# Patient Record
Sex: Female | Born: 1995 | Race: White | Hispanic: No | Marital: Married | Smoking: Never smoker
Health system: Southern US, Community
[De-identification: ages and names within clinical notes are randomized; demographics above are authoritative.]

## PROBLEM LIST (undated history)

## (undated) ENCOUNTER — Emergency Department: Discharge status: Absent without leave | Payer: BC Managed Care – PPO

## (undated) DIAGNOSIS — E538 Deficiency of other specified B group vitamins: Secondary | ICD-10-CM

## (undated) DIAGNOSIS — N9489 Other specified conditions associated with female genital organs and menstrual cycle: Secondary | ICD-10-CM

## (undated) DIAGNOSIS — K219 Gastro-esophageal reflux disease without esophagitis: Secondary | ICD-10-CM

## (undated) DIAGNOSIS — N12 Tubulo-interstitial nephritis, not specified as acute or chronic: Secondary | ICD-10-CM

## (undated) DIAGNOSIS — J069 Acute upper respiratory infection, unspecified: Secondary | ICD-10-CM

## (undated) DIAGNOSIS — F419 Anxiety disorder, unspecified: Secondary | ICD-10-CM

## (undated) DIAGNOSIS — J45909 Unspecified asthma, uncomplicated: Secondary | ICD-10-CM

## (undated) DIAGNOSIS — M84353A Stress fracture, unspecified femur, initial encounter for fracture: Secondary | ICD-10-CM

## (undated) DIAGNOSIS — G43909 Migraine, unspecified, not intractable, without status migrainosus: Secondary | ICD-10-CM

## (undated) DIAGNOSIS — Q6589 Other specified congenital deformities of hip: Secondary | ICD-10-CM

## (undated) DIAGNOSIS — L509 Urticaria, unspecified: Secondary | ICD-10-CM

## (undated) DIAGNOSIS — F909 Attention-deficit hyperactivity disorder, unspecified type: Secondary | ICD-10-CM

## (undated) HISTORY — PX: OTHER SURGICAL HISTORY: SHX169

## (undated) HISTORY — PX: TYMPANOSTOMY TUBE PLACEMENT: SHX32

## (undated) HISTORY — PX: TONSILLECTOMY: SUR1361

## (undated) HISTORY — DX: Acute upper respiratory infection, unspecified: J06.9

## (undated) HISTORY — PX: ADENOIDECTOMY: SUR15

## (undated) HISTORY — PX: HIP SURGERY: SHX245

## (undated) HISTORY — DX: Other specified conditions associated with female genital organs and menstrual cycle: N94.89

## (undated) HISTORY — DX: Unspecified asthma, uncomplicated: J45.909

## (undated) HISTORY — DX: Urticaria, unspecified: L50.9

---
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
## 2017-02-15 ENCOUNTER — Other Ambulatory Visit: Payer: Self-pay | Admitting: Obstetrics and Gynecology

## 2017-05-16 ENCOUNTER — Encounter: Payer: Self-pay | Admitting: Emergency Medicine

## 2017-05-16 ENCOUNTER — Inpatient Hospital Stay
Admission: EM | Admit: 2017-05-16 | Discharge: 2017-05-20 | DRG: 872 | Disposition: A | Discharge status: Home / Self Care | Payer: BC Managed Care – PPO | Attending: Internal Medicine | Admitting: Internal Medicine

## 2017-05-16 ENCOUNTER — Emergency Department: Payer: BC Managed Care – PPO

## 2017-05-16 DIAGNOSIS — Z7951 Long term (current) use of inhaled steroids: Secondary | ICD-10-CM

## 2017-05-16 DIAGNOSIS — J45909 Unspecified asthma, uncomplicated: Secondary | ICD-10-CM | POA: Diagnosis present

## 2017-05-16 DIAGNOSIS — Z79899 Other long term (current) drug therapy: Secondary | ICD-10-CM

## 2017-05-16 DIAGNOSIS — Z833 Family history of diabetes mellitus: Secondary | ICD-10-CM

## 2017-05-16 DIAGNOSIS — T8089XA Other complications following infusion, transfusion and therapeutic injection, initial encounter: Secondary | ICD-10-CM | POA: Diagnosis not present

## 2017-05-16 DIAGNOSIS — Z793 Long term (current) use of hormonal contraceptives: Secondary | ICD-10-CM

## 2017-05-16 DIAGNOSIS — A419 Sepsis, unspecified organism: Secondary | ICD-10-CM | POA: Diagnosis not present

## 2017-05-16 DIAGNOSIS — Z9089 Acquired absence of other organs: Secondary | ICD-10-CM

## 2017-05-16 DIAGNOSIS — N10 Acute tubulo-interstitial nephritis: Secondary | ICD-10-CM | POA: Diagnosis present

## 2017-05-16 DIAGNOSIS — Y711 Therapeutic (nonsurgical) and rehabilitative cardiovascular devices associated with adverse incidents: Secondary | ICD-10-CM | POA: Diagnosis present

## 2017-05-16 DIAGNOSIS — K5900 Constipation, unspecified: Secondary | ICD-10-CM | POA: Diagnosis present

## 2017-05-16 DIAGNOSIS — F419 Anxiety disorder, unspecified: Secondary | ICD-10-CM | POA: Diagnosis present

## 2017-05-16 DIAGNOSIS — R109 Unspecified abdominal pain: Secondary | ICD-10-CM

## 2017-05-16 DIAGNOSIS — Z880 Allergy status to penicillin: Secondary | ICD-10-CM

## 2017-05-16 DIAGNOSIS — N12 Tubulo-interstitial nephritis, not specified as acute or chronic: Secondary | ICD-10-CM | POA: Diagnosis present

## 2017-05-16 DIAGNOSIS — Y92230 Patient room in hospital as the place of occurrence of the external cause: Secondary | ICD-10-CM | POA: Diagnosis present

## 2017-05-16 DIAGNOSIS — F909 Attention-deficit hyperactivity disorder, unspecified type: Secondary | ICD-10-CM | POA: Diagnosis present

## 2017-05-16 DIAGNOSIS — G43909 Migraine, unspecified, not intractable, without status migrainosus: Secondary | ICD-10-CM | POA: Diagnosis present

## 2017-05-16 DIAGNOSIS — N1330 Unspecified hydronephrosis: Secondary | ICD-10-CM

## 2017-05-16 HISTORY — DX: Migraine, unspecified, not intractable, without status migrainosus: G43.909

## 2017-05-16 HISTORY — DX: Anxiety disorder, unspecified: F41.9

## 2017-05-16 HISTORY — DX: Attention-deficit hyperactivity disorder, unspecified type: F90.9

## 2017-05-16 LAB — COMPREHENSIVE METABOLIC PANEL
ALBUMIN: 3.9 g/dL (ref 3.5–5.0)
ALK PHOS: 57 U/L (ref 38–126)
ALT: 13 U/L — ABNORMAL LOW (ref 14–54)
ANION GAP: 7 (ref 5–15)
AST: 17 U/L (ref 15–41)
BILIRUBIN TOTAL: 0.3 mg/dL (ref 0.3–1.2)
BUN: 11 mg/dL (ref 6–20)
CALCIUM: 8.9 mg/dL (ref 8.9–10.3)
CO2: 26 mmol/L (ref 22–32)
Chloride: 105 mmol/L (ref 101–111)
Creatinine, Ser: 0.66 mg/dL (ref 0.44–1.00)
GFR calc non Af Amer: >60 mL/min (ref >60)
GLUCOSE: 89 mg/dL (ref 65–99)
Potassium: 3.6 mmol/L (ref 3.5–5.1)
Sodium: 138 mmol/L (ref 135–145)
TOTAL PROTEIN: 7.5 g/dL (ref 6.5–8.1)

## 2017-05-16 LAB — URINALYSIS, COMPLETE (UACMP) WITH MICROSCOPIC
Bilirubin Urine: NEGATIVE
Glucose, UA: NEGATIVE mg/dL
KETONES UR: NEGATIVE mg/dL
Leukocytes, UA: NEGATIVE
NITRITE: NEGATIVE
PH: 7 (ref 5.0–8.0)
PROTEIN: NEGATIVE mg/dL
SPECIFIC GRAVITY, URINE: 1.013 (ref 1.005–1.030)

## 2017-05-16 LAB — CBC
HCT: 34.7 % — ABNORMAL LOW (ref 35.0–47.0)
HEMOGLOBIN: 11.6 g/dL — AB (ref 12.0–16.0)
MCH: 29.7 pg (ref 26.0–34.0)
MCHC: 33.5 g/dL (ref 32.0–36.0)
MCV: 88.6 fL (ref 80.0–100.0)
Platelets: 217 10*3/uL (ref 150–440)
RBC: 3.92 MIL/uL (ref 3.80–5.20)
RDW: 13.7 % (ref 11.5–14.5)
WBC: 9.9 10*3/uL (ref 3.6–11.0)

## 2017-05-16 LAB — POCT PREGNANCY, URINE: PREG TEST UR: NEGATIVE

## 2017-05-16 LAB — LIPASE, BLOOD: Lipase: 22 U/L (ref 11–51)

## 2017-05-16 MED ORDER — ONDANSETRON 4 MG PO TBDP
4.0000 mg | ORAL_TABLET | Freq: Once | ORAL | Status: AC
  Administered 2017-05-16: 4 mg via ORAL

## 2017-05-16 MED ORDER — OXYCODONE-ACETAMINOPHEN 5-325 MG PO TABS
1.0000 | ORAL_TABLET | ORAL | Status: AC | PRN
  Administered 2017-05-16 (×2): 1 via ORAL
  Filled 2017-05-16 (×2): qty 1

## 2017-05-16 MED ORDER — DICYCLOMINE HCL 10 MG/ML IM SOLN
20.0000 mg | Freq: Once | INTRAMUSCULAR | Status: AC
  Administered 2017-05-16: 20 mg via INTRAMUSCULAR
  Filled 2017-05-16: qty 2

## 2017-05-16 MED ORDER — ONDANSETRON 4 MG PO TBDP
4.0000 mg | ORAL_TABLET | Freq: Once | ORAL | Status: AC | PRN
  Administered 2017-05-16: 4 mg via ORAL
  Filled 2017-05-16: qty 1

## 2017-05-16 MED ORDER — ONDANSETRON 4 MG PO TBDP
ORAL_TABLET | ORAL | Status: AC
  Filled 2017-05-16: qty 1

## 2017-05-16 NOTE — ED Triage Notes (Signed)
Patient with complaint of intermittent right lower abdominal pain that started on Sunday. Patient reports vomiting times one this morning. Patient denies diarrhea or urinary symptoms.

## 2017-05-16 NOTE — ED Provider Notes (Signed)
[facility] Emergency Department Provider Note   ____________________________________________   I have reviewed the triage vital signs and the nursing notes.   HISTORY  Chief Complaint Abdominal Pain   History limited by: Not Limited   HPI Angela Rocha is a 22 y.o. female who presents to the emergency department today because of concern for right sided abdominal pain. Patient states that the pain started 2 days ago. Since that time it has waxed and waned. At the time of my examination the patient states that it is severe. The patient states she has had some associated nausea and vomiting. She denies any change in defecation or urination. No dysuria. No fevers.   Per medical record review patient has a history of asthma.   Past Medical History:  Diagnosis Date  . ADHD   . Anxiety   . Asthma   . Migraine   . Pelvic congestion syndrome     Patient Active Problem List   Diagnosis Date Noted  . 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 Vitamin D deficiency 01/12/2016  . B12 deficiency 01/12/2016  . 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 Quadriceps tendinitis 01/06/2015  . Femoral neck stress fracture 01/06/2015  . 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 Cephalalgia 03/12/2014    Past Surgical History:  Procedure Laterality Date  . HIP SURGERY    . TONSILLECTOMY      Prior to Admission medications   Medication Sig Start Date End Date Taking? Authorizing Provider  albuterol (PROVENTIL HFA;VENTOLIN HFA) 108 (90 Base) MCG/ACT inhaler Inhale 2 puffs into the lungs every 6 (six) hours as needed for wheezing or shortness of breath. 05/10/16   Collene Gobble, MD  budesonide-formoterol (SYMBICORT) 160-4.5 MCG/ACT inhaler Inhale 2 puffs into the lungs 2 (two) times daily. 05/12/16   Collene Gobble, MD  Butalbital-APAP-Caffeine [id number] MG capsule Reported on 02/04/2015 10/31/14   [provider]  cyanocobalamin (,VITAMIN B-12,) 1000 MCG/ML injection  Inject 1 mL (1,000 mcg total) into the muscle every 30 (thirty) days. 01/12/16   Shambley, Melody N, CNM  FLUoxetine (PROZAC) 10 MG tablet Take 10 mg by mouth daily.    [provider]  lisdexamfetamine (VYVANSE) 20 MG capsule Take by mouth. 12/12/14   [provider]  TAYTULLA 1-20 MG-MCG(24) CAPS TAKE 1 TABLET BY MOUTH DAILY. 02/15/17   Shambley, Melody N, CNM  tranexamic acid (LYSTEDA) 650 MG TABS tablet Take 2 tablets (1,300 mg total) by mouth 3 (three) times daily. Take during menses for a maximum of five days 01/07/16   Shambley, Melody N, CNM  Vitamin D, Ergocalciferol, (DRISDOL) 50000 units CAPS capsule Take 1 capsule (50,000 Units total) by mouth every 7 (seven) days. 01/12/16   Shambley, Melody N, CNM    Allergies Amoxicillin-pot clavulanate  No family history on file.  Social History Social History   Tobacco Use  . Smoking status: Never Smoker  . Smokeless tobacco: Never Used  Substance Use Topics  . Alcohol use: No    Alcohol/week: 0.0 oz  . Drug use: No    Review of Systems Constitutional: No fever/chills Eyes: No visual changes. ENT: No sore throat. Cardiovascular: Denies chest pain. Respiratory: Denies shortness of breath. Gastrointestinal: Positive for abdominal pain and nausea and vomiting. Genitourinary: Negative for dysuria. Musculoskeletal: Negative for back pain. Skin: Negative for rash. Neurological: Negative for headaches, focal weakness or numbness.  ____________________________________________   PHYSICAL EXAM:  VITAL SIGNS: ED Triage Vitals  Enc Vitals Group  BP 05/16/17 1936 100/63     Pulse Rate 05/16/17 1934 (!) 107     Resp 05/16/17 1934 18     Temp 05/16/17 1934 98.5 F (36.9 C)     Temp Source 05/16/17 1934 Oral     SpO2 05/16/17 1934 100 %     Weight 05/16/17 1935 130 lb (59 kg)     Height 05/16/17 1935 5\' 3"  (1.6 m)     Head Circumference --      Peak Flow --      Pain Score 05/16/17 1935 7   Constitutional:  Alert and oriented. Appears uncomfortable.  Eyes: Conjunctivae are normal.  ENT   Head: Normocephalic and atraumatic.   Nose: No congestion/rhinnorhea.   Mouth/Throat: Mucous membranes are moist.   Neck: No stridor. Hematological/Lymphatic/Immunilogical: No cervical lymphadenopathy. Cardiovascular: Normal rate, regular rhythm.  No murmurs, rubs, or gallops.  Respiratory: Normal respiratory effort without tachypnea nor retractions. Breath sounds are clear and equal bilaterally. No wheezes/rales/rhonchi. Gastrointestinal: Soft and tender in the right upper abdomen.  Genitourinary: Deferred Musculoskeletal: Normal range of motion in all extremities. No lower extremity edema. Neurologic:  Normal speech and language. No gross focal neurologic deficits are appreciated.  Skin:  Skin is warm, dry and intact. No rash noted. Psychiatric: Mood and affect are normal. Speech and behavior are normal. Patient exhibits appropriate insight and judgment.  ____________________________________________    LABS (pertinent positives/negatives)  Upreg negative Lipase 22 CMP wnl except ALT 13 UA clear, moderate urine, negative leukocytes, 11-20 rbcs, 6-10 wbcs few bacteria ____________________________________________   EKG  None  ____________________________________________    RADIOLOGY  [place] pending at time of sign out  ____________________________________________   PROCEDURES  Procedures  ____________________________________________   INITIAL IMPRESSION / ASSESSMENT AND PLAN / ED COURSE  Pertinent labs & imaging results that were available during my care of the patient were reviewed by me and considered in my medical decision making (see chart for details).  Patient presented to the emergency department today because of concerns for right sided abdominal pain.  Has been present for the past 2 days.  On exam the patient is tender in the right side of her abdomen however is  more tender in the upper right side in the lower right side.  Patient is afebrile and without leukocytosis on blood work.  Differentials would include appendicitis, cholycystitis or biliary colic, gastritis, urinary tract infection amongst other etiologies.  Had discussion with patient and family.  At this point given that the pain is primarily located in the right upper quadrant is associated with nausea vomiting will start with ultrasound.  Did discuss the possibility of kind CT scan if that was negative and patient continued to have pain after medication.   ____________________________________________   FINAL CLINICAL IMPRESSION(S) / ED DIAGNOSES  Right upper abdominal pain  Note: This dictation was prepared with Dragon dictation. Any transcriptional errors that result from this process are unintentional      Nance Pear, MD 05/17/17 [phone]

## 2017-05-17 ENCOUNTER — Emergency Department: Payer: BC Managed Care – PPO

## 2017-05-17 ENCOUNTER — Other Ambulatory Visit: Payer: Self-pay

## 2017-05-17 DIAGNOSIS — T8089XA Other complications following infusion, transfusion and therapeutic injection, initial encounter: Secondary | ICD-10-CM | POA: Diagnosis not present

## 2017-05-17 DIAGNOSIS — F909 Attention-deficit hyperactivity disorder, unspecified type: Secondary | ICD-10-CM | POA: Diagnosis present

## 2017-05-17 DIAGNOSIS — Y711 Therapeutic (nonsurgical) and rehabilitative cardiovascular devices associated with adverse incidents: Secondary | ICD-10-CM | POA: Diagnosis present

## 2017-05-17 DIAGNOSIS — F419 Anxiety disorder, unspecified: Secondary | ICD-10-CM | POA: Diagnosis present

## 2017-05-17 DIAGNOSIS — Z880 Allergy status to penicillin: Secondary | ICD-10-CM | POA: Diagnosis not present

## 2017-05-17 DIAGNOSIS — A419 Sepsis, unspecified organism: Secondary | ICD-10-CM | POA: Diagnosis present

## 2017-05-17 DIAGNOSIS — Z7951 Long term (current) use of inhaled steroids: Secondary | ICD-10-CM | POA: Diagnosis not present

## 2017-05-17 DIAGNOSIS — N12 Tubulo-interstitial nephritis, not specified as acute or chronic: Secondary | ICD-10-CM | POA: Diagnosis present

## 2017-05-17 DIAGNOSIS — G43909 Migraine, unspecified, not intractable, without status migrainosus: Secondary | ICD-10-CM | POA: Diagnosis present

## 2017-05-17 DIAGNOSIS — Y92230 Patient room in hospital as the place of occurrence of the external cause: Secondary | ICD-10-CM | POA: Diagnosis present

## 2017-05-17 DIAGNOSIS — Z793 Long term (current) use of hormonal contraceptives: Secondary | ICD-10-CM | POA: Diagnosis not present

## 2017-05-17 DIAGNOSIS — K5900 Constipation, unspecified: Secondary | ICD-10-CM | POA: Diagnosis present

## 2017-05-17 DIAGNOSIS — Z833 Family history of diabetes mellitus: Secondary | ICD-10-CM | POA: Diagnosis not present

## 2017-05-17 DIAGNOSIS — N10 Acute tubulo-interstitial nephritis: Secondary | ICD-10-CM | POA: Diagnosis present

## 2017-05-17 DIAGNOSIS — J45909 Unspecified asthma, uncomplicated: Secondary | ICD-10-CM | POA: Diagnosis present

## 2017-05-17 DIAGNOSIS — Z79899 Other long term (current) drug therapy: Secondary | ICD-10-CM | POA: Diagnosis not present

## 2017-05-17 DIAGNOSIS — Z9089 Acquired absence of other organs: Secondary | ICD-10-CM | POA: Diagnosis not present

## 2017-05-17 LAB — GLUCOSE, CAPILLARY: Glucose-Capillary: 86 mg/dL (ref 65–99)

## 2017-05-17 LAB — TSH: TSH: 1.345 u[IU]/mL (ref 0.350–4.500)

## 2017-05-17 MED ORDER — IOPAMIDOL (ISOVUE-300) INJECTION 61%
30.0000 mL | Freq: Once | INTRAVENOUS | Status: AC
  Administered 2017-05-17: 30 mL via ORAL

## 2017-05-17 MED ORDER — VITAMIN D (ERGOCALCIFEROL) 1.25 MG (50000 UNIT) PO CAPS
50000.0000 [IU] | ORAL_CAPSULE | ORAL | Status: DC
  Administered 2017-05-19: 50000 [IU] via ORAL
  Filled 2017-05-17: qty 1

## 2017-05-17 MED ORDER — NORTRIPTYLINE HCL 10 MG PO CAPS
20.0000 mg | ORAL_CAPSULE | Freq: Every day | ORAL | Status: DC
  Administered 2017-05-17 – 2017-05-19 (×3): 20 mg via ORAL
  Filled 2017-05-17 (×4): qty 2

## 2017-05-17 MED ORDER — ONDANSETRON HCL 4 MG/2ML IJ SOLN
4.0000 mg | Freq: Four times a day (QID) | INTRAMUSCULAR | Status: DC | PRN
  Administered 2017-05-20: 4 mg via INTRAVENOUS
  Filled 2017-05-17: qty 2

## 2017-05-17 MED ORDER — LORATADINE 10 MG PO TABS
10.0000 mg | ORAL_TABLET | Freq: Every day | ORAL | Status: DC
  Administered 2017-05-17 – 2017-05-20 (×4): 10 mg via ORAL
  Filled 2017-05-17 (×4): qty 1

## 2017-05-17 MED ORDER — FLUTICASONE FUROATE-VILANTEROL 200-25 MCG/INH IN AEPB
1.0000 | INHALATION_SPRAY | Freq: Every day | RESPIRATORY_TRACT | Status: DC
  Administered 2017-05-17 – 2017-05-20 (×4): 1 via RESPIRATORY_TRACT
  Filled 2017-05-17: qty 28

## 2017-05-17 MED ORDER — SODIUM CHLORIDE 0.9 % IV BOLUS
1000.0000 mL | Freq: Once | INTRAVENOUS | Status: AC
  Administered 2017-05-17: 1000 mL via INTRAVENOUS

## 2017-05-17 MED ORDER — NORETHIN ACE-ETH ESTRAD-FE 1-20 MG-MCG(24) PO CAPS
1.0000 | ORAL_CAPSULE | Freq: Every day | ORAL | Status: DC
  Administered 2017-05-18 – 2017-05-19 (×2): 1 via ORAL

## 2017-05-17 MED ORDER — ENOXAPARIN SODIUM 40 MG/0.4ML ~~LOC~~ SOLN
40.0000 mg | SUBCUTANEOUS | Status: DC
  Filled 2017-05-17 (×5): qty 0.4

## 2017-05-17 MED ORDER — ALBUTEROL SULFATE (2.5 MG/3ML) 0.083% IN NEBU: 2.5000 mg | INHALATION_SOLUTION | RESPIRATORY_TRACT | Status: DC | PRN

## 2017-05-17 MED ORDER — MORPHINE SULFATE (PF) 4 MG/ML IV SOLN
4.0000 mg | Freq: Once | INTRAVENOUS | Status: AC
  Administered 2017-05-17: 4 mg via INTRAVENOUS

## 2017-05-17 MED ORDER — MORPHINE SULFATE (PF) 4 MG/ML IV SOLN
INTRAVENOUS | Status: AC
  Filled 2017-05-17: qty 1

## 2017-05-17 MED ORDER — IOPAMIDOL (ISOVUE-370) INJECTION 76%
75.0000 mL | Freq: Once | INTRAVENOUS | Status: AC | PRN
  Administered 2017-05-17: 75 mL via INTRAVENOUS

## 2017-05-17 MED ORDER — RIZATRIPTAN BENZOATE 10 MG PO TBDP
10.0000 mg | ORAL_TABLET | ORAL | Status: DC
  Filled 2017-05-17: qty 1

## 2017-05-17 MED ORDER — LEVOFLOXACIN IN D5W 500 MG/100ML IV SOLN
500.0000 mg | Freq: Once | INTRAVENOUS | Status: AC
  Administered 2017-05-17: 500 mg via INTRAVENOUS
  Filled 2017-05-17: qty 100

## 2017-05-17 MED ORDER — TRANEXAMIC ACID 650 MG PO TABS
1300.0000 mg | ORAL_TABLET | Freq: Three times a day (TID) | ORAL | Status: DC
  Administered 2017-05-17 – 2017-05-18 (×5): 1300 mg via ORAL
  Filled 2017-05-17 (×12): qty 2

## 2017-05-17 MED ORDER — SODIUM CHLORIDE 0.9 % IV SOLN
INTRAVENOUS | Status: DC
  Administered 2017-05-17 (×2): via INTRAVENOUS

## 2017-05-17 MED ORDER — ACETAMINOPHEN 650 MG RE SUPP: 650.0000 mg | Freq: Four times a day (QID) | RECTAL | Status: DC | PRN

## 2017-05-17 MED ORDER — SUMATRIPTAN SUCCINATE 50 MG PO TABS
100.0000 mg | ORAL_TABLET | ORAL | Status: DC | PRN
  Administered 2017-05-17: 100 mg via ORAL
  Filled 2017-05-17: qty 2

## 2017-05-17 MED ORDER — FLUOXETINE HCL 20 MG PO CAPS
20.0000 mg | ORAL_CAPSULE | Freq: Every day | ORAL | Status: DC
  Administered 2017-05-17 – 2017-05-20 (×4): 20 mg via ORAL
  Filled 2017-05-17 (×4): qty 1

## 2017-05-17 MED ORDER — DOCUSATE SODIUM 100 MG PO CAPS
100.0000 mg | ORAL_CAPSULE | Freq: Two times a day (BID) | ORAL | Status: DC
  Administered 2017-05-17 – 2017-05-19 (×6): 100 mg via ORAL
  Filled 2017-05-17 (×7): qty 1

## 2017-05-17 MED ORDER — MORPHINE SULFATE (PF) 4 MG/ML IV SOLN
4.0000 mg | Freq: Once | INTRAVENOUS | Status: AC
  Administered 2017-05-17: 4 mg via INTRAVENOUS
  Filled 2017-05-17: qty 1

## 2017-05-17 MED ORDER — ACETAMINOPHEN 325 MG PO TABS
650.0000 mg | ORAL_TABLET | Freq: Four times a day (QID) | ORAL | Status: DC | PRN
  Administered 2017-05-17 – 2017-05-18 (×4): 650 mg via ORAL
  Filled 2017-05-17 (×4): qty 2

## 2017-05-17 MED ORDER — IOPAMIDOL (ISOVUE-300) INJECTION 61%: 30.0000 mL | Freq: Once | INTRAVENOUS | Status: DC | PRN

## 2017-05-17 MED ORDER — MORPHINE SULFATE (PF) 2 MG/ML IV SOLN
2.0000 mg | INTRAVENOUS | Status: DC | PRN
  Administered 2017-05-17 (×4): 2 mg via INTRAVENOUS
  Filled 2017-05-17 (×4): qty 1

## 2017-05-17 MED ORDER — HYDROCODONE-ACETAMINOPHEN 5-325 MG PO TABS
1.0000 | ORAL_TABLET | ORAL | Status: DC | PRN
  Administered 2017-05-18 – 2017-05-19 (×4): 1 via ORAL
  Filled 2017-05-17 (×4): qty 1

## 2017-05-17 MED ORDER — ONDANSETRON HCL 4 MG PO TABS: 4.0000 mg | ORAL_TABLET | Freq: Four times a day (QID) | ORAL | Status: DC | PRN

## 2017-05-17 MED ORDER — LEVOFLOXACIN IN D5W 750 MG/150ML IV SOLN
750.0000 mg | INTRAVENOUS | Status: DC
  Administered 2017-05-18 – 2017-05-19 (×2): 750 mg via INTRAVENOUS
  Filled 2017-05-17 (×2): qty 150

## 2017-05-17 MED ORDER — KETOROLAC TROMETHAMINE 30 MG/ML IJ SOLN
15.0000 mg | Freq: Once | INTRAMUSCULAR | Status: AC
  Administered 2017-05-17: 15 mg via INTRAVENOUS
  Filled 2017-05-17: qty 1

## 2017-05-17 MED ORDER — RIZATRIPTAN BENZOATE 10 MG PO TABS
10.0000 mg | ORAL_TABLET | Freq: Once | ORAL | Status: DC | PRN
  Filled 2017-05-17: qty 1

## 2017-05-17 NOTE — H&P (Signed)
Angela Rocha is an 22 y.o. female.   Chief Complaint: Abdominal pain HPI: The patient with past medical history of asthma and ADHD as well as hip joint arthritis and pelvic congestion syndrome presents to the emergency department complaining of abdominal pain.  The pain has worsened over the last few days.  The patient was diagnosed with a UTI last week placed on antibiotics.  CT of her abdomen revealed pyelonephritis.  Patient received multiple doses of pain medicine without significant relief which prompted the emergency department staff to call the hospitalist service for admission.  Past Medical History:  Diagnosis Date  . ADHD   . Anxiety   . Asthma   . Migraine   . Pelvic congestion syndrome     Past Surgical History:  Procedure Laterality Date  . HIP SURGERY    . TONSILLECTOMY      Family History  Problem Relation Age of Onset  . Diabetes Mellitus II Father    Social History:  reports that she has never smoked. She has never used smokeless tobacco. She reports that she does not drink alcohol or use drugs.  Allergies:  Allergies  Allergen Reactions  . Amoxicillin-Pot Clavulanate Rash and Other (See Comments)    Has patient had a PCN reaction causing immediate rash, facial/tongue/throat swelling, SOB or lightheadedness with hypotension: Yes Has patient had a PCN reaction causing severe rash involving mucus membranes or skin necrosis: No Has patient had a PCN reaction that required hospitalization: No Has patient had a PCN reaction occurring within the last 10 years: No If all of the above answers are "NO", then may proceed with Cephalosporin use.     Medications Prior to Admission  Medication Sig Dispense Refill  . albuterol (PROVENTIL HFA;VENTOLIN HFA) 108 (90 Base) MCG/ACT inhaler Inhale 2 puffs into the lungs every 6 (six) hours as needed for wheezing or shortness of breath. 1 Inhaler 4  . budesonide-formoterol (SYMBICORT) 160-4.5 MCG/ACT inhaler Inhale 2 puffs into  the lungs 2 (two) times daily. 1 Inhaler 5  . cetirizine (ZYRTEC) 10 MG tablet Take 10 mg by mouth daily.  3  . cyanocobalamin (,VITAMIN B-12,) 1000 MCG/ML injection Inject 1 mL (1,000 mcg total) into the muscle every 30 (thirty) days. 10 mL 1  . dexmethylphenidate (FOCALIN) 2.5 MG tablet Take 2.5 mg by mouth daily.    Marland Kitchen FLUoxetine (PROZAC) 20 MG capsule Take 20 mg by mouth daily.   0  . lisdexamfetamine (VYVANSE) 20 MG capsule Take 20 mg by mouth daily.     . nortriptyline (PAMELOR) 10 MG capsule Take 20 mg by mouth at bedtime.  3  . rizatriptan (MAXALT-MLT) 10 MG disintegrating tablet Take 10 mg by mouth as directed. TAKE 1 TAB AT HEADACHE ONSET MAY TAKE A 2ND DOSE AFTER 2 HOURS IF NEEDED NO MORE THAN 2 DOSES/24 HRS  3  . TAYTULLA 1-20 MG-MCG(24) CAPS TAKE 1 TABLET BY MOUTH DAILY. 28 capsule 11  . tranexamic acid (LYSTEDA) 650 MG TABS tablet Take 2 tablets (1,300 mg total) by mouth 3 (three) times daily. Take during menses for a maximum of five days 30 tablet 2  . Vitamin D, Ergocalciferol, (DRISDOL) 50000 units CAPS capsule Take 1 capsule (50,000 Units total) by mouth every 7 (seven) days. (Patient taking differently: Take 50,000 Units by mouth every 7 (seven) days. On Friday) 30 capsule 1    Results for orders placed or performed during the hospital encounter of 05/16/17 (from the past 48 hour(s))  Lipase, blood  Status: None   Collection Time: 05/16/17  7:39 PM  Result Value Ref Range   Lipase 22 11 - 51 U/L    Comment: Performed at [facility], [address] [place], [place] [address]  Comprehensive metabolic panel     Status: Abnormal   Collection Time: 05/16/17  7:39 PM  Result Value Ref Range   Sodium 138 135 - 145 mmol/L   Potassium 3.6 3.5 - 5.1 mmol/L   Chloride 105 101 - 111 mmol/L   CO2 26 22 - 32 mmol/L   Glucose, Bld 89 65 - 99 mg/dL   BUN 11 6 - 20 mg/dL   Creatinine, Ser 0.66 0.44 - 1.00 mg/dL   Calcium 8.9 8.9 - 10.3 mg/dL   Total Protein 7.5 6.5 - 8.1  g/dL   Albumin 3.9 3.5 - 5.0 g/dL   AST 17 15 - 41 U/L   ALT 13 (L) 14 - 54 U/L   Alkaline Phosphatase 57 38 - 126 U/L   Total Bilirubin 0.3 0.3 - 1.2 mg/dL   GFR calc non Af Amer >60 >60 mL/min   GFR calc Af Amer >60 >60 mL/min    Comment: (NOTE) The eGFR has been calculated using the CKD EPI equation. This calculation has not been validated in all clinical situations. eGFR's persistently <60 mL/min signify possible Chronic Kidney Disease.    Anion gap 7 5 - 15    Comment: Performed at [facility], [address] [place], [place] [address]  CBC     Status: Abnormal   Collection Time: 05/16/17  7:39 PM  Result Value Ref Range   WBC 9.9 3.6 - 11.0 K/uL   RBC 3.92 3.80 - 5.20 MIL/uL   Hemoglobin 11.6 (L) 12.0 - 16.0 g/dL   HCT 34.7 (L) 35.0 - 47.0 %   MCV 88.6 80.0 - 100.0 fL   MCH 29.7 26.0 - 34.0 pg   MCHC 33.5 32.0 - 36.0 g/dL   RDW 13.7 11.5 - 14.5 %   Platelets 217 150 - 440 K/uL    Comment: Performed at [facility], [address] [place], [place] [address]  Urinalysis, Complete w Microscopic     Status: Abnormal   Collection Time: 05/16/17  7:39 PM  Result Value Ref Range   Color, Urine YELLOW (A) YELLOW   APPearance CLEAR (A) CLEAR   Specific Gravity, Urine 1.013 1.005 - 1.030   pH 7.0 5.0 - 8.0   Glucose, UA NEGATIVE NEGATIVE mg/dL   Hgb urine dipstick MODERATE (A) NEGATIVE   Bilirubin Urine NEGATIVE NEGATIVE   Ketones, ur NEGATIVE NEGATIVE mg/dL   Protein, ur NEGATIVE NEGATIVE mg/dL   Nitrite NEGATIVE NEGATIVE   Leukocytes, UA NEGATIVE NEGATIVE   RBC / HPF 11-20 0 - 5 RBC/hpf   WBC, UA 6-10 0 - 5 WBC/hpf   Bacteria, UA FEW (A) NONE SEEN   Squamous Epithelial / LPF 0-5 0 - 5    Comment: Please note change in reference range. Performed at [facility], [address] [place], [place] [address]   Pregnancy, urine POC     Status: None   Collection Time: 05/16/17  7:51 PM  Result Value Ref Range   Preg Test, Ur NEGATIVE  NEGATIVE    Comment:        THE SENSITIVITY OF THIS METHODOLOGY IS >24 mIU/mL    Ct Abdomen Pelvis W Contrast  Result Date: 05/17/2017 CLINICAL DATA:  Initial evaluation for acute abdominal pain,  appendicitis suspected. EXAM: CT ABDOMEN AND PELVIS WITH CONTRAST TECHNIQUE: Multidetector CT imaging of the abdomen and pelvis was performed using the standard protocol following bolus administration of intravenous contrast. CONTRAST:  76m ISOVUE-370 IOPAMIDOL (ISOVUE-370) INJECTION 76% COMPARISON:  Prior ultrasound from 05/16/2017. FINDINGS: Lower chest: Minimal atelectatic changes present within the lingula. Visualized lung bases are otherwise clear. Hepatobiliary: Liver demonstrates a normal contrast enhanced appearance. Gallbladder normal. No biliary dilatation. Pancreas: Pancreas normal. Spleen: Normal. Adrenals/Urinary Tract: Adrenal glands are normal. There is slightly delayed and striated nephrogram within the right kidney is compared to the left with hazy perinephric and periureteral fat stranding. Right renal collecting system slightly dilated with associated mucosal enhancement. Findings consistent with acute pyelonephritis. No intrarenal abscess or other collection. No obstructive stone identified. Left kidney and renal collecting system are within normal limits. Bladder partially distended. Circumferential bladder wall thickening may be related incomplete distension and/or acute cystitis. No layering stones within the bladder lumen. Stomach/Bowel: Stomach within normal limits. No evidence for bowel obstruction. Appendix visualized within the right lower quadrant and is of normal caliber and appearance without associated inflammatory changes to suggest acute appendicitis. No acute inflammatory changes seen about the bowels. Vascular/Lymphatic: Normal intravascular enhancement seen throughout the intra-abdominal aorta and its branch vessels. No adenopathy. Reproductive: Uterus and ovaries within normal  limits for age. Other: No free air or fluid. Musculoskeletal: No acute osseous abnormality. No worrisome lytic or blastic osseous lesions. IMPRESSION: 1. Findings highly suggestive for acute pyelonephritis as above. Correlation with urinalysis recommended. No obstructive stone identified. 2. Circumferential bladder wall thickening. While this finding may be related to incomplete distension, possible acute cystitis could also be considered, particularly given the findings in the right kidney. 3. No other acute intra-abdominal or pelvic process. Normal appendix. Electronically Signed   By: BJeannine BogaM.D.   On: 05/17/2017 04:28   UKoreaAbdomen Limited Ruq  Result Date: 05/17/2017 CLINICAL DATA:  Initial evaluation for acute right upper and lower quadrant abdominal pain for 3 days. EXAM: ULTRASOUND ABDOMEN LIMITED RIGHT UPPER QUADRANT COMPARISON:  None. FINDINGS: Gallbladder: No gallstones or wall thickening visualized. No sonographic Murphy sign noted by sonographer. Common bile duct: Diameter: 2.1 mm Liver: No focal lesion identified. Within normal limits in parenchymal echogenicity. Portal vein is patent on color Doppler imaging with normal direction of blood flow towards the liver. IMPRESSION: Normal right upper quadrant ultrasound. Electronically Signed   By: BJeannine BogaM.D.   On: 05/17/2017 00:22    Review of Systems  Constitutional: Negative for chills and fever.  HENT: Negative for sore throat and tinnitus.   Eyes: Negative for blurred vision and redness.  Respiratory: Negative for cough and shortness of breath.   Cardiovascular: Negative for chest pain, palpitations, orthopnea and PND.  Gastrointestinal: Positive for abdominal pain. Negative for diarrhea, nausea and vomiting.  Genitourinary: Negative for dysuria, frequency and urgency.  Musculoskeletal: Negative for joint pain and myalgias.  Skin: Negative for rash.       No lesions  Neurological: Negative for speech change,  focal weakness and weakness.  Endo/Heme/Allergies: Does not bruise/bleed easily.       No temperature intolerance  Psychiatric/Behavioral: Negative for depression and suicidal ideas.    Blood pressure 108/65, pulse (!) 130, temperature 99.7 F (37.6 C), temperature source Oral, resp. rate 20, height '5\' 3"'  (1.6 m), weight 59 kg (130 lb), last menstrual period 05/11/2017, SpO2 98 %. Physical Exam  Vitals reviewed. Constitutional: She is oriented to person, place, and time.  She appears well-developed and well-nourished. No distress.  HENT:  Head: Normocephalic and atraumatic.  Mouth/Throat: Oropharynx is clear and moist.  Eyes: Pupils are equal, round, and reactive to light. Conjunctivae and EOM are normal. No scleral icterus.  Neck: Normal range of motion. Neck supple. No JVD present. No tracheal deviation present. No thyromegaly present.  Cardiovascular: Normal rate, regular rhythm and normal heart sounds. Exam reveals no gallop and no friction rub.  No murmur heard. Respiratory: Effort normal and breath sounds normal.  GI: Soft. Bowel sounds are normal. She exhibits no distension. There is no tenderness.  Genitourinary:  Genitourinary Comments: Deferred  Musculoskeletal: Normal range of motion. She exhibits no edema.  Lymphadenopathy:    She has no cervical adenopathy.  Neurological: She is alert and oriented to person, place, and time. No cranial nerve deficit. She exhibits normal muscle tone.  Skin: Skin is warm and dry. No rash noted. No erythema.  Psychiatric: She has a normal mood and affect. Her behavior is normal. Judgment and thought content normal.     Assessment/Plan This is a 22 year old female admitted for pyelonephritis. 1.  Pyelonephritis: Continue Levaquin.  Transition to p.o. when the patient is here to septicemia. 2.  Sepsis: Patient meets criteria via fever and tachycardia.  Follow blood cultures and urine cultures for growth and sensitivities.  The patient is  hemodynamically stable. 3.  Abdominal pain: Intractable; IV morphine for severe pain 4.  DVT prophylaxis: Lovenox 5.  GI prophylaxis: None The patient is a full code.  Time spent on admission orders and patient care approximately 45 minutes  Harrie Foreman, MD 05/17/2017, 7:40 AM

## 2017-05-17 NOTE — ED Notes (Signed)
Patient transported to CT at this time. 

## 2017-05-17 NOTE — ED Provider Notes (Signed)
-----------------------------------------   12:39 AM on 05/17/2017 -----------------------------------------  Patient continues to have discomfort moderate pain in the right lower quadrant.  Remains tachycardic around 115 bpm.  Ultrasound is negative.  On my examination patient has moderate tenderness in the right lower quadrant and periumbilical area.  Patient's lab work is largely within normal limits.  Discussed with the patient and family present cons of obtaining CT scan, they are agreeable to proceed with CT imaging.  We will place an IV, IV hydrate, treat pain and obtain a CT scan to further evaluate.  Patient agreeable to plan of care.  In reviewing the patient's lab work patient does have red cells and white cells within the urine.  Does not appear to be a significant urinary tract infection but I have added on a urine culture.  We will likely treat with antibiotics regardless.   CT consistent with pyelonephritis.  Place the patient on IV Levaquin.  Continues to state 8/10 pain despite 2 rounds of IV pain medication.  Febrile to 100.8 initially tachycardic 120.  Patient will be admitted to the hospitalist service for further treatment.   Harvest Dark, MD 05/17/17 [phone]

## 2017-05-17 NOTE — Progress Notes (Signed)
[place] at [facility]                                                                                                                                                                                  Patient Demographics   Angela Rocha, is a 22 y.o. female, DOB - [birth year], WCB:[id number]  Admit date - 05/16/2017   Admitting Physician Harrie Foreman, MD  Outpatient Primary MD for the patient is Wayland Denis, PA-C   LOS - 0  Subjective:  Patient states abdominal pain is better denies any nausea vomiting   Review of Systems:   CONSTITUTIONAL: No documented fever. No fatigue, weakness. No weight gain, no weight loss.  EYES: No blurry or double vision.  ENT: No tinnitus. No postnasal drip. No redness of the oropharynx.  RESPIRATORY: No cough, no wheeze, no hemoptysis. No dyspnea.  CARDIOVASCULAR: No chest pain. No orthopnea. No palpitations. No syncope.  GASTROINTESTINAL: No nausea, no vomiting or diarrhea.  Positive abdominal pain. No melena or hematochezia.  GENITOURINARY: No dysuria or hematuria.  ENDOCRINE: No polyuria or nocturia. No heat or cold intolerance.  HEMATOLOGY: No anemia. No bruising. No bleeding.  INTEGUMENTARY: No rashes. No lesions.  MUSCULOSKELETAL: No arthritis. No swelling. No gout.  NEUROLOGIC: No numbness, tingling, or ataxia. No seizure-type activity.  PSYCHIATRIC: No anxiety. No insomnia. No ADD.    Vitals:   Vitals:   05/17/17 0255 05/17/17 0449 05/17/17 0551 05/17/17 1316  BP: (!) 100/53 109/63 108/65 (!) 97/55  Pulse: (!) 121 (!) 129 (!) 130 (!) 103  Resp: 16 18 20    Temp: (!) 100.8 F (38.2 C)  99.7 F (37.6 C) 99.3 F (37.4 C)  TempSrc: Oral  Oral Oral  SpO2: 100% 100% 98% 100%  Weight:      Height:        Wt Readings from Last 3 Encounters:  05/16/17 59 kg (130 lb) (54 %, Z= 0.11)*  09/09/16 57.6 kg (127 lb) (52 %, Z= 0.04)*  05/10/16 58.2 kg (128 lb 6.4 oz) (56 %, Z= 0.15)*   * Growth percentiles  are based on CDC (Girls, 2-20 Years) data.     Intake/Output Summary (Last 24 hours) at 05/17/2017 1400 Last data filed at 05/17/2017 1200 Gross per 24 hour  Intake 1869 ml  Output 300 ml  Net 1569 ml    Physical Exam:   GENERAL: Pleasant-appearing in no apparent distress.  HEAD, EYES, EARS, NOSE AND THROAT: Atraumatic, normocephalic. Extraocular muscles are intact. Pupils equal and reactive to light. Sclerae anicteric. No conjunctival injection. No oro-pharyngeal erythema.  NECK: Supple. There is no jugular venous distention. No bruits, no lymphadenopathy, no thyromegaly.  HEART: Regular rate and rhythm,. No murmurs, no rubs, no clicks.  LUNGS: Clear to auscultation bilaterally. No rales or rhonchi. No wheezes.  ABDOMEN: Soft, flat, nontender, nondistended. Has good bowel sounds. No hepatosplenomegaly appreciated.  EXTREMITIES: No evidence of any cyanosis, clubbing, or peripheral edema.  +2 pedal and radial pulses bilaterally.  NEUROLOGIC: The patient is alert, awake, and oriented x3 with no focal motor or sensory deficits appreciated bilaterally.  SKIN: Moist and warm with no rashes appreciated.  Psych: Not anxious, depressed LN: No inguinal LN enlargement    Antibiotics   Anti-infectives (From admission, onward)   Start     Dose/Rate Route Frequency Ordered Stop   05/18/17 0800  levofloxacin (LEVAQUIN) IVPB 750 mg     750 mg 100 mL/hr over 90 Minutes Intravenous Every 24 hours 05/17/17 1325     05/17/17 0445  levofloxacin (LEVAQUIN) IVPB 500 mg     500 mg 100 mL/hr over 60 Minutes Intravenous  Once 05/17/17 0438 05/17/17 0540      Medications   Scheduled Meds: . docusate sodium  100 mg Oral BID  . enoxaparin (LOVENOX) injection  40 mg Subcutaneous Q24H  . FLUoxetine  20 mg Oral Daily  . fluticasone furoate-vilanterol  1 puff Inhalation Daily  . loratadine  10 mg Oral Daily  . Norethin Ace-Eth Estrad-FE  1 tablet Oral Daily  . nortriptyline  20 mg Oral QHS  .  rizatriptan  10 mg Oral UD  . tranexamic acid  1,300 mg Oral TID  . [START ON 05/19/2017] Vitamin D (Ergocalciferol)  50,000 Units Oral Q7 days   Continuous Infusions: . sodium chloride 125 mL/hr at 05/17/17 0653  . [START ON 05/18/2017] levofloxacin (LEVAQUIN) IV     PRN Meds:.acetaminophen **OR** acetaminophen, albuterol, HYDROcodone-acetaminophen, morphine injection, ondansetron **OR** ondansetron (ZOFRAN) IV   Data Review:   Micro Results No results found for this or any previous visit (from the past 240 hour(s)).  Radiology Reports Ct Abdomen Pelvis W Contrast  Result Date: 05/17/2017 CLINICAL DATA:  Initial evaluation for acute abdominal pain, appendicitis suspected. EXAM: CT ABDOMEN AND PELVIS WITH CONTRAST TECHNIQUE: Multidetector CT imaging of the abdomen and pelvis was performed using the standard protocol following bolus administration of intravenous contrast. CONTRAST:  16mL ISOVUE-370 IOPAMIDOL (ISOVUE-370) INJECTION 76% COMPARISON:  Prior ultrasound from 05/16/2017. FINDINGS: Lower chest: Minimal atelectatic changes present within the lingula. Visualized lung bases are otherwise clear. Hepatobiliary: Liver demonstrates a normal contrast enhanced appearance. Gallbladder normal. No biliary dilatation. Pancreas: Pancreas normal. Spleen: Normal. Adrenals/Urinary Tract: Adrenal glands are normal. There is slightly delayed and striated nephrogram within the right kidney is compared to the left with hazy perinephric and periureteral fat stranding. Right renal collecting system slightly dilated with associated mucosal enhancement. Findings consistent with acute pyelonephritis. No intrarenal abscess or other collection. No obstructive stone identified. Left kidney and renal collecting system are within normal limits. Bladder partially distended. Circumferential bladder wall thickening may be related incomplete distension and/or acute cystitis. No layering stones within the bladder lumen.  Stomach/Bowel: Stomach within normal limits. No evidence for bowel obstruction. Appendix visualized within the right lower quadrant and is of normal caliber and appearance without associated inflammatory changes to suggest acute appendicitis. No acute inflammatory changes seen about the bowels. Vascular/Lymphatic: Normal intravascular enhancement seen throughout the intra-abdominal aorta and its branch vessels. No adenopathy. Reproductive: Uterus and ovaries within normal limits for age. Other: No free air or fluid. Musculoskeletal: No acute osseous abnormality. No worrisome lytic or blastic osseous  lesions. IMPRESSION: 1. Findings highly suggestive for acute pyelonephritis as above. Correlation with urinalysis recommended. No obstructive stone identified. 2. Circumferential bladder wall thickening. While this finding may be related to incomplete distension, possible acute cystitis could also be considered, particularly given the findings in the right kidney. 3. No other acute intra-abdominal or pelvic process. Normal appendix. Electronically Signed   By: Jeannine Boga M.D.   On: 05/17/2017 04:28   US Abdomen Limited Ruq  Result Date: 05/17/2017 CLINICAL DATA:  Initial evaluation for acute right upper and lower quadrant abdominal pain for 3 days. EXAM: ULTRASOUND ABDOMEN LIMITED RIGHT UPPER QUADRANT COMPARISON:  None. FINDINGS: Gallbladder: No gallstones or wall thickening visualized. No sonographic Murphy sign noted by sonographer. Common bile duct: Diameter: 2.1 mm Liver: No focal lesion identified. Within normal limits in parenchymal echogenicity. Portal vein is patent on color Doppler imaging with normal direction of blood flow towards the liver. IMPRESSION: Normal right upper quadrant ultrasound. Electronically Signed   By: Jeannine Boga M.D.   On: 05/17/2017 00:22     CBC Recent Labs  Lab 05/16/17 1939  WBC 9.9  HGB 11.6*  HCT 34.7*  PLT 217  MCV 88.6  MCH 29.7  MCHC 33.5  RDW  13.7    Chemistries  Recent Labs  Lab 05/16/17 1939  NA 138  K 3.6  CL 105  CO2 26  GLUCOSE 89  BUN 11  CREATININE 0.66  CALCIUM 8.9  AST 17  ALT 13*  ALKPHOS 57  BILITOT 0.3   ------------------------------------------------------------------------------------------------------------------ estimated creatinine clearance is 93.6 mL/min (by C-G formula based on SCr of 0.66 mg/dL). ------------------------------------------------------------------------------------------------------------------ No results for input(s): HGBA1C in the last 72 hours. ------------------------------------------------------------------------------------------------------------------ No results for input(s): CHOL, HDL, LDLCALC, TRIG, CHOLHDL, LDLDIRECT in the last 72 hours. ------------------------------------------------------------------------------------------------------------------ Recent Labs    05/16/17 1939  TSH 1.345   ------------------------------------------------------------------------------------------------------------------ No results for input(s): VITAMINB12, FOLATE, FERRITIN, TIBC, IRON, RETICCTPCT in the last 72 hours.  Coagulation profile No results for input(s): INR, PROTIME in the last 168 hours.  No results for input(s): DDIMER in the last 72 hours.  Cardiac Enzymes No results for input(s): CKMB, TROPONINI, MYOGLOBIN in the last 168 hours.  Invalid input(s): CK ------------------------------------------------------------------------------------------------------------------ Invalid input(s): [place]   This is a 22 year old female admitted for pyelonephritis. 1.  Pyelonephritis: I will restart Levaquin await blood and urine culture 2.  Sepsis: Patient meets criteria via fever and tachycardia.    Continue to monitor 3.  Abdominal pain: Improve continue morphine. 4.  DVT prophylaxis: Lovenox 5.  GI prophylaxis: None       Code Status Orders   (From admission, onward)        Start     Ordered   05/17/17 0547  Full code  Continuous     05/17/17 0546    Code Status History    This patient has a current code status but no historical code status.           Consults  none   DVT Prophylaxis  Lovenox    Lab Results  Component Value Date   PLT 217 05/16/2017     Time Spent in minutes   72min  Greater than 50% of time spent in care coordination and counseling patient regarding the condition and plan of care.   Dustin Flock M.D on 05/17/2017 at 2:00 PM  Between 7am to 6pm - Pager - [phone]  After 6pm go to [email or web address] - [place]  [employer]  [phone]

## 2017-05-18 LAB — CBC
HEMATOCRIT: 28.8 % — AB (ref 35.0–47.0)
HEMOGLOBIN: 9.8 g/dL — AB (ref 12.0–16.0)
MCH: 30.2 pg (ref 26.0–34.0)
MCHC: 34 g/dL (ref 32.0–36.0)
MCV: 88.7 fL (ref 80.0–100.0)
Platelets: 171 10*3/uL (ref 150–440)
RBC: 3.25 MIL/uL — ABNORMAL LOW (ref 3.80–5.20)
RDW: 13.9 % (ref 11.5–14.5)
WBC: 10 10*3/uL (ref 3.6–11.0)

## 2017-05-18 LAB — BASIC METABOLIC PANEL
Anion gap: 9 (ref 5–15)
BUN: 8 mg/dL (ref 6–20)
CHLORIDE: 110 mmol/L (ref 101–111)
CO2: 19 mmol/L — AB (ref 22–32)
Calcium: 7.8 mg/dL — ABNORMAL LOW (ref 8.9–10.3)
Creatinine, Ser: 0.77 mg/dL (ref 0.44–1.00)
GFR calc non Af Amer: >60 mL/min (ref >60)
Glucose, Bld: 107 mg/dL — ABNORMAL HIGH (ref 65–99)
Potassium: 3.4 mmol/L — ABNORMAL LOW (ref 3.5–5.1)
Sodium: 138 mmol/L (ref 135–145)

## 2017-05-18 MED ORDER — MORPHINE SULFATE (PF) 2 MG/ML IV SOLN
2.0000 mg | Freq: Once | INTRAVENOUS | Status: AC
  Administered 2017-05-18: 2 mg via INTRAVENOUS

## 2017-05-18 MED ORDER — MORPHINE SULFATE (PF) 4 MG/ML IV SOLN
INTRAVENOUS | Status: AC
  Filled 2017-05-18: qty 1

## 2017-05-18 MED ORDER — POTASSIUM CHLORIDE CRYS ER 20 MEQ PO TBCR
20.0000 meq | EXTENDED_RELEASE_TABLET | Freq: Once | ORAL | Status: AC
  Administered 2017-05-18: 20 meq via ORAL
  Filled 2017-05-18: qty 1

## 2017-05-18 MED ORDER — MORPHINE SULFATE (PF) 4 MG/ML IV SOLN: 4.0000 mg | INTRAVENOUS | Status: DC | PRN

## 2017-05-18 MED ORDER — MORPHINE SULFATE (PF) 2 MG/ML IV SOLN
2.0000 mg | INTRAVENOUS | Status: DC | PRN
  Administered 2017-05-18 – 2017-05-20 (×7): 2 mg via INTRAVENOUS
  Filled 2017-05-18 (×9): qty 1

## 2017-05-18 MED ORDER — MORPHINE SULFATE (PF) 2 MG/ML IV SOLN: 2.0000 mg | INTRAVENOUS | Status: DC | PRN

## 2017-05-18 NOTE — Progress Notes (Signed)
[place] at [facility]                                                                                                                                                                                  Patient Demographics   Angela Rocha, is a 22 y.o. female, DOB - 02-Aug-1995, KDX:[id number]  Admit date - 05/16/2017   Admitting Physician Harrie Foreman, MD  Outpatient Primary MD for the patient is Wayland Denis, PA-C   LOS - 1  Subjective:  Patient continues to have abdominal pain and had fever last night but not feeling now feeling better Complains of swelling in the right hand due to IV infiltrate   Review of Systems:   CONSTITUTIONAL: No documented fever. No fatigue, weakness. No weight gain, no weight loss.  EYES: No blurry or double vision.  ENT: No tinnitus. No postnasal drip. No redness of the oropharynx.  RESPIRATORY: No cough, no wheeze, no hemoptysis. No dyspnea.  CARDIOVASCULAR: No chest pain. No orthopnea. No palpitations. No syncope.  GASTROINTESTINAL: No nausea, no vomiting or diarrhea.  Positive abdominal pain. No melena or hematochezia.  GENITOURINARY: No dysuria or hematuria.  ENDOCRINE: No polyuria or nocturia. No heat or cold intolerance.  HEMATOLOGY: No anemia. No bruising. No bleeding.  INTEGUMENTARY: No rashes. No lesions.  MUSCULOSKELETAL: No arthritis. No swelling. No gout.  NEUROLOGIC: No numbness, tingling, or ataxia. No seizure-type activity.  PSYCHIATRIC: No anxiety. No insomnia. No ADD.    Vitals:   Vitals:   05/17/17 2322 05/18/17 0447 05/18/17 0500 05/18/17 1324  BP:  113/71  105/62  Pulse:  (!) 103  98  Resp:  17  (!) 22  Temp: (!) 101.3 F (38.5 C) 98.4 F (36.9 C)  97.7 F (36.5 C)  TempSrc:  Oral  Oral  SpO2:  93%  97%  Weight:   60.6 kg (133 lb 9.6 oz)   Height:        Wt Readings from Last 3 Encounters:  05/18/17 60.6 kg (133 lb 9.6 oz) (60 %, Z= 0.26)*  09/09/16 57.6 kg (127 lb) (52 %, Z= 0.04)*   05/10/16 58.2 kg (128 lb 6.4 oz) (56 %, Z= 0.15)*   * Growth percentiles are based on CDC (Girls, 2-20 Years) data.     Intake/Output Summary (Last 24 hours) at 05/18/2017 1428 Last data filed at 05/18/2017 1028 Gross per 24 hour  Intake 2248 ml  Output 1625 ml  Net 623 ml    Physical Exam:   GENERAL: Pleasant-appearing in no apparent distress.  HEAD, EYES, EARS, NOSE AND THROAT: Atraumatic, normocephalic. Extraocular muscles are intact. Pupils equal and reactive to light. Sclerae anicteric.  No conjunctival injection. No oro-pharyngeal erythema.  NECK: Supple. There is no jugular venous distention. No bruits, no lymphadenopathy, no thyromegaly.  HEART: Regular rate and rhythm,. No murmurs, no rubs, no clicks.  LUNGS: Clear to auscultation bilaterally. No rales or rhonchi. No wheezes.  ABDOMEN: Soft, flat, nontender, nondistended. Has good bowel sounds. No hepatosplenomegaly appreciated.  EXTREMITIES: No evidence of any cyanosis, clubbing, or peripheral edema.  +2 pedal and radial pulses bilaterally.  NEUROLOGIC: The patient is alert, awake, and oriented x3 with no focal motor or sensory deficits appreciated bilaterally.  SKIN: Moist and warm with no rashes appreciated.  Psych: Not anxious, depressed LN: No inguinal LN enlargement    Antibiotics   Anti-infectives (From admission, onward)   Start     Dose/Rate Route Frequency Ordered Stop   05/18/17 0800  levofloxacin (LEVAQUIN) IVPB 750 mg     750 mg 100 mL/hr over 90 Minutes Intravenous Every 24 hours 05/17/17 1325     05/17/17 0445  levofloxacin (LEVAQUIN) IVPB 500 mg     500 mg 100 mL/hr over 60 Minutes Intravenous  Once 05/17/17 0438 05/17/17 0540      Medications   Scheduled Meds: . docusate sodium  100 mg Oral BID  . enoxaparin (LOVENOX) injection  40 mg Subcutaneous Q24H  . FLUoxetine  20 mg Oral Daily  . fluticasone furoate-vilanterol  1 puff Inhalation Daily  . loratadine  10 mg Oral Daily  . morphine      .  Norethin Ace-Eth Estrad-FE  1 tablet Oral Daily  . nortriptyline  20 mg Oral QHS  . tranexamic acid  1,300 mg Oral TID  . [START ON 05/19/2017] Vitamin D (Ergocalciferol)  50,000 Units Oral Q7 days   Continuous Infusions: . levofloxacin (LEVAQUIN) IV Stopped (05/18/17 0907)   PRN Meds:.acetaminophen **OR** acetaminophen, albuterol, HYDROcodone-acetaminophen, morphine injection, ondansetron **OR** ondansetron (ZOFRAN) IV, SUMAtriptan   Data Review:   Micro Results Recent Results (from the past 240 hour(s))  Urine Culture     Status: Abnormal (Preliminary result)   Collection Time: 05/16/17  7:46 PM  Result Value Ref Range Status   Specimen Description   Final    URINE, RANDOM Performed at [facility], [address] [place], [place] [address]    Special Requests   Final    NONE Performed at [facility], [address] [place], [place] [address]    Culture >=100,000 COLONIES/mL ESCHERICHIA COLI (A)  Final   Report Status PENDING  Incomplete    Radiology Reports Ct Abdomen Pelvis W Contrast  Result Date: 05/17/2017 CLINICAL DATA:  Initial evaluation for acute abdominal pain, appendicitis suspected. EXAM: CT ABDOMEN AND PELVIS WITH CONTRAST TECHNIQUE: Multidetector CT imaging of the abdomen and pelvis was performed using the standard protocol following bolus administration of intravenous contrast. CONTRAST:  71mL ISOVUE-370 IOPAMIDOL (ISOVUE-370) INJECTION 76% COMPARISON:  Prior ultrasound from 05/16/2017. FINDINGS: Lower chest: Minimal atelectatic changes present within the lingula. Visualized lung bases are otherwise clear. Hepatobiliary: Liver demonstrates a normal contrast enhanced appearance. Gallbladder normal. No biliary dilatation. Pancreas: Pancreas normal. Spleen: Normal. Adrenals/Urinary Tract: Adrenal glands are normal. There is slightly delayed and striated nephrogram within the right kidney is compared to the left with hazy perinephric and periureteral  fat stranding. Right renal collecting system slightly dilated with associated mucosal enhancement. Findings consistent with acute pyelonephritis. No intrarenal abscess or other collection. No obstructive stone identified. Left kidney and renal collecting system are within normal limits. Bladder partially distended. Circumferential bladder wall  thickening may be related incomplete distension and/or acute cystitis. No layering stones within the bladder lumen. Stomach/Bowel: Stomach within normal limits. No evidence for bowel obstruction. Appendix visualized within the right lower quadrant and is of normal caliber and appearance without associated inflammatory changes to suggest acute appendicitis. No acute inflammatory changes seen about the bowels. Vascular/Lymphatic: Normal intravascular enhancement seen throughout the intra-abdominal aorta and its branch vessels. No adenopathy. Reproductive: Uterus and ovaries within normal limits for age. Other: No free air or fluid. Musculoskeletal: No acute osseous abnormality. No worrisome lytic or blastic osseous lesions. IMPRESSION: 1. Findings highly suggestive for acute pyelonephritis as above. Correlation with urinalysis recommended. No obstructive stone identified. 2. Circumferential bladder wall thickening. While this finding may be related to incomplete distension, possible acute cystitis could also be considered, particularly given the findings in the right kidney. 3. No other acute intra-abdominal or pelvic process. Normal appendix. Electronically Signed   By: Jeannine Boga M.D.   On: 05/17/2017 04:28   US Abdomen Limited Ruq  Result Date: 05/17/2017 CLINICAL DATA:  Initial evaluation for acute right upper and lower quadrant abdominal pain for 3 days. EXAM: ULTRASOUND ABDOMEN LIMITED RIGHT UPPER QUADRANT COMPARISON:  None. FINDINGS: Gallbladder: No gallstones or wall thickening visualized. No sonographic Murphy sign noted by sonographer. Common bile duct:  Diameter: 2.1 mm Liver: No focal lesion identified. Within normal limits in parenchymal echogenicity. Portal vein is patent on color Doppler imaging with normal direction of blood flow towards the liver. IMPRESSION: Normal right upper quadrant ultrasound. Electronically Signed   By: Jeannine Boga M.D.   On: 05/17/2017 00:22     CBC Recent Labs  Lab 05/16/17 1939 05/18/17 0439  WBC 9.9 10.0  HGB 11.6* 9.8*  HCT 34.7* 28.8*  PLT 217 171  MCV 88.6 88.7  MCH 29.7 30.2  MCHC 33.5 34.0  RDW 13.7 13.9    Chemistries  Recent Labs  Lab 05/16/17 1939 05/18/17 0439  NA 138 138  K 3.6 3.4*  CL 105 110  CO2 26 19*  GLUCOSE 89 107*  BUN 11 8  CREATININE 0.66 0.77  CALCIUM 8.9 7.8*  AST 17  --   ALT 13*  --   ALKPHOS 57  --   BILITOT 0.3  --    ------------------------------------------------------------------------------------------------------------------ estimated creatinine clearance is 93.6 mL/min (by C-G formula based on SCr of 0.77 mg/dL). ------------------------------------------------------------------------------------------------------------------ No results for input(s): HGBA1C in the last 72 hours. ------------------------------------------------------------------------------------------------------------------ No results for input(s): CHOL, HDL, LDLCALC, TRIG, CHOLHDL, LDLDIRECT in the last 72 hours. ------------------------------------------------------------------------------------------------------------------ Recent Labs    05/16/17 1939  TSH 1.345   ------------------------------------------------------------------------------------------------------------------ No results for input(s): VITAMINB12, FOLATE, FERRITIN, TIBC, IRON, RETICCTPCT in the last 72 hours.  Coagulation profile No results for input(s): INR, PROTIME in the last 168 hours.  No results for input(s): DDIMER in the last 72 hours.  Cardiac Enzymes No results for input(s): CKMB,  TROPONINI, MYOGLOBIN in the last 168 hours.  Invalid input(s): CK ------------------------------------------------------------------------------------------------------------------ Invalid input(s): [place]   This is a 22 year old female admitted for pyelonephritis. 1.  Pyelonephritis: Continue Levaquin, I reviewed her blood cultures patient had none drawn in the ED I will order for now urine culture showing E. coli sensitivities are currently pending 2.  Sepsis: Patient meets criteria via fever and tachycardia.    Continue to monitor 3.  Abdominal pain: Improve continue morphine. 4.  DVT prophylaxis: Lovenox 5.  GI prophylaxis: None       Code  Status Orders  (From admission, onward)        Start     Ordered   05/17/17 0547  Full code  Continuous     05/17/17 0546    Code Status History    This patient has a current code status but no historical code status.           Consults  none   DVT Prophylaxis  Lovenox    Lab Results  Component Value Date   PLT 171 05/18/2017     Time Spent in minutes   47min  Greater than 50% of time spent in care coordination and counseling patient regarding the condition and plan of care.   Dustin Flock M.D on 05/18/2017 at 2:28 PM  Between 7am to 6pm - Pager - [phone]  After 6pm go to [email or web address] - Proofreader  [employer]   Office  [phone]

## 2017-05-18 NOTE — Progress Notes (Signed)
Chaplain met with patient and her father. Patient was concerned about her medical condition, but was in good spirits. There seemed to be no spiritual concerns at this time.

## 2017-05-19 ENCOUNTER — Inpatient Hospital Stay: Payer: BC Managed Care – PPO

## 2017-05-19 LAB — URINE CULTURE

## 2017-05-19 LAB — URINALYSIS, COMPLETE (UACMP) WITH MICROSCOPIC
Bacteria, UA: NONE SEEN
Bilirubin Urine: NEGATIVE
GLUCOSE, UA: NEGATIVE mg/dL
Ketones, ur: NEGATIVE mg/dL
Leukocytes, UA: NEGATIVE
Nitrite: NEGATIVE
Protein, ur: NEGATIVE mg/dL
SPECIFIC GRAVITY, URINE: 1.003 — AB (ref 1.005–1.030)
pH: 7 (ref 5.0–8.0)

## 2017-05-19 LAB — RAPID HIV SCREEN (HIV 1/2 AB+AG)
HIV 1/2 ANTIBODIES: NONREACTIVE
HIV-1 P24 ANTIGEN - HIV24: NONREACTIVE

## 2017-05-19 MED ORDER — PHENAZOPYRIDINE HCL 200 MG PO TABS
200.0000 mg | ORAL_TABLET | Freq: Three times a day (TID) | ORAL | Status: DC
  Administered 2017-05-19 – 2017-05-20 (×3): 200 mg via ORAL
  Filled 2017-05-19 (×5): qty 1

## 2017-05-19 MED ORDER — PHENAZOPYRIDINE HCL 200 MG PO TABS: 200.0000 mg | ORAL_TABLET | Freq: Three times a day (TID) | ORAL | Status: DC

## 2017-05-19 MED ORDER — SODIUM CHLORIDE 0.9 % IV SOLN
1.0000 g | Freq: Three times a day (TID) | INTRAVENOUS | Status: DC
  Administered 2017-05-19: 1 g via INTRAVENOUS
  Filled 2017-05-19 (×3): qty 1

## 2017-05-19 MED ORDER — POLYETHYLENE GLYCOL 3350 17 G PO PACK
17.0000 g | PACK | Freq: Every day | ORAL | Status: DC
Start: 2017-05-19 — End: 2017-05-20
  Administered 2017-05-19: 17 g via ORAL
  Filled 2017-05-19 (×2): qty 1

## 2017-05-19 MED ORDER — CIPROFLOXACIN IN D5W 400 MG/200ML IV SOLN
400.0000 mg | Freq: Two times a day (BID) | INTRAVENOUS | Status: DC
  Administered 2017-05-20 (×2): 400 mg via INTRAVENOUS
  Filled 2017-05-19 (×4): qty 200

## 2017-05-19 MED ORDER — BISACODYL 10 MG RE SUPP
10.0000 mg | Freq: Once | RECTAL | Status: AC
  Administered 2017-05-19: 10 mg via RECTAL
  Filled 2017-05-19: qty 1

## 2017-05-19 MED ORDER — SODIUM CHLORIDE 0.9 % IV SOLN
INTRAVENOUS | Status: DC | PRN
  Administered 2017-05-19: 16:00:00 via INTRAVENOUS

## 2017-05-19 MED ORDER — SENNA 8.6 MG PO TABS
1.0000 | ORAL_TABLET | Freq: Every day | ORAL | Status: DC
  Administered 2017-05-19: 8.6 mg via ORAL
  Filled 2017-05-19 (×2): qty 1

## 2017-05-19 NOTE — Progress Notes (Signed)
Pharmacy Antibiotic Note  Angela Rocha is a 22 y.o. female admitted on 05/16/2017 with pyelonephritis.  Pharmacy has been consulted for ciprofloxacin dosing.  Plan: Will start patient on IV cipro 400 mg q12h   Will monitor for resolution of s/sx of infection to switch over to PO for duration of treatment period.  Height: 5\' 3"  (160 cm) Weight: 133 lb 9.6 oz (60.6 kg) IBW/kg (Calculated) : 52.4  Temp (24hrs), Avg:99.1 F (37.3 C), Min:97.8 F (36.6 C), Max:102.4 F (39.1 C)  Recent Labs  Lab 05/16/17 1939 05/18/17 0439  WBC 9.9 10.0  CREATININE 0.66 0.77    Estimated Creatinine Clearance: 93.6 mL/min (by C-G formula based on SCr of 0.77 mg/dL).    Allergies  Allergen Reactions  . Amoxicillin-Pot Clavulanate Rash and Other (See Comments)    Has patient had a PCN reaction causing immediate rash, facial/tongue/throat swelling, SOB or lightheadedness with hypotension: Yes Has patient had a PCN reaction causing severe rash involving mucus membranes or skin necrosis: No Has patient had a PCN reaction that required hospitalization: No Has patient had a PCN reaction occurring within the last 10 years: No If all of the above answers are "NO", then may proceed with Cephalosporin use.     Thank you for allowing pharmacy to be a part of this patient's care.  Tobie Lords, PharmD, BCPS Clinical Pharmacist 05/19/2017

## 2017-05-19 NOTE — Progress Notes (Addendum)
[facility]                                                                                                                                                                                  Patient Demographics   Angela Rocha, is a 22 y.o. female, DOB - [birth year], XBJ:[id number]  Admit date - 05/16/2017   Admitting Physician Harrie Foreman, MD  Outpatient Primary MD for the patient is Wayland Denis, PA-C   LOS - 2  Subjective: Patient continues to have pain on the right side of her flank.  Also had fevers last night Complains of dysuria  Review of Systems:   CONSTITUTIONAL: No documented fever. No fatigue, weakness. No weight gain, no weight loss.  EYES: No blurry or double vision.  ENT: No tinnitus. No postnasal drip. No redness of the oropharynx.  RESPIRATORY: No cough, no wheeze, no hemoptysis. No dyspnea.  CARDIOVASCULAR: No chest pain. No orthopnea. No palpitations. No syncope.  GASTROINTESTINAL: No nausea, no vomiting or diarrhea.  Positive abdominal pain. No melena or hematochezia.  GENITOURINARY: No dysuria or hematuria.  ENDOCRINE: No polyuria or nocturia. No heat or cold intolerance.  HEMATOLOGY: No anemia. No bruising. No bleeding.  INTEGUMENTARY: No rashes. No lesions.  MUSCULOSKELETAL: No arthritis. No swelling. No gout.  NEUROLOGIC: No numbness, tingling, or ataxia. No seizure-type activity.  PSYCHIATRIC: No anxiety. No insomnia. No ADD.    Vitals:   Vitals:   05/19/17 0043 05/19/17 0500 05/19/17 0548 05/19/17 1241  BP:   94/66 105/69  Pulse:   (!) 103 100  Resp:    17  Temp: 98.9 F (37.2 C)  97.8 F (36.6 C) 98.5 F (36.9 C)  TempSrc: Oral  Oral Oral  SpO2:   97% 96%  Weight:  60.6 kg (133 lb 9.6 oz)    Height:        Wt Readings from Last 3 Encounters:  05/19/17 60.6 kg (133 lb 9.6 oz) (60 %, Z= 0.26)*  09/09/16 57.6 kg (127 lb) (52 %, Z= 0.04)*  05/10/16 58.2 kg (128 lb 6.4 oz) (56 %, Z= 0.15)*   *  Growth percentiles are based on CDC (Girls, 2-20 Years) data.     Intake/Output Summary (Last 24 hours) at 05/19/2017 1440 Last data filed at 05/19/2017 1346 Gross per 24 hour  Intake 1323.75 ml  Output 2150 ml  Net -826.25 ml    Physical Exam:   GENERAL: Pleasant-appearing in no apparent distress.  HEAD, EYES, EARS, NOSE AND THROAT: Atraumatic, normocephalic. Extraocular muscles are intact. Pupils equal and reactive to light. Sclerae anicteric. No conjunctival injection. No oro-pharyngeal erythema.  NECK: Supple. There is  no jugular venous distention. No bruits, no lymphadenopathy, no thyromegaly.  HEART: Regular rate and rhythm,. No murmurs, no rubs, no clicks.  LUNGS: Clear to auscultation bilaterally. No rales or rhonchi. No wheezes.  ABDOMEN: Soft, flat, nontender, nondistended. Has good bowel sounds. No hepatosplenomegaly appreciated.  EXTREMITIES: No evidence of any cyanosis, clubbing, or peripheral edema.  +2 pedal and radial pulses bilaterally.  NEUROLOGIC: The patient is alert, awake, and oriented x3 with no focal motor or sensory deficits appreciated bilaterally.  SKIN: Moist and warm with no rashes appreciated.  Psych: Not anxious, depressed LN: No inguinal LN enlargement    Antibiotics   Anti-infectives (From admission, onward)   Start     Dose/Rate Route Frequency Ordered Stop   05/19/17 1300  meropenem (MERREM) 1 g in sodium chloride 0.9 % 100 mL IVPB     1 g 200 mL/hr over 30 Minutes Intravenous Every 8 hours 05/19/17 1254     05/18/17 0800  levofloxacin (LEVAQUIN) IVPB 750 mg  Status:  Discontinued     750 mg 100 mL/hr over 90 Minutes Intravenous Every 24 hours 05/17/17 1325 05/19/17 1137   05/17/17 0445  levofloxacin (LEVAQUIN) IVPB 500 mg     500 mg 100 mL/hr over 60 Minutes Intravenous  Once 05/17/17 0438 05/17/17 0540      Medications   Scheduled Meds: . docusate sodium  100 mg Oral BID  . enoxaparin (LOVENOX) injection  40 mg Subcutaneous Q24H  .  FLUoxetine  20 mg Oral Daily  . fluticasone furoate-vilanterol  1 puff Inhalation Daily  . loratadine  10 mg Oral Daily  . Norethin Ace-Eth Estrad-FE  1 tablet Oral Daily  . nortriptyline  20 mg Oral QHS  . phenazopyridine  200 mg Oral TID WC  . polyethylene glycol  17 g Oral Daily  . senna  1 tablet Oral Daily  . tranexamic acid  1,300 mg Oral TID  . Vitamin D (Ergocalciferol)  50,000 Units Oral Q7 days   Continuous Infusions: . meropenem (MERREM) IV     PRN Meds:.acetaminophen **OR** acetaminophen, albuterol, HYDROcodone-acetaminophen, morphine injection, ondansetron **OR** ondansetron (ZOFRAN) IV, SUMAtriptan   Data Review:   Micro Results Recent Results (from the past 240 hour(s))  Urine Culture     Status: Abnormal   Collection Time: 05/16/17  7:46 PM  Result Value Ref Range Status   Specimen Description   Final    URINE, RANDOM Performed at [facility], [address] [place], [place] [address]    Special Requests   Final    NONE Performed at [facility], [address] [place], [place] [address]    Culture >=100,000 COLONIES/mL ESCHERICHIA COLI (A)  Final   Report Status 05/19/2017 FINAL  Final   Organism ID, Bacteria ESCHERICHIA COLI (A)  Final      Susceptibility   Escherichia coli - MIC*    AMPICILLIN >=32 RESISTANT Resistant     CEFAZOLIN <=4 SENSITIVE Sensitive     CEFTRIAXONE <=1 SENSITIVE Sensitive     CIPROFLOXACIN 0.5 SENSITIVE Sensitive     GENTAMICIN <=1 SENSITIVE Sensitive     IMIPENEM <=0.25 SENSITIVE Sensitive     NITROFURANTOIN <=16 SENSITIVE Sensitive     TRIMETH/SULFA >=320 RESISTANT Resistant     AMPICILLIN/SULBACTAM 16 INTERMEDIATE Intermediate     PIP/TAZO <=4 SENSITIVE Sensitive     Extended ESBL NEGATIVE Sensitive     * >=100,000 COLONIES/mL ESCHERICHIA COLI  CULTURE, BLOOD (ROUTINE X 2) w Reflex to ID Panel  Status: None (Preliminary result)   Collection Time: 05/18/17  2:37 PM  Result Value Ref Range Status    Specimen Description BLOOD  LEFT AC  Final   Special Requests   Final    BOTTLES DRAWN AEROBIC AND ANAEROBIC Blood Culture adequate volume   Culture   Final    NO GROWTH < 24 HOURS Performed at [facility], [address], [place], [place] [address]    Report Status PENDING  Incomplete  CULTURE, BLOOD (ROUTINE X 2) w Reflex to ID Panel     Status: None (Preliminary result)   Collection Time: 05/18/17  2:37 PM  Result Value Ref Range Status   Specimen Description BLOOD RIGHT ANTECUBITAL  Final   Special Requests   Final    BOTTLES DRAWN AEROBIC AND ANAEROBIC Blood Culture adequate volume   Culture   Final    NO GROWTH < 24 HOURS Performed at [facility], [address] [place], [place] [address]    Report Status PENDING  Incomplete    Radiology Reports Ct Abdomen Pelvis W Contrast  Result Date: 05/17/2017 CLINICAL DATA:  Initial evaluation for acute abdominal pain, appendicitis suspected. EXAM: CT ABDOMEN AND PELVIS WITH CONTRAST TECHNIQUE: Multidetector CT imaging of the abdomen and pelvis was performed using the standard protocol following bolus administration of intravenous contrast. CONTRAST:  60mL ISOVUE-370 IOPAMIDOL (ISOVUE-370) INJECTION 76% COMPARISON:  Prior ultrasound from 05/16/2017. FINDINGS: Lower chest: Minimal atelectatic changes present within the lingula. Visualized lung bases are otherwise clear. Hepatobiliary: Liver demonstrates a normal contrast enhanced appearance. Gallbladder normal. No biliary dilatation. Pancreas: Pancreas normal. Spleen: Normal. Adrenals/Urinary Tract: Adrenal glands are normal. There is slightly delayed and striated nephrogram within the right kidney is compared to the left with hazy perinephric and periureteral fat stranding. Right renal collecting system slightly dilated with associated mucosal enhancement. Findings consistent with acute pyelonephritis. No intrarenal abscess or other collection. No obstructive stone  identified. Left kidney and renal collecting system are within normal limits. Bladder partially distended. Circumferential bladder wall thickening may be related incomplete distension and/or acute cystitis. No layering stones within the bladder lumen. Stomach/Bowel: Stomach within normal limits. No evidence for bowel obstruction. Appendix visualized within the right lower quadrant and is of normal caliber and appearance without associated inflammatory changes to suggest acute appendicitis. No acute inflammatory changes seen about the bowels. Vascular/Lymphatic: Normal intravascular enhancement seen throughout the intra-abdominal aorta and its branch vessels. No adenopathy. Reproductive: Uterus and ovaries within normal limits for age. Other: No free air or fluid. Musculoskeletal: No acute osseous abnormality. No worrisome lytic or blastic osseous lesions. IMPRESSION: 1. Findings highly suggestive for acute pyelonephritis as above. Correlation with urinalysis recommended. No obstructive stone identified. 2. Circumferential bladder wall thickening. While this finding may be related to incomplete distension, possible acute cystitis could also be considered, particularly given the findings in the right kidney. 3. No other acute intra-abdominal or pelvic process. Normal appendix. Electronically Signed   By: Jeannine Boga M.D.   On: 05/17/2017 04:28   US Abdomen Limited Ruq  Result Date: 05/17/2017 CLINICAL DATA:  Initial evaluation for acute right upper and lower quadrant abdominal pain for 3 days. EXAM: ULTRASOUND ABDOMEN LIMITED RIGHT UPPER QUADRANT COMPARISON:  None. FINDINGS: Gallbladder: No gallstones or wall thickening visualized. No sonographic Murphy sign noted by sonographer. Common bile duct: Diameter: 2.1 mm Liver: No focal lesion identified. Within normal limits in parenchymal echogenicity. Portal vein is patent on color Doppler imaging with normal direction of blood  flow towards the liver.  IMPRESSION: Normal right upper quadrant ultrasound. Electronically Signed   By: Jeannine Boga M.D.   On: 05/17/2017 00:22     CBC Recent Labs  Lab 05/16/17 1939 05/18/17 0439  WBC 9.9 10.0  HGB 11.6* 9.8*  HCT 34.7* 28.8*  PLT 217 171  MCV 88.6 88.7  MCH 29.7 30.2  MCHC 33.5 34.0  RDW 13.7 13.9    Chemistries  Recent Labs  Lab 05/16/17 1939 05/18/17 0439  NA 138 138  K 3.6 3.4*  CL 105 110  CO2 26 19*  GLUCOSE 89 107*  BUN 11 8  CREATININE 0.66 0.77  CALCIUM 8.9 7.8*  AST 17  --   ALT 13*  --   ALKPHOS 57  --   BILITOT 0.3  --    ------------------------------------------------------------------------------------------------------------------ estimated creatinine clearance is 93.6 mL/min (by C-G formula based on SCr of 0.77 mg/dL). ------------------------------------------------------------------------------------------------------------------ No results for input(s): HGBA1C in the last 72 hours. ------------------------------------------------------------------------------------------------------------------ No results for input(s): CHOL, HDL, LDLCALC, TRIG, CHOLHDL, LDLDIRECT in the last 72 hours. ------------------------------------------------------------------------------------------------------------------ Recent Labs    05/16/17 1939  TSH 1.345   ------------------------------------------------------------------------------------------------------------------ No results for input(s): VITAMINB12, FOLATE, FERRITIN, TIBC, IRON, RETICCTPCT in the last 72 hours.  Coagulation profile No results for input(s): INR, PROTIME in the last 168 hours.  No results for input(s): DDIMER in the last 72 hours.  Cardiac Enzymes No results for input(s): CKMB, TROPONINI, MYOGLOBIN in the last 168 hours.  Invalid input(s): CK ------------------------------------------------------------------------------------------------------------------ Invalid input(s):  [place]   This is a 22 year old female admitted for pyelonephritis. 1.  Pyelonephritis: Continues to have fever, I discussed the case with Dr. Rayann Heman of urology who reviewed the CT scan he states that she may have some hydronephrosis and recommended a ultrasound of her kidneys.  If there is hydronephrosis then she may need a stent.  I also ask infectious disease to see the patient.  She will be changed over to meropenem, I will add pyridium for dysuria, recheck ua 2.  Sepsis: Patient meets criteria via fever and tachycardia.    Continue to monitor 3.  Abdominal pain: Improve continue morphine. 4.  DVT prophylaxis: Lovenox 5.  GI prophylaxis: None       Code Status Orders  (From admission, onward)        Start     Ordered   05/17/17 0547  Full code  Continuous     05/17/17 0546    Code Status History    This patient has a current code status but no historical code status.           Consults  none   DVT Prophylaxis  Lovenox    Lab Results  Component Value Date   PLT 171 05/18/2017     Time Spent in minutes   27min  Greater than 50% of time spent in care coordination and counseling patient regarding the condition and plan of care.   Dustin Flock M.D on 05/19/2017 at 2:40 PM  Between 7am to 6pm - Pager - [phone]  After 6pm go to [email or web address] - Proofreader  [employer]   Office  [phone]

## 2017-05-19 NOTE — Consult Note (Signed)
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 [facility]TI with bactrim for 10 days. She developed the pain and nv over the last 5 days and was admitted with wbc 10, temp 100.8, ua with 6-10 wbc, ucx with E coli R to bactrim. CT shows R pyelonephritis but no stones. Clinically improving but with recurrent nightly fever. Tolerating Po.   Past Medical History:  Diagnosis Date  . ADHD   . Anxiety   . Asthma   . Migraine   . Pelvic congestion syndrome    Past Surgical History:  Procedure Laterality Date  . HIP SURGERY    . TONSILLECTOMY     Social History   Tobacco Use  . Smoking status: Never Smoker  . Smokeless tobacco: Never Used  Substance Use Topics  . Alcohol use: No    Alcohol/week: 0.0 oz  . Drug use: No   Family History  Problem Relation Age of Onset  . Diabetes Mellitus II Father     Allergies:  Allergies  Allergen Reactions  . Amoxicillin-Pot Clavulanate Rash and Other (See Comments)    Has patient had a PCN reaction causing immediate rash, facial/tongue/throat swelling, SOB or lightheadedness with hypotension: Yes Has patient had a PCN reaction causing severe rash involving mucus membranes or skin necrosis: No Has patient had a PCN reaction that required hospitalization: No Has patient had a PCN reaction occurring within the last 10 years: No If all of the above answers are "NO", then may proceed with Cephalosporin use.     Current antibiotics: Antibiotics Given (last 72 hours)    Date/Time Action Medication Dose Rate   05/17/17 0448 New Bag/Given   levofloxacin (LEVAQUIN) IVPB 500 mg 500 mg 100 mL/hr   05/18/17 0737 New Bag/Given   levofloxacin (LEVAQUIN) IVPB 750 mg 750 mg 100 mL/hr   05/19/17  0856 New Bag/Given   levofloxacin (LEVAQUIN) IVPB 750 mg 750 mg 100 mL/hr   05/19/17 1552 New Bag/Given   meropenem (MERREM) 1 g in sodium chloride 0.9 % 100 mL IVPB 1 g 200 mL/hr      MEDICATIONS: . docusate sodium  100 mg Oral BID  . enoxaparin (LOVENOX) injection  40 mg Subcutaneous Q24H  . FLUoxetine  20 mg Oral Daily  . fluticasone furoate-vilanterol  1 puff Inhalation Daily  . loratadine  10 mg Oral Daily  . Norethin Ace-Eth Estrad-FE  1 tablet Oral Daily  . nortriptyline  20 mg Oral QHS  . phenazopyridine  200 mg Oral TID WC  . polyethylene glycol  17 g Oral Daily  . senna  1 tablet Oral Daily  . tranexamic acid  1,300 mg Oral TID  . Vitamin D (Ergocalciferol)  50,000 Units Oral Q7 days    Review of Systems - 11 systems reviewed and negative per HPI   OBJECTIVE: Temp:  [97.8 F (36.6 C)-102.4 F (39.1 C)] 98.5 F (36.9 C) (05/10 1241) Pulse Rate:  [100-103] 100 (05/10 1241) Resp:  [17] 17 (05/10 1241) BP: (94-105)/(63-69) 105/69 (05/10 1241) SpO2:  [96 %-99 %] 96 % (05/10 1241) Weight:  [60.6 kg (133 lb 9.6 oz)] 60.6 kg (133 lb 9.6 oz) (05/10 0500) Physical Exam  Constitutional:  oriented  to person, place, and time. appears well-developed and well-nourished. No distress.  HENT: [place]/AT, PERRLA, no scleral icterus Mouth/Throat: Oropharynx is clear and moist. No oropharyngeal exudate.  Cardiovascular: Normal rate, regular rhythm and normal heart sounds. Exam reveals no gallop and no friction rub.  No murmur heard.  Pulmonary/Chest: Effort normal and breath sounds normal. No respiratory distress.  has no wheezes.  Neck = supple, no nuchal rigidity Abdominal: Soft. Bowel sounds are normal.  exhibits no distension. There is no tenderness.  Mildl R cva t Lymphadenopathy: no cervical adenopathy. No axillary adenopathy Neurological: alert and oriented to person, place, and time.  Skin: Skin is warm and dry. No rash noted. No erythema.  Psychiatric: a normal mood and  affect.  behavior is normal.   LABS:. Results for orders placed or performed during the hospital encounter of 05/16/17 (from the past 48 hour(s))  Glucose, capillary     Status: None   Collection Time: 05/17/17 10:18 PM  Result Value Ref Range   Glucose-Capillary 86 65 - 99 mg/dL  CBC     Status: Abnormal   Collection Time: 05/18/17  4:39 AM  Result Value Ref Range   WBC 10.0 3.6 - 11.0 K/uL   RBC 3.25 (L) 3.80 - 5.20 MIL/uL   Hemoglobin 9.8 (L) 12.0 - 16.0 g/dL   HCT 28.8 (L) 35.0 - 47.0 %   MCV 88.7 80.0 - 100.0 fL   MCH 30.2 26.0 - 34.0 pg   MCHC 34.0 32.0 - 36.0 g/dL   RDW 13.9 11.5 - 14.5 %   Platelets 171 150 - 440 K/uL    Comment: Performed at [facility], [address] [place], [place] [address]  Basic metabolic panel     Status: Abnormal   Collection Time: 05/18/17  4:39 AM  Result Value Ref Range   Sodium 138 135 - 145 mmol/L   Potassium 3.4 (L) 3.5 - 5.1 mmol/L   Chloride 110 101 - 111 mmol/L   CO2 19 (L) 22 - 32 mmol/L   Glucose, Bld 107 (H) 65 - 99 mg/dL   BUN 8 6 - 20 mg/dL   Creatinine, Ser 0.77 0.44 - 1.00 mg/dL   Calcium 7.8 (L) 8.9 - 10.3 mg/dL   GFR calc non Af Amer >60 >60 mL/min   GFR calc Af Amer >60 >60 mL/min    Comment: (NOTE) The eGFR has been calculated using the CKD EPI equation. This calculation has not been validated in all clinical situations. eGFR's persistently <60 mL/min signify possible Chronic Kidney Disease.    Anion gap 9 5 - 15    Comment: Performed at [facility], [place], [place] [address]  Rapid HIV screen (HIV 1/2 Ab+Ag)     Status: None   Collection Time: 05/18/17  4:39 AM  Result Value Ref Range   HIV-1 P24 Antigen - HIV24 NON REACTIVE NON REACTIVE   HIV 1/2 Antibodies NON REACTIVE NON REACTIVE   Interpretation (HIV Ag Ab)      A non reactive test result means that HIV 1 or HIV 2 antibodies and HIV 1 p24 antigen were not detected in the specimen.    Comment: Performed at [facility], [address] [place], [place] [address]  CULTURE, BLOOD (ROUTINE X 2) w Reflex to ID Panel     Status: None (Preliminary result)   Collection Time: 05/18/17  2:37 PM  Result Value Ref Range   Specimen Description BLOOD  LEFT AC  Special Requests      BOTTLES DRAWN AEROBIC AND ANAEROBIC Blood Culture adequate volume   Culture      NO GROWTH < 24 HOURS Performed at [facility], [address] [place], [place] [address]    Report Status PENDING   CULTURE, BLOOD (ROUTINE X 2) w Reflex to ID Panel     Status: None (Preliminary result)   Collection Time: 05/18/17  2:37 PM  Result Value Ref Range   Specimen Description BLOOD RIGHT ANTECUBITAL    Special Requests      BOTTLES DRAWN AEROBIC AND ANAEROBIC Blood Culture adequate volume   Culture      NO GROWTH < 24 HOURS Performed at [facility], [address] [place], [place] [address]    Report Status PENDING   Urinalysis, Complete w Microscopic     Status: Abnormal   Collection Time: 05/19/17  5:01 PM  Result Value Ref Range   Color, Urine COLORLESS (A) YELLOW   APPearance CLEAR (A) CLEAR   Specific Gravity, Urine 1.003 (L) 1.005 - 1.030   pH 7.0 5.0 - 8.0   Glucose, UA NEGATIVE NEGATIVE mg/dL   Hgb urine dipstick SMALL (A) NEGATIVE   Bilirubin Urine NEGATIVE NEGATIVE   Ketones, ur NEGATIVE NEGATIVE mg/dL   Protein, ur NEGATIVE NEGATIVE mg/dL   Nitrite NEGATIVE NEGATIVE   Leukocytes, UA NEGATIVE NEGATIVE   RBC / HPF 0-5 0 - 5 RBC/hpf   WBC, UA 0-5 0 - 5 WBC/hpf   Bacteria, UA NONE SEEN NONE SEEN   Squamous Epithelial / LPF 0-5 0 - 5    Comment: Performed at [facility], [address] [place], [place] [address]   No components found for: ESR, C REACTIVE PROTEIN MICRO: Recent Results (from the past 720 hour(s))  Urine Culture     Status: Abnormal   Collection Time: 05/16/17  7:46 PM  Result Value Ref Range Status   Specimen Description   Final    URINE,  RANDOM Performed at [facility], [address] [place], [place] [address]    Special Requests   Final    NONE Performed at [facility], [address] [place], [place] [address]    Culture >=100,000 COLONIES/mL ESCHERICHIA COLI (A)  Final   Report Status 05/19/2017 FINAL  Final   Organism ID, Bacteria ESCHERICHIA COLI (A)  Final      Susceptibility   Escherichia coli - MIC*    AMPICILLIN >=32 RESISTANT Resistant     CEFAZOLIN <=4 SENSITIVE Sensitive     CEFTRIAXONE <=1 SENSITIVE Sensitive     CIPROFLOXACIN 0.5 SENSITIVE Sensitive     GENTAMICIN <=1 SENSITIVE Sensitive     IMIPENEM <=0.25 SENSITIVE Sensitive     NITROFURANTOIN <=16 SENSITIVE Sensitive     TRIMETH/SULFA >=320 RESISTANT Resistant     AMPICILLIN/SULBACTAM 16 INTERMEDIATE Intermediate     PIP/TAZO <=4 SENSITIVE Sensitive     Extended ESBL NEGATIVE Sensitive     * >=100,000 COLONIES/mL ESCHERICHIA COLI  CULTURE, BLOOD (ROUTINE X 2) w Reflex to ID Panel     Status: None (Preliminary result)   Collection Time: 05/18/17  2:37 PM  Result Value Ref Range Status   Specimen Description BLOOD  LEFT AC  Final   Special Requests   Final    BOTTLES DRAWN AEROBIC AND ANAEROBIC Blood Culture adequate volume   Culture   Final    NO GROWTH < 24 HOURS Performed at [facility], [address] [place], [place] [address]    Report Status PENDING  Incomplete  CULTURE, BLOOD (ROUTINE X 2) w Reflex to ID Panel     Status: None (Preliminary result)   Collection Time: 05/18/17  2:37 PM  Result Value Ref Range Status   Specimen Description BLOOD RIGHT ANTECUBITAL  Final   Special Requests   Final    BOTTLES DRAWN AEROBIC AND ANAEROBIC Blood Culture adequate volume   Culture   Final    NO GROWTH < 24 HOURS Performed at [facility], [address], [place], [place] [address]    Report Status PENDING  Incomplete    IMAGING: Ct Abdomen Pelvis W Contrast  Result Date: 05/17/2017 CLINICAL  DATA:  Initial evaluation for acute abdominal pain, appendicitis suspected. EXAM: CT ABDOMEN AND PELVIS WITH CONTRAST TECHNIQUE: Multidetector CT imaging of the abdomen and pelvis was performed using the standard protocol following bolus administration of intravenous contrast. CONTRAST:  62m ISOVUE-370 IOPAMIDOL (ISOVUE-370) INJECTION 76% COMPARISON:  Prior ultrasound from 05/16/2017. FINDINGS: Lower chest: Minimal atelectatic changes present within the lingula. Visualized lung bases are otherwise clear. Hepatobiliary: Liver demonstrates a normal contrast enhanced appearance. Gallbladder normal. No biliary dilatation. Pancreas: Pancreas normal. Spleen: Normal. Adrenals/Urinary Tract: Adrenal glands are normal. There is slightly delayed and striated nephrogram within the right kidney is compared to the left with hazy perinephric and periureteral fat stranding. Right renal collecting system slightly dilated with associated mucosal enhancement. Findings consistent with acute pyelonephritis. No intrarenal abscess or other collection. No obstructive stone identified. Left kidney and renal collecting system are within normal limits. Bladder partially distended. Circumferential bladder wall thickening may be related incomplete distension and/or acute cystitis. No layering stones within the bladder lumen. Stomach/Bowel: Stomach within normal limits. No evidence for bowel obstruction. Appendix visualized within the right lower quadrant and is of normal caliber and appearance without associated inflammatory changes to suggest acute appendicitis. No acute inflammatory changes seen about the bowels. Vascular/Lymphatic: Normal intravascular enhancement seen throughout the intra-abdominal aorta and its branch vessels. No adenopathy. Reproductive: Uterus and ovaries within normal limits for age. Other: No free air or fluid. Musculoskeletal: No acute osseous abnormality. No worrisome lytic or blastic osseous lesions. IMPRESSION:  1. Findings highly suggestive for acute pyelonephritis as above. Correlation with urinalysis recommended. No obstructive stone identified. 2. Circumferential bladder wall thickening. While this finding may be related to incomplete distension, possible acute cystitis could also be considered, particularly given the findings in the right kidney. 3. No other acute intra-abdominal or pelvic process. Normal appendix. Electronically Signed   By: BJeannine BogaM.D.   On: 05/17/2017 04:28   [place]  Result Date: 05/19/2017 CLINICAL DATA:  Hydronephrosis. EXAM: RENAL / URINARY TRACT ULTRASOUND COMPLETE COMPARISON:  Body CT 05/17/2017 FINDINGS: Right Kidney: Length: 10.0 cm. Echogenicity within normal limits. No mass or hydronephrosis visualized. Left Kidney: Length: 10.0 cm. Echogenicity within normal limits. No mass or hydronephrosis visualized. Bladder: Appears normal for degree of bladder distention. Bilateral ureteral jets seen. IMPRESSION: Normal renal ultrasound. Electronically Signed   By: DFidela SalisburyM.D.   On: 05/19/2017 14:38   [place]oreaAbdomen Limited Ruq  Result Date: 05/17/2017 CLINICAL DATA:  Initial evaluation for acute right upper and lower quadrant abdominal pain for 3 days. EXAM: ULTRASOUND ABDOMEN LIMITED RIGHT UPPER QUADRANT COMPARISON:  None. FINDINGS: Gallbladder: No gallstones or wall thickening visualized. No sonographic Murphy sign noted by sonographer. Common bile duct: Diameter: 2.1 mm Liver: No focal lesion identified. Within normal limits in  parenchymal echogenicity. Portal vein is patent on color Doppler imaging with normal direction of blood flow towards the liver. IMPRESSION: Normal right upper quadrant ultrasound. Electronically Signed   By: Jeannine Boga M.D.   On: 05/17/2017 00:22    Assessment:   Angela Rocha is a 22 y.o. female admitted with Pyelonephritis after being treated for 10 days with bactrim for a UTI/ Cx with E coli R to bactrim. CT shows Pyelo.  Continues to have fever. Over labs nml.  I discussed with patient and her father that it is nml for pyelo to cause fevers up to 5-7 days despite effective treatment. Can be treated as otpt as long as tolerating orals. She is also dealing with constipation.   Recommendations Can change back to quinolone with cipro. Can dc on oral cipro 500 bid for 21 total days given complexity of her presentation. If still febrile by early to mid next week would repeat CT to ensure not developing a renal abscess.  Thank you very much for allowing me to participate in the care of this patient. Please call with questions.   Cheral Marker. Ola Spurr, MD

## 2017-05-19 NOTE — Progress Notes (Signed)
Pharmacy Antibiotic Note  Angela Rocha is a 22 y.o. female admitted on 05/16/2017 with UTI.  Pharmacy has been consulted for merrem dosing.  Plan: merrem 1gm iv q8h   Height: 5\' 3"  (160 cm) Weight: 133 lb 9.6 oz (60.6 kg) IBW/kg (Calculated) : 52.4  Temp (24hrs), Avg:98.9 F (37.2 C), Min:97.7 F (36.5 C), Max:102.4 F (39.1 C)  Recent Labs  Lab 05/16/17 1939 05/18/17 0439  WBC 9.9 10.0  CREATININE 0.66 0.77    Estimated Creatinine Clearance: 93.6 mL/min (by C-G formula based on SCr of 0.77 mg/dL).    Allergies  Allergen Reactions  . Amoxicillin-Pot Clavulanate Rash and Other (See Comments)    Has patient had a PCN reaction causing immediate rash, facial/tongue/throat swelling, SOB or lightheadedness with hypotension: Yes Has patient had a PCN reaction causing severe rash involving mucus membranes or skin necrosis: No Has patient had a PCN reaction that required hospitalization: No Has patient had a PCN reaction occurring within the last 10 years: No If all of the above answers are "NO", then may proceed with Cephalosporin use.     Antimicrobials this admission: Anti-infectives (From admission, onward)   Start     Dose/Rate Route Frequency Ordered Stop   05/19/17 1300  meropenem (MERREM) 1 g in sodium chloride 0.9 % 100 mL IVPB     1 g 200 mL/hr over 30 Minutes Intravenous Every 8 hours 05/19/17 1254     05/18/17 0800  levofloxacin (LEVAQUIN) IVPB 750 mg  Status:  Discontinued     750 mg 100 mL/hr over 90 Minutes Intravenous Every 24 hours 05/17/17 1325 05/19/17 1137   05/17/17 0445  levofloxacin (LEVAQUIN) IVPB 500 mg     500 mg 100 mL/hr over 60 Minutes Intravenous  Once 05/17/17 0438 05/17/17 0540       Microbiology results: Recent Results (from the past 240 hour(s))  Urine Culture     Status: Abnormal   Collection Time: 05/16/17  7:46 PM  Result Value Ref Range Status   Specimen Description   Final    URINE, RANDOM Performed at [facility],  [address] [place], [place] [address]    Special Requests   Final    NONE Performed at [facility], [address] [place],  [address]    Culture >=100,000 COLONIES/mL ESCHERICHIA COLI (A)  Final   Report Status 05/19/2017 FINAL  Final   Organism ID, Bacteria ESCHERICHIA COLI (A)  Final      Susceptibility   Escherichia coli - MIC*    AMPICILLIN >=32 RESISTANT Resistant     CEFAZOLIN <=4 SENSITIVE Sensitive     CEFTRIAXONE <=1 SENSITIVE Sensitive     CIPROFLOXACIN 0.5 SENSITIVE Sensitive     GENTAMICIN <=1 SENSITIVE Sensitive     IMIPENEM <=0.25 SENSITIVE Sensitive     NITROFURANTOIN <=16 SENSITIVE Sensitive     TRIMETH/SULFA >=320 RESISTANT Resistant     AMPICILLIN/SULBACTAM 16 INTERMEDIATE Intermediate     PIP/TAZO <=4 SENSITIVE Sensitive     Extended ESBL NEGATIVE Sensitive     * >=100,000 COLONIES/mL ESCHERICHIA COLI  CULTURE, BLOOD (ROUTINE X 2) w Reflex to ID Panel     Status: None (Preliminary result)   Collection Time: 05/18/17  2:37 PM  Result Value Ref Range Status   Specimen Description BLOOD  LEFT AC  Final   Special Requests   Final    BOTTLES DRAWN AEROBIC AND ANAEROBIC Blood Culture adequate volume   Culture   Final  NO GROWTH < 24 HOURS Performed at [facility], [address] [place], [place] [address]    Report Status PENDING  Incomplete  CULTURE, BLOOD (ROUTINE X 2) w Reflex to ID Panel     Status: None (Preliminary result)   Collection Time: 05/18/17  2:37 PM  Result Value Ref Range Status   Specimen Description BLOOD RIGHT ANTECUBITAL  Final   Special Requests   Final    BOTTLES DRAWN AEROBIC AND ANAEROBIC Blood Culture adequate volume   Culture   Final    NO GROWTH < 24 HOURS Performed at [facility], [address] [place], [place] [address]    Report Status PENDING  Incomplete    Thank you for allowing pharmacy to be a part of this patient's care.  Donna Christen [name] 05/19/2017 12:54 PM

## 2017-05-20 MED ORDER — CIPROFLOXACIN HCL 500 MG PO TABS: 500.0000 mg | ORAL_TABLET | Freq: Two times a day (BID) | ORAL | 0 refills | Status: AC

## 2017-05-20 MED ORDER — PHENAZOPYRIDINE HCL 200 MG PO TABS: 200.0000 mg | ORAL_TABLET | Freq: Three times a day (TID) | ORAL | 0 refills | Status: DC

## 2017-05-20 MED ORDER — POLYETHYLENE GLYCOL 3350 17 G PO PACK: 17.0000 g | PACK | Freq: Every day | ORAL | 0 refills | Status: DC | PRN

## 2017-05-20 MED ORDER — ACETAMINOPHEN 325 MG PO TABS: 650.0000 mg | ORAL_TABLET | Freq: Four times a day (QID) | ORAL | Status: DC | PRN

## 2017-05-20 MED ORDER — HYDROCODONE-ACETAMINOPHEN 5-325 MG PO TABS: 1.0000 | ORAL_TABLET | Freq: Four times a day (QID) | ORAL | 0 refills | Status: DC | PRN

## 2017-05-20 MED ORDER — IBUPROFEN 400 MG PO TABS
600.0000 mg | ORAL_TABLET | Freq: Four times a day (QID) | ORAL | Status: DC | PRN
  Administered 2017-05-20: 600 mg via ORAL
  Filled 2017-05-20: qty 2

## 2017-05-20 MED ORDER — HYDROCODONE-ACETAMINOPHEN 5-325 MG PO TABS: 1.0000 | ORAL_TABLET | ORAL | Status: DC | PRN

## 2017-05-20 MED ORDER — IBUPROFEN 600 MG PO TABS: 600.0000 mg | ORAL_TABLET | Freq: Four times a day (QID) | ORAL | 0 refills | Status: DC | PRN

## 2017-05-20 NOTE — Discharge Summary (Signed)
[facility] NAME: Angela Rocha    MR#:  [id number]  DATE OF BIRTH:  1995/03/08  DATE OF ADMISSION:  05/16/2017 ADMITTING PHYSICIAN: Harrie Foreman, MD  DATE OF DISCHARGE: 05/20/2017  PRIMARY CARE PHYSICIAN: Wayland Denis, PA-C    ADMISSION DIAGNOSIS:  Pyelonephritis [N12] Abdominal pain [R10.9]  DISCHARGE DIAGNOSIS:  Active Problems:   Pyelonephritis   SECONDARY DIAGNOSIS:   Past Medical History:  Diagnosis Date  . ADHD   . Anxiety   . Asthma   . Migraine   . Pelvic congestion syndrome     HOSPITAL COURSE:   1.  Clinical sepsis with acute pyelonephritis with fever.  Patient was initially started on IV Levaquin and then switched over to meropenem.  As per infectious disease can switch over to Cipro and treat for a  long 21-day course. 2.  Abdominal pain with nausea.  This has improved.  Oral pain medications as needed switch over to Tylenol and ibuprofen as quick as possible. 3.  History of anxiety, ADHD and migraine.  Continue her usual medications at home.   DISCHARGE CONDITIONS:   Satisfactory  CONSULTS OBTAINED:  Treatment Team:  Leonel Ramsay, MD  DRUG ALLERGIES:   Allergies  Allergen Reactions  . Amoxicillin-Pot Clavulanate Rash and Other (See Comments)    Has patient had a PCN reaction causing immediate rash, facial/tongue/throat swelling, SOB or lightheadedness with hypotension: Yes Has patient had a PCN reaction causing severe rash involving mucus membranes or skin necrosis: No Has patient had a PCN reaction that required hospitalization: No Has patient had a PCN reaction occurring within the last 10 years: No If all of the above answers are "NO", then may proceed with Cephalosporin use.     DISCHARGE MEDICATIONS:   Allergies as of 05/20/2017      Reactions   Amoxicillin-pot Clavulanate Rash, Other (See Comments)   Has patient had a PCN reaction causing immediate rash,  facial/tongue/throat swelling, SOB or lightheadedness with hypotension: Yes Has patient had a PCN reaction causing severe rash involving mucus membranes or skin necrosis: No Has patient had a PCN reaction that required hospitalization: No Has patient had a PCN reaction occurring within the last 10 years: No If all of the above answers are "NO", then may proceed with Cephalosporin use.      Medication List    TAKE these medications   acetaminophen 325 MG tablet Commonly known as:  TYLENOL Take 2 tablets (650 mg total) by mouth every 6 (six) hours as needed for mild pain (or Fever >/= 101).   albuterol 108 (90 Base) MCG/ACT inhaler Commonly known as:  PROVENTIL HFA;VENTOLIN HFA Inhale 2 puffs into the lungs every 6 (six) hours as needed for wheezing or shortness of breath.   budesonide-formoterol 160-4.5 MCG/ACT inhaler Commonly known as:  SYMBICORT Inhale 2 puffs into the lungs 2 (two) times daily.   cetirizine 10 MG tablet Commonly known as:  ZYRTEC Take 10 mg by mouth daily.   ciprofloxacin 500 MG tablet Commonly known as:  CIPRO Take 1 tablet (500 mg total) by mouth 2 (two) times daily for 18 days.   cyanocobalamin 1000 MCG/ML injection Commonly known as:  (VITAMIN B-12) Inject 1 mL (1,000 mcg total) into the muscle every 30 (thirty) days.   dexmethylphenidate 2.5 MG tablet Commonly known as:  FOCALIN Take 2.5 mg by mouth daily.   FLUoxetine 20 MG capsule Commonly known as:  PROZAC Take 20 mg by  mouth daily.   HYDROcodone-acetaminophen 5-325 MG tablet Commonly known as:  NORCO/VICODIN Take 1 tablet by mouth every 6 (six) hours as needed for severe pain.   ibuprofen 600 MG tablet Commonly known as:  ADVIL,MOTRIN Take 1 tablet (600 mg total) by mouth every 6 (six) hours as needed for moderate pain.   lisdexamfetamine 20 MG capsule Commonly known as:  VYVANSE Take 20 mg by mouth daily.   nortriptyline 10 MG capsule Commonly known as:  PAMELOR Take 20 mg by mouth  at bedtime.   phenazopyridine 200 MG tablet Commonly known as:  PYRIDIUM Take 1 tablet (200 mg total) by mouth 3 (three) times daily with meals.   polyethylene glycol packet Commonly known as:  MIRALAX / GLYCOLAX Take 17 g by mouth daily as needed.   rizatriptan 10 MG disintegrating tablet Commonly known as:  MAXALT-MLT Take 10 mg by mouth as directed. TAKE 1 TAB AT HEADACHE ONSET MAY TAKE A 2ND DOSE AFTER 2 HOURS IF NEEDED NO MORE THAN 2 DOSES/24 HRS   TAYTULLA 1-20 MG-MCG(24) Caps Generic drug:  Norethin Ace-Eth Estrad-FE TAKE 1 TABLET BY MOUTH DAILY.   tranexamic acid 650 MG Tabs tablet Commonly known as:  LYSTEDA Take 2 tablets (1,300 mg total) by mouth 3 (three) times daily. Take during menses for a maximum of five days   Vitamin D (Ergocalciferol) 50000 units Caps capsule Commonly known as:  DRISDOL Take 1 capsule (50,000 Units total) by mouth every 7 (seven) days. What changed:  additional instructions        DISCHARGE INSTRUCTIONS:    Follow-up PMD 6 days   If you experience worsening of your admission symptoms, develop shortness of breath, life threatening emergency, suicidal or homicidal thoughts you must seek medical attention immediately by calling 911 or calling your MD immediately  if symptoms less severe.  You Must read complete instructions/literature along with all the possible adverse reactions/side effects for all the Medicines you take and that have been prescribed to you. Take any new Medicines after you have completely understood and accept all the possible adverse reactions/side effects.   Please note  You were cared for by a hospitalist during your hospital stay. If you have any questions about your discharge medications or the care you received while you were in the hospital after you are discharged, you can call the unit and asked to speak with the hospitalist on call if the hospitalist that took care of you is not available. Once you are  discharged, your primary care physician will handle any further medical issues. Please note that NO REFILLS for any discharge medications will be authorized once you are discharged, as it is imperative that you return to your primary care physician (or establish a relationship with a primary care physician if you do not have one) for your aftercare needs so that they can reassess your need for medications and monitor your lab values.    Today   CHIEF COMPLAINT:   Chief Complaint  Patient presents with  . Abdominal Pain    HISTORY OF PRESENT ILLNESS:  Angela Rocha  is a 22 y.o. female presented with abdominal pain and fever   VITAL SIGNS:  Blood pressure 112/71, pulse 97, temperature 97.9 F (36.6 C), temperature source Oral, resp. rate 18, height 5\' 3"  (1.6 m), weight 63.8 kg (140 lb 9.6 oz), last menstrual period 05/11/2017, SpO2 100 %.   PHYSICAL EXAMINATION:  GENERAL:  22 y.o.-year-old patient lying in the bed with no acute  distress.  EYES: Pupils equal, round, reactive to light and accommodation. No scleral icterus. Extraocular muscles intact.  HEENT: Head atraumatic, normocephalic. Oropharynx and nasopharynx clear.  NECK:  Supple, no jugular venous distention. No thyroid enlargement, no tenderness.  LUNGS: Normal breath sounds bilaterally, no wheezing, rales,rhonchi or crepitation. No use of accessory muscles of respiration.  CARDIOVASCULAR: S1, S2 normal. No murmurs, rubs, or gallops.  ABDOMEN: Soft, non-tender, non-distended. Bowel sounds present. No organomegaly or mass.  EXTREMITIES: No pedal edema, cyanosis, or clubbing.  NEUROLOGIC: Cranial nerves II through XII are intact. Muscle strength 5/5 in all extremities. Sensation intact. Gait not checked.  PSYCHIATRIC: The patient is alert and oriented x 3.  SKIN: No obvious rash, lesion, or ulcer.   DATA REVIEW:   CBC Recent Labs  Lab 05/18/17 0439  WBC 10.0  HGB 9.8*  HCT 28.8*  PLT 171    Chemistries  Recent  Labs  Lab 05/16/17 1939 05/18/17 0439  NA 138 138  K 3.6 3.4*  CL 105 110  CO2 26 19*  GLUCOSE 89 107*  BUN 11 8  CREATININE 0.66 0.77  CALCIUM 8.9 7.8*  AST 17  --   ALT 13*  --   ALKPHOS 57  --   BILITOT 0.3  --     Microbiology Results  Results for orders placed or performed during the hospital encounter of 05/16/17  Urine Culture     Status: Abnormal   Collection Time: 05/16/17  7:46 PM  Result Value Ref Range Status   Specimen Description   Final    URINE, RANDOM Performed at [facility], [address] [place], [place] [address]    Special Requests   Final    NONE Performed at [facility], [address] [place], [place] [address]    Culture >=100,000 COLONIES/mL ESCHERICHIA COLI (A)  Final   Report Status 05/19/2017 FINAL  Final   Organism ID, Bacteria ESCHERICHIA COLI (A)  Final      Susceptibility   Escherichia coli - MIC*    AMPICILLIN >=32 RESISTANT Resistant     CEFAZOLIN <=4 SENSITIVE Sensitive     CEFTRIAXONE <=1 SENSITIVE Sensitive     CIPROFLOXACIN 0.5 SENSITIVE Sensitive     GENTAMICIN <=1 SENSITIVE Sensitive     IMIPENEM <=0.25 SENSITIVE Sensitive     NITROFURANTOIN <=16 SENSITIVE Sensitive     TRIMETH/SULFA >=320 RESISTANT Resistant     AMPICILLIN/SULBACTAM 16 INTERMEDIATE Intermediate     PIP/TAZO <=4 SENSITIVE Sensitive     Extended ESBL NEGATIVE Sensitive     * >=100,000 COLONIES/mL ESCHERICHIA COLI  CULTURE, BLOOD (ROUTINE X 2) w Reflex to ID Panel     Status: None (Preliminary result)   Collection Time: 05/18/17  2:37 PM  Result Value Ref Range Status   Specimen Description BLOOD  LEFT [facility]  Final   Special Requests   Final    BOTTLES DRAWN AEROBIC AND ANAEROBIC Blood Culture adequate volume   Culture   Final    NO GROWTH 2 DAYS Performed at [facility], [address] [place], [place] [address]    Report Status PENDING  Incomplete  CULTURE, BLOOD (ROUTINE X 2) w Reflex to ID Panel     Status: None  (Preliminary result)   Collection Time: 05/18/17  2:37 PM  Result Value Ref Range Status   Specimen Description BLOOD RIGHT ANTECUBITAL  Final   Special Requests   Final    BOTTLES DRAWN AEROBIC AND ANAEROBIC Blood  Culture adequate volume   Culture   Final    NO GROWTH 2 DAYS Performed at [facility], [address] [place], [place] [address]    Report Status PENDING  Incomplete    RADIOLOGY:  US Renal  Result Date: 05/19/2017 CLINICAL DATA:  Hydronephrosis. EXAM: RENAL / URINARY TRACT ULTRASOUND COMPLETE COMPARISON:  Body CT 05/17/2017 FINDINGS: Right Kidney: Length: 10.0 cm. Echogenicity within normal limits. No mass or hydronephrosis visualized. Left Kidney: Length: 10.0 cm. Echogenicity within normal limits. No mass or hydronephrosis visualized. Bladder: Appears normal for degree of bladder distention. Bilateral ureteral jets seen. IMPRESSION: Normal renal ultrasound. Electronically Signed   By: Fidela Salisbury M.D.   On: 05/19/2017 14:38    Management plans discussed with the patient, family and they are in agreement.  CODE STATUS:     Code Status Orders  (From admission, onward)        Start     Ordered   05/17/17 0547  Full code  Continuous     05/17/17 0546    Code Status History    This patient has a current code status but no historical code status.      TOTAL TIME TAKING CARE OF THIS PATIENT: 35 minutes.    Loletha Grayer M.D on 05/20/2017 at 2:20 PM  Between 7am to 6pm - Pager - [phone]  After 6pm go to [email or web address] - Proofreader  [employer] Office  [phone]  CC: Primary care physician; Wayland Denis, PA-C

## 2017-05-20 NOTE — Progress Notes (Signed)
Patient discharge teaching given, including activity, diet, follow-up appoints, and medications. Patient verbalized understanding of all discharge instructions. IV access was d/c'd. Vitals are stable. Skin is intact except as charted in most recent assessments. Pt to be escorted out by NT, to be driven home by family.  Angela Rocha  

## 2017-05-22 DIAGNOSIS — F411 Generalized anxiety disorder: Secondary | ICD-10-CM | POA: Insufficient documentation

## 2017-05-22 DIAGNOSIS — M21859 Other specified acquired deformities of unspecified thigh: Secondary | ICD-10-CM | POA: Insufficient documentation

## 2017-05-23 LAB — CULTURE, BLOOD (ROUTINE X 2)
Culture: NO GROWTH
Culture: NO GROWTH
Special Requests: ADEQUATE
Special Requests: ADEQUATE

## 2017-08-23 DIAGNOSIS — G43009 Migraine without aura, not intractable, without status migrainosus: Secondary | ICD-10-CM | POA: Insufficient documentation

## 2017-11-17 ENCOUNTER — Encounter: Payer: Self-pay | Admitting: Obstetrics and Gynecology

## 2017-11-17 ENCOUNTER — Ambulatory Visit: Payer: BC Managed Care – PPO | Admitting: Obstetrics and Gynecology

## 2017-11-17 VITALS — BP 112/84 | HR 91 | Ht 64.0 in | Wt 151.1 lb

## 2017-11-17 DIAGNOSIS — R5383 Other fatigue: Secondary | ICD-10-CM

## 2017-11-17 DIAGNOSIS — E538 Deficiency of other specified B group vitamins: Secondary | ICD-10-CM

## 2017-11-17 DIAGNOSIS — E559 Vitamin D deficiency, unspecified: Secondary | ICD-10-CM | POA: Diagnosis not present

## 2017-11-17 MED ORDER — CYANOCOBALAMIN 1000 MCG/ML IJ SOLN
1000.0000 ug | INTRAMUSCULAR | 1 refills | Status: DC
Start: 2017-11-17 — End: 2017-12-11

## 2017-11-17 MED ORDER — VITAMIN D (ERGOCALCIFEROL) 1.25 MG (50000 UNIT) PO CAPS: 50000.0000 [IU] | ORAL_CAPSULE | ORAL | 1 refills | Status: DC

## 2017-11-17 NOTE — Progress Notes (Signed)
  Subjective:     Patient ID: STEFFANI DIONISIO, female   DOB: 05-29-1995, 22 y.o.   MRN: [id number]  HPI Feeling tired and fatigued again, feels same as when B12 levels were low.  Hasn't taken shots or vit D supplement in over a year. Has tried OTC B12 but hasn't helped much.   Reports being very stressed as father diagnosed with stage 4 colon cancer, is a Conservation officer, historic buildings, and working 2 jobs. Concerned as had 30# weigh gain in last few months( after hip surgery)  Review of Systems  Constitutional: Positive for fatigue and unexpected weight change.  Psychiatric/Behavioral: Positive for decreased concentration and dysphoric mood. The patient is nervous/anxious.        Objective:   Physical Exam A&Ox4 Well groomed female in no distress Blood pressure 112/84, pulse 91, height '5\' 4"'$  (1.626 m), weight 151 lb 1.6 oz (68.5 kg), last menstrual period 10/16/2017.  Body mass index is 25.94 kg/m.  Thyroid not enlarged HRR Lungs clear Pelvic not indicated     Assessment:     Fatigue H/o B12 and Vit D deficiencies Unexplained weight gain    Plan:     Labs obtained- will follow up accordingly Counseled on common causes of weight gain and how stress effects that. Encouraged utilization of campus counseling services in dealing with dad's cancer diagnosis.  RTC as needed.  Melody Shambley,CNM

## 2017-11-18 LAB — CBC WITH DIFFERENTIAL/PLATELET
BASOS ABS: 0 10*3/uL (ref 0.0–0.2)
BASOS: 1 %
EOS (ABSOLUTE): 0 10*3/uL (ref 0.0–0.4)
Eos: 1 %
HEMATOCRIT: 34.3 % (ref 34.0–46.6)
HEMOGLOBIN: 10.9 g/dL — AB (ref 11.1–15.9)
IMMATURE GRANS (ABS): 0 10*3/uL (ref 0.0–0.1)
Immature Granulocytes: 0 %
LYMPHS ABS: 1.6 10*3/uL (ref 0.7–3.1)
LYMPHS: 35 %
MCH: 25.5 pg — ABNORMAL LOW (ref 26.6–33.0)
MCHC: 31.8 g/dL (ref 31.5–35.7)
MCV: 80 fL (ref 79–97)
MONOCYTES: 8 %
Monocytes Absolute: 0.4 10*3/uL (ref 0.1–0.9)
Neutrophils Absolute: 2.6 10*3/uL (ref 1.4–7.0)
Neutrophils: 55 %
Platelets: 238 10*3/uL (ref 150–450)
RBC: 4.27 x10E6/uL (ref 3.77–5.28)
RDW: 14.9 % (ref 12.3–15.4)
WBC: 4.6 10*3/uL (ref 3.4–10.8)

## 2017-11-18 LAB — THYROID PANEL WITH TSH
Free Thyroxine Index: 1.8 (ref 1.2–4.9)
T3 UPTAKE RATIO: 21 % — AB (ref 24–39)
T4 TOTAL: 8.8 ug/dL (ref 4.5–12.0)
TSH: 1.55 u[IU]/mL (ref 0.450–4.500)

## 2017-11-18 LAB — VITAMIN D 25 HYDROXY (VIT D DEFICIENCY, FRACTURES): VIT D 25 HYDROXY: 20.5 ng/mL — AB (ref 30.0–100.0)

## 2017-11-18 LAB — B12 AND FOLATE PANEL
FOLATE: 7.6 ng/mL (ref >3.0)
Vitamin B-12: 1067 pg/mL (ref 232–1245)

## 2017-11-21 ENCOUNTER — Other Ambulatory Visit: Payer: Self-pay | Admitting: Obstetrics and Gynecology

## 2017-11-21 MED ORDER — CITRANATAL BLOOM 90-1 MG PO TABS: 1.0000 | ORAL_TABLET | Freq: Every day | ORAL | 11 refills | Status: DC

## 2017-12-11 ENCOUNTER — Other Ambulatory Visit: Payer: Self-pay | Admitting: Obstetrics and Gynecology

## 2017-12-27 ENCOUNTER — Other Ambulatory Visit: Payer: Self-pay | Admitting: Obstetrics and Gynecology

## 2018-01-31 ENCOUNTER — Other Ambulatory Visit: Payer: Self-pay

## 2018-01-31 MED ORDER — NORETHIN ACE-ETH ESTRAD-FE 1-20 MG-MCG PO TABS: 1.0000 | ORAL_TABLET | Freq: Every day | ORAL | 11 refills | Status: DC

## 2018-01-31 NOTE — Telephone Encounter (Signed)
Script sent to preferred pharmacy- [place]

## 2018-04-08 ENCOUNTER — Telehealth: Payer: BC Managed Care – PPO | Admitting: Family

## 2018-04-08 DIAGNOSIS — R059 Cough, unspecified: Secondary | ICD-10-CM

## 2018-04-08 DIAGNOSIS — R05 Cough: Secondary | ICD-10-CM

## 2018-04-08 MED ORDER — PROMETHAZINE-DM 6.25-15 MG/5ML PO SYRP: 5.0000 mL | ORAL_SOLUTION | Freq: Four times a day (QID) | ORAL | 0 refills | Status: DC | PRN

## 2018-04-08 NOTE — Progress Notes (Signed)
E-Visit for Corona Virus Screening  Based on your current symptoms, you may very well have the virus, however your symptoms are mild. Currently, not all patients are being tested. If the symptoms are mild and there is not a known exposure, performing the test is not indicated.  Coronavirus disease 2019 (COVID-19) is a respiratory illness that can spread from person to person. The virus that causes COVID-19 is a new virus that was first identified in the country of [place] but is now found in multiple other countries and has spread to the [place].  Symptoms associated with the virus are mild to severe fever, cough, and shortness of breath. There is currently no vaccine to protect against COVID-19, and there is no specific antiviral treatment for the virus.   To be considered HIGH RISK for Coronavirus (COVID-19), you have to meet the following criteria:  . Traveled to [place], [place], [place], [place] or [place]; or in the [place] to [place], [place], [place], or [place]; and have fever, cough, and shortness of breath within the last 2 weeks of travel OR  . Been in close contact with a person diagnosed with COVID-19 within the last 2 weeks and have fever, cough, and shortness of breath  . IF YOU DO NOT MEET THESE CRITERIA, YOU ARE CONSIDERED LOW RISK FOR COVID-19.   It is vitally important that if you feel that you have an infection such as this virus or any other virus that you stay home and away from places where you may spread it to others.  You should self-quarantine for 14 days if you have symptoms that could potentially be coronavirus and avoid contact with people age 65 and older.   You can use medication such as A prescription cough medication called Tessalon Perles 100 mg. You may take 1-2 capsules every 8 hours as needed for cough  You may also take acetaminophen (Tylenol) as needed for fever.   Reduce your risk of any infection by using the same precautions used for  avoiding the common cold or flu:  . Wash your hands often with soap and warm water for at least 20 seconds.  If soap and water are not readily available, use an alcohol-based hand sanitizer with at least 60% alcohol.  . If coughing or sneezing, cover your mouth and nose by coughing or sneezing into the elbow areas of your shirt or coat, into a tissue or into your sleeve (not your hands). . Avoid shaking hands with others and consider head nods or verbal greetings only. . Avoid touching your eyes, nose, or mouth with unwashed hands.  . Avoid close contact with people who are sick. . Avoid places or events with large numbers of people in one location, like concerts or sporting events. . Carefully consider travel plans you have or are making. . If you are planning any travel outside or inside the [place], visit the CDC's Travelers' Health webpage for the latest health notices. . If you have some symptoms but not all symptoms, continue to monitor at home and seek medical attention if your symptoms worsen. . If you are having a medical emergency, call 911.  HOME CARE . Only take medications as instructed by your medical team. . Drink plenty of fluids and get plenty of rest. . A steam or ultrasonic humidifier can help if you have congestion.   GET HELP RIGHT AWAY IF: . You develop worsening fever. . You become short of breath . You cough   up blood. . Your symptoms become more severe MAKE SURE YOU   Understand these instructions.  Will watch your condition.  Will get help right away if you are not doing well or get worse.  Your e-visit answers were reviewed by a board certified advanced clinical practitioner to complete your personal care plan.  Depending on the condition, your plan could have included both over the counter or prescription medications.  If there is a problem please reply once you have received a response from your provider. Your safety is important to us.  If you have drug  allergies check your prescription carefully.    You can use MyChart to ask questions about today's visit, request a non-urgent call back, or ask for a work or school excuse for 24 hours related to this e-Visit. If it has been greater than 24 hours you will need to follow up with your provider, or enter a new e-Visit to address those concerns. You will get an e-mail in the next two days asking about your experience.  I hope that your e-visit has been valuable and will speed your recovery. Thank you for using e-visits.    

## 2018-04-19 ENCOUNTER — Telehealth: Payer: Self-pay | Admitting: Emergency Medicine

## 2018-04-19 NOTE — Telephone Encounter (Signed)
Called and spoke with patient regarding her asthma and covid19 concerns Pt is in need of a letter for her work and her school apartment stating that she has asthma and is higher risk for covid19, she has recently had bronchitis and still recovering from it. Pt is not able to drive back to [place]/school therefore is in need of this letter to be able to stay home and isolated during the time of the covid19.  RB we are happy to write this letter, please advise if this is okay. Thank you.

## 2018-04-20 NOTE — Telephone Encounter (Signed)
Yes please write the letter for her

## 2018-04-23 NOTE — Telephone Encounter (Signed)
Left message for patient stating that the letters had been written and signed. Just need to know if she wants [place] to place these in the mail for her.   Will keep encounter open until she calls back.

## 2018-04-24 NOTE — Telephone Encounter (Signed)
Attempted to call pt but unable to reach. Left message for pt to return call. 

## 2018-04-25 NOTE — Telephone Encounter (Signed)
Letter has been placed in the mail 

## 2018-04-25 NOTE — Telephone Encounter (Signed)
LMTCB

## 2018-07-09 ENCOUNTER — Other Ambulatory Visit: Payer: Self-pay | Admitting: Certified Nurse Midwife

## 2018-07-10 ENCOUNTER — Telehealth: Payer: Self-pay

## 2018-07-10 NOTE — Telephone Encounter (Signed)
Per pt request- junel sent to [place].

## 2018-08-10 ENCOUNTER — Ambulatory Visit: Payer: BC Managed Care – PPO | Admitting: Pulmonary Disease

## 2018-08-10 ENCOUNTER — Ambulatory Visit: Payer: Self-pay | Admitting: Emergency Medicine

## 2018-08-13 NOTE — Progress Notes (Signed)
@Patient  ID: Angela Rocha, female    DOB: [birth year], 23 y.o.   MRN: [id number]  Chief Complaint  Patient presents with  . Follow-up    Yearly asthma follow-up    Referring provider: Wayland Denis, PA-C  HPI:  23 year old female followed in our office for asthma  PMH: Sinusitis, history of bronchitis Smoker/ Smoking History: Never smoker Maintenance: Wixsella Pt of: Dr. Lamonte Sakai  08/14/2018  - Visit   23 year old female never smoker followed in our office for asthma.  Patient is presenting today for her yearly asthma follow-up.  Patient reports that in February/2020 she had a recurrent episode of bronchitis that also occurred in March/2020.  At that point in time patient was treated for bronchitis as well as inhaler was switched from Symbicort 160 to [place].  Patient reports she is been maintained well on this.  Patient is under the impression she may have had SARS-CoV-2 in March/2020 based off of bronchitis symptoms.  She was never in fact tested.  Patient reports that overall her breathing has been well managed.  She has to use her rescue inhaler 2-3 times weekly due to having to wear a mask.  She reports that the mask worsens her breathing.  Patient is planning on returning back to [employer] [facility].  She reports that she will have to wear a mask in class as well as when walking to and from class.   Tests:   FENO:  No results found for: NITRICOXIDE  PFT: PFT Results Latest Ref Rng & Units 02/22/2016  FVC-Pre L 3.09  FVC-Predicted Pre % 82  FVC-Post L 3.13  FVC-Predicted Post % 83  Pre FEV1/FVC % % 84  Post FEV1/FCV % % 95  FEV1-Pre L 2.60  FEV1-Predicted Pre % 77  FEV1-Post L 2.97    Imaging: No results found.    Specialty Problems      Pulmonary Problems   Intrinsic asthma      Allergies  Allergen Reactions  . Amoxicillin-Pot Clavulanate Rash and Other (See Comments)    Has patient had a PCN reaction causing immediate rash, facial/tongue/throat  swelling, SOB or lightheadedness with hypotension: Yes Has patient had a PCN reaction causing severe rash involving mucus membranes or skin necrosis: No Has patient had a PCN reaction that required hospitalization: No Has patient had a PCN reaction occurring within the last 10 years: No If all of the above answers are "NO", then may proceed with Cephalosporin use.     Immunization History  Administered Date(s) Administered  . Influenza,inj,Quad PF,6+ Mos 10/26/2017  . Meningococcal Conjugate 08/15/2016    Past Medical History:  Diagnosis Date  . ADHD   . Anxiety   . Asthma   . Migraine   . Pelvic congestion syndrome     Tobacco History: Social History   Tobacco Use  Smoking Status Never Smoker  Smokeless Tobacco Never Used   Counseling given: Yes   Continue to not smoke  Outpatient Encounter Medications as of 08/14/2018  Medication Sig  . acetaminophen (TYLENOL) 325 MG tablet Take 2 tablets (650 mg total) by mouth every 6 (six) hours as needed for mild pain (or Fever >/= 101).  Marland Kitchen albuterol (PROVENTIL HFA;VENTOLIN HFA) 108 (90 Base) MCG/ACT inhaler Inhale 2 puffs into the lungs every 6 (six) hours as needed for wheezing or shortness of breath.  . amphetamine-dextroamphetamine (ADDERALL) 15 MG tablet Take 15 mg by mouth daily.  . cetirizine (ZYRTEC) 10 MG tablet Take 10 mg by mouth daily.  Marland Kitchen  cyanocobalamin (,VITAMIN B-12,) 1000 MCG/ML injection INJECT 1 ML (1,000 MCG TOTAL) INTO THE MUSCLE EVERY 30 (THIRTY) DAYS.  Marland Kitchen dexmethylphenidate (FOCALIN) 2.5 MG tablet Take 2.5 mg by mouth daily.  Marland Kitchen FLUoxetine (PROZAC) 20 MG capsule Take 20 mg by mouth daily.   Marland Kitchen ibuprofen (ADVIL,MOTRIN) 600 MG tablet Take 1 tablet (600 mg total) by mouth every 6 (six) hours as needed for moderate pain.  Lenda Kelp FE 1/20 1-20 MG-MCG tablet TAKE 1 TABLET BY MOUTH EVERY DAY  . nortriptyline (PAMELOR) 10 MG capsule Take 20 mg by mouth at bedtime.  . polyethylene glycol (MIRALAX / GLYCOLAX) packet Take 17  g by mouth daily as needed.  . Prenatal-DSS-FeCb-FeGl-FA (CITRANATAL BLOOM) 90-1 MG TABS Take 1 tablet by mouth daily.  . rizatriptan (MAXALT-MLT) 10 MG disintegrating tablet Take 10 mg by mouth as directed. TAKE 1 TAB AT HEADACHE ONSET MAY TAKE A 2ND DOSE AFTER 2 HOURS IF NEEDED NO MORE THAN 2 DOSES/24 HRS  . tranexamic acid (LYSTEDA) 650 MG TABS tablet Take 2 tablets (1,300 mg total) by mouth 3 (three) times daily. Take during menses for a maximum of five days  . Vitamin D, Ergocalciferol, (DRISDOL) 1.25 MG (50000 UT) CAPS capsule Take 1 capsule (50,000 Units total) by mouth every 7 (seven) days.  . [DISCONTINUED] Fluticasone-Salmeterol (WIXELA INHUB) 250-50 MCG/DOSE AEPB Inhale 1 puff into the lungs 2 (two) times daily.  Marland Kitchen albuterol (PROVENTIL) (2.5 MG/3ML) 0.083% nebulizer solution Take 3 mLs (2.5 mg total) by nebulization every 6 (six) hours as needed for wheezing or shortness of breath.  . budesonide-formoterol (SYMBICORT) 160-4.5 MCG/ACT inhaler Inhale 2 puffs into the lungs 2 (two) times daily.  . [DISCONTINUED] budesonide-formoterol (SYMBICORT) 160-4.5 MCG/ACT inhaler Inhale 2 puffs into the lungs 2 (two) times daily. (Patient not taking: Reported on 08/14/2018)  . [DISCONTINUED] HYDROcodone-acetaminophen (NORCO/VICODIN) 5-325 MG tablet Take 1 tablet by mouth every 6 (six) hours as needed for severe pain. (Patient not taking: Reported on 11/17/2017)  . [DISCONTINUED] lisdexamfetamine (VYVANSE) 20 MG capsule Take 20 mg by mouth daily.   . [DISCONTINUED] phenazopyridine (PYRIDIUM) 200 MG tablet Take 1 tablet (200 mg total) by mouth 3 (three) times daily with meals. (Patient not taking: Reported on 11/17/2017)  . [DISCONTINUED] promethazine-dextromethorphan (PROMETHAZINE-DM) 6.25-15 MG/5ML syrup Take 5 mLs by mouth 4 (four) times daily as needed.   No facility-administered encounter medications on file as of 08/14/2018.      Review of Systems  Review of Systems  Constitutional: Negative for  activity change, fatigue and fever.  HENT: Negative for sinus pressure, sinus pain and sore throat.   Respiratory: Negative for cough, shortness of breath and wheezing.   Cardiovascular: Negative for chest pain and palpitations.  Gastrointestinal: Negative for diarrhea, nausea and vomiting.  Musculoskeletal: Negative for arthralgias.  Neurological: Negative for dizziness.  Psychiatric/Behavioral: Negative for sleep disturbance. The patient is not nervous/anxious.      Physical Exam  BP 108/64 (BP Location: Left Arm, Cuff Size: Normal)   Pulse 96   Temp 98.1 F (36.7 C) (Oral)   Ht 5\' 4"  (1.626 m)   Wt 149 lb 6.4 oz (67.8 kg)   SpO2 98%   BMI 25.64 kg/m   Wt Readings from Last 5 Encounters:  08/14/18 149 lb 6.4 oz (67.8 kg)  11/17/17 151 lb 1.6 oz (68.5 kg)  05/20/17 140 lb 9.6 oz (63.8 kg) (70 %, Z= 0.54)*  09/09/16 127 lb (57.6 kg) (52 %, Z= 0.04)*  05/10/16 128 lb 6.4 oz (58.2  kg) (56 %, Z= 0.15)*   * Growth percentiles are based on CDC (Girls, 2-20 Years) data.     Physical Exam Vitals signs and nursing note reviewed.  Constitutional:      General: She is not in acute distress.    Appearance: Normal appearance.  HENT:     Head: Normocephalic and atraumatic.     Right Ear: Tympanic membrane, ear canal and external ear normal. There is no impacted cerumen.     Left Ear: Tympanic membrane, ear canal and external ear normal. There is no impacted cerumen.     Nose: Nose normal. No congestion.     Mouth/Throat:     Mouth: Mucous membranes are moist.     Pharynx: Oropharynx is clear.  Eyes:     Pupils: Pupils are equal, round, and reactive to light.  Neck:     Musculoskeletal: Normal range of motion.  Cardiovascular:     Rate and Rhythm: Normal rate and regular rhythm.     Pulses: Normal pulses.     Heart sounds: Normal heart sounds. No murmur.  Pulmonary:     Effort: Pulmonary effort is normal. No respiratory distress.     Breath sounds: Normal breath sounds. No  decreased air movement. No decreased breath sounds, wheezing or rales.  Skin:    General: Skin is warm and dry.  Neurological:     General: No focal deficit present.     Mental Status: She is alert and oriented to person, place, and time. Mental status is at baseline.     Gait: Gait normal.  Psychiatric:        Mood and Affect: Mood normal.        Behavior: Behavior normal.        Thought Content: Thought content normal.        Judgment: Judgment normal.      Lab Results:  CBC    Component Value Date/Time   WBC 4.6 11/17/2017 1434   WBC 10.0 05/18/2017 0439   RBC 4.27 11/17/2017 1434   RBC 3.25 (L) 05/18/2017 0439   HGB 10.9 (L) 11/17/2017 1434   HCT 34.3 11/17/2017 1434   PLT 238 11/17/2017 1434   MCV 80 11/17/2017 1434   MCH 25.5 (L) 11/17/2017 1434   MCH 30.2 05/18/2017 0439   MCHC 31.8 11/17/2017 1434   MCHC 34.0 05/18/2017 0439   RDW 14.9 11/17/2017 1434   LYMPHSABS 1.6 11/17/2017 1434   EOSABS 0.0 11/17/2017 1434   BASOSABS 0.0 11/17/2017 1434    BMET    Component Value Date/Time   NA 138 05/18/2017 0439   K 3.4 (L) 05/18/2017 0439   CL 110 05/18/2017 0439   CO2 19 (L) 05/18/2017 0439   GLUCOSE 107 (H) 05/18/2017 0439   BUN 8 05/18/2017 0439   CREATININE 0.77 05/18/2017 0439   CALCIUM 7.8 (L) 05/18/2017 0439   GFRNONAA >60 05/18/2017 0439   GFRAA >60 05/18/2017 0439    BNP No results found for: BNP  ProBNP No results found for: PROBNP    Assessment & Plan:   Intrinsic asthma Maintained on wixella, patient preferred Symbicort 160 he is wondering if she could switch back Rescue inhaler use 2-3 times weekly Patient also has a nebulizer but she needs a refill for albuterol nebs prior to going back to school Breath sounds clear on auscultation today  Plan: Stop wixella Resume Symbicort 160 >>> Coupon provided today Okay to use either albuterol nebulize meds or albuterol rescue inhaler  every 6 hours as needed for shortness of breath or  wheezing Refill of albuterol nebs sent to pharmacy Continue daily loratadine   Patient to return back to school and follow social distancing and mass guidelines, note provided today to allow patient to take 1 to 5-minute breaks if she is having difficulty with her breathing while wearing a mask    Return in about 1 year (around 08/14/2019), or if symptoms worsen or fail to improve, for Follow up with Dr. Lamonte Sakai.   Lauraine Rinne, NP 08/14/2018   This appointment was 18 minutes long with over 50% of the time in direct face-to-face patient care, assessment, plan of care, and follow-up.

## 2018-08-14 ENCOUNTER — Other Ambulatory Visit: Payer: Self-pay

## 2018-08-14 ENCOUNTER — Encounter: Payer: Self-pay | Admitting: Pulmonary Disease

## 2018-08-14 ENCOUNTER — Ambulatory Visit (INDEPENDENT_AMBULATORY_CARE_PROVIDER_SITE_OTHER): Payer: BC Managed Care – PPO | Admitting: Pulmonary Disease

## 2018-08-14 DIAGNOSIS — J45909 Unspecified asthma, uncomplicated: Secondary | ICD-10-CM

## 2018-08-14 MED ORDER — ALBUTEROL SULFATE (2.5 MG/3ML) 0.083% IN NEBU: 2.5000 mg | INHALATION_SOLUTION | Freq: Four times a day (QID) | RESPIRATORY_TRACT | 12 refills | Status: DC | PRN

## 2018-08-14 MED ORDER — BUDESONIDE-FORMOTEROL FUMARATE 160-4.5 MCG/ACT IN AERO: 2.0000 | INHALATION_SPRAY | Freq: Two times a day (BID) | RESPIRATORY_TRACT | 5 refills | Status: DC

## 2018-08-14 NOTE — Assessment & Plan Note (Addendum)
Maintained on Angela Rocha, patient preferred Symbicort 160 he is wondering if she could switch back Rescue inhaler use 2-3 times weekly Patient also has a nebulizer but she needs a refill for albuterol nebs prior to going back to school Breath sounds clear on auscultation today  Plan: Stop Angela Rocha Resume Symbicort 160 >>> Coupon provided today Okay to use either albuterol nebulize meds or albuterol rescue inhaler every 6 hours as needed for shortness of breath or wheezing Refill of albuterol nebs sent to pharmacy Continue daily loratadine   Patient to return back to school and follow social distancing and mass guidelines, note provided today to allow patient to take 1 to 5-minute breaks if she is having difficulty with her breathing while wearing a mask

## 2018-08-14 NOTE — Patient Instructions (Addendum)
Restart Symbicort 160 >>> 2 puffs in the morning right when you wake up, rinse out your mouth after use, 12 hours later 2 puffs, rinse after use >>> Take this daily, no matter what >>> This is not a rescue inhaler   We will also give you a spacer today to use with your Symbicort as well as her rescue inhaler as able  We will send in a prescription for albuterol nebulized meds for you to be able to use every 6 hours as needed for shortness of breath or wheezing   Only use your albuterol as a rescue medication to be used if you can't catch your breath by resting or doing a relaxed purse lip breathing pattern.  - The less you use it, the better it will work when you need it. - Ok to use up to 2 puffs  every 6 hours if you must but call for immediate appointment if use goes up over your usual need - Don't leave home without it !!  (think of it like the spare tire for your car)    Return in about 1 year (around 08/14/2019), or if symptoms worsen or fail to improve, for Follow up with Dr. Lamonte Sakai.   Coronavirus (COVID-19) Are you at risk?  Are you at risk for the Coronavirus (COVID-19)?  To be considered HIGH RISK for Coronavirus (COVID-19), you have to meet the following criteria:  . Traveled to [place], [place], [place], [place] or [place]; or in the [place] to [place], [place], [place], or [place]; and have fever, cough, and shortness of breath within the last 2 weeks of travel OR . Been in close contact with a person diagnosed with COVID-19 within the last 2 weeks and have fever, cough, and shortness of breath . IF YOU DO NOT MEET THESE CRITERIA, YOU ARE CONSIDERED LOW RISK FOR COVID-19.  What to do if you are HIGH RISK for COVID-19?  Marland Kitchen If you are having a medical emergency, call 911. . Seek medical care right away. Before you go to a doctor's office, urgent care or emergency department, call ahead and tell them about your recent travel, contact with someone diagnosed with  COVID-19, and your symptoms. You should receive instructions from your physician's office regarding next steps of care.  . When you arrive at healthcare provider, tell the healthcare staff immediately you have returned from visiting [place], [place], [place], [place] or [place]; or traveled in the [place] to [place], [place], [place], or [place]; in the last two weeks or you have been in close contact with a person diagnosed with COVID-19 in the last 2 weeks.   . Tell the health care staff about your symptoms: fever, cough and shortness of breath. . After you have been seen by a medical provider, you will be either: o Tested for (COVID-19) and discharged home on quarantine except to seek medical care if symptoms worsen, and asked to  - Stay home and avoid contact with others until you get your results (4-5 days)  - Avoid travel on public transportation if possible (such as bus, train, or airplane) or o Sent to the Emergency Department by EMS for evaluation, COVID-19 testing, and possible admission depending on your condition and test results.  What to do if you are LOW RISK for COVID-19?  Reduce your risk of any infection by using the same precautions used for avoiding the common cold or flu:  Marland Kitchen Wash your hands often with  soap and warm water for at least 20 seconds.  If soap and water are not readily available, use an alcohol-based hand sanitizer with at least 60% alcohol.  . If coughing or sneezing, cover your mouth and nose by coughing or sneezing into the elbow areas of your shirt or coat, into a tissue or into your sleeve (not your hands). . Avoid shaking hands with others and consider head nods or verbal greetings only. . Avoid touching your eyes, nose, or mouth with unwashed hands.  . Avoid close contact with people who are sick. . Avoid places or events with large numbers of people in one location, like concerts or sporting events. . Carefully consider travel plans you have or  are making. . If you are planning any travel outside or inside the [place], visit the CDC's Travelers' Health webpage for the latest health notices. . If you have some symptoms but not all symptoms, continue to monitor at home and seek medical attention if your symptoms worsen. . If you are having a medical emergency, call 911.   [place] / e-Visit: [email or web address]         [facility]: [facility]: [phone]                   [facility]: [phone]           It is flu season:   >>> Best ways to protect herself from the flu: Receive the yearly flu vaccine, practice good hand hygiene washing with soap and also using hand sanitizer when available, eat a nutritious meals, get adequate rest, hydrate appropriately   Please contact the office if your symptoms worsen or you have concerns that you are not improving.   Thank you for choosing [facility] for your healthcare, and for allowing [place] to partner with you on your healthcare journey. I am thankful to be able to provide care to you today.   Wyn Quaker FNP-C

## 2018-08-16 ENCOUNTER — Other Ambulatory Visit: Payer: Self-pay | Admitting: Pulmonary Disease

## 2018-08-16 NOTE — Telephone Encounter (Signed)
Please advise 

## 2018-08-16 NOTE — Telephone Encounter (Signed)
Patient requested to be placed on Symbicort 160 at last office visit.  Has this changed?Please contact the patient and see which inhaler she would prefer to be on?Wyn Quaker, FNP

## 2018-12-03 DIAGNOSIS — D649 Anemia, unspecified: Secondary | ICD-10-CM | POA: Insufficient documentation

## 2018-12-24 ENCOUNTER — Telehealth: Payer: Self-pay

## 2018-12-24 NOTE — Telephone Encounter (Signed)
Pt called for refills Junel FE. Confirmed script written 07/10/18 with six refills. Pt states pharmacist only allowed her to get three.  Pt informed three more refills on script. Advised to call pharmacy to confirm so she can pick up at [place].

## 2019-01-18 ENCOUNTER — Other Ambulatory Visit: Payer: Self-pay | Admitting: Certified Nurse Midwife

## 2019-10-30 ENCOUNTER — Emergency Department
Admission: EM | Admit: 2019-10-30 | Discharge: 2019-10-30 | Disposition: A | Discharge status: Home / Self Care | Payer: BC Managed Care – PPO | Attending: Emergency Medicine | Admitting: Emergency Medicine

## 2019-10-30 ENCOUNTER — Emergency Department: Payer: BC Managed Care – PPO

## 2019-10-30 ENCOUNTER — Other Ambulatory Visit: Payer: Self-pay

## 2019-10-30 ENCOUNTER — Encounter: Payer: Self-pay | Admitting: Emergency Medicine

## 2019-10-30 DIAGNOSIS — J189 Pneumonia, unspecified organism: Secondary | ICD-10-CM | POA: Diagnosis not present

## 2019-10-30 DIAGNOSIS — R519 Headache, unspecified: Secondary | ICD-10-CM | POA: Insufficient documentation

## 2019-10-30 DIAGNOSIS — Z20822 Contact with and (suspected) exposure to covid-19: Secondary | ICD-10-CM | POA: Diagnosis not present

## 2019-10-30 DIAGNOSIS — Z7951 Long term (current) use of inhaled steroids: Secondary | ICD-10-CM | POA: Insufficient documentation

## 2019-10-30 DIAGNOSIS — J45909 Unspecified asthma, uncomplicated: Secondary | ICD-10-CM | POA: Insufficient documentation

## 2019-10-30 DIAGNOSIS — R059 Cough, unspecified: Secondary | ICD-10-CM | POA: Diagnosis present

## 2019-10-30 LAB — RESPIRATORY PANEL BY RT PCR (FLU A&B, COVID)
Influenza A by PCR: NEGATIVE
Influenza B by PCR: NEGATIVE
SARS Coronavirus 2 by RT PCR: NEGATIVE

## 2019-10-30 MED ORDER — PSEUDOEPH-BROMPHEN-DM 30-2-10 MG/5ML PO SYRP: 10.0000 mL | ORAL_SOLUTION | Freq: Four times a day (QID) | ORAL | 0 refills | Status: DC | PRN

## 2019-10-30 MED ORDER — DIPHENHYDRAMINE HCL 50 MG/ML IJ SOLN
50.0000 mg | Freq: Once | INTRAMUSCULAR | Status: AC
  Administered 2019-10-30: 50 mg via INTRAMUSCULAR
  Filled 2019-10-30: qty 1

## 2019-10-30 MED ORDER — KETOROLAC TROMETHAMINE 30 MG/ML IJ SOLN
30.0000 mg | Freq: Once | INTRAMUSCULAR | Status: AC
  Administered 2019-10-30: 30 mg via INTRAMUSCULAR
  Filled 2019-10-30: qty 1

## 2019-10-30 MED ORDER — PREDNISONE 50 MG PO TABS: 50.0000 mg | ORAL_TABLET | Freq: Every day | ORAL | 0 refills | Status: DC

## 2019-10-30 MED ORDER — IPRATROPIUM-ALBUTEROL 0.5-2.5 (3) MG/3ML IN SOLN
3.0000 mL | Freq: Once | RESPIRATORY_TRACT | Status: AC
  Administered 2019-10-30: 3 mL via RESPIRATORY_TRACT
  Filled 2019-10-30: qty 3

## 2019-10-30 MED ORDER — ONDANSETRON 4 MG PO TBDP: 4.0000 mg | ORAL_TABLET | Freq: Three times a day (TID) | ORAL | 0 refills | Status: DC | PRN

## 2019-10-30 MED ORDER — PROMETHAZINE HCL 25 MG/ML IJ SOLN
25.0000 mg | Freq: Once | INTRAMUSCULAR | Status: AC
  Administered 2019-10-30: 25 mg via INTRAMUSCULAR
  Filled 2019-10-30: qty 1

## 2019-10-30 MED ORDER — RIZATRIPTAN BENZOATE 10 MG PO TBDP: 10.0000 mg | ORAL_TABLET | ORAL | 1 refills | Status: DC | PRN

## 2019-10-30 NOTE — ED Notes (Signed)
Pt presents to the ED for cough, body aches, and fatigue. Pt is A&Ox4 and NAD. Pt also states she gets migraines when she is under stressful situation so pt states she is having a migraine also.

## 2019-10-30 NOTE — ED Provider Notes (Signed)
[facility] Emergency Department Provider Note  ____________________________________________  Time seen: Approximately 3:07 PM  I have reviewed the triage vital signs and the nursing notes.   HISTORY  Chief Complaint Cough and Near Syncope    HPI Angela Rocha is a 24 y.o. female who presents the emergency department complaining of headache, body aches, nasal congestion, cough, emesis.  Patient states that she had a worsening of her symptoms on Monday.   Patient states that she originally had felt bad at the start of the month.  Patient had been seen by her primary care on the fifth of this month according to her medical record and diagnosed with a viral upper respiratory infection.  Negative for Covid at that time.  Patient states that she had started to feel slightly better, thought she was improving when she worsened on Monday.  She does have a history of asthma, has been using her nebulizer machine every 4-6 hours.  She states that this helps with her symptoms, then slowly worsens throughout the time.  She is not taking any steroids, antibiotics at this time.  She has taken some Motrin for her headache.  No neck pain or stiffness.  Patient denies any difficulty breathing.  Patient does have some posttussive emesis but states that there is no bilious vomit.  No abdominal pain.  No urinary symptoms.  No GI complaints.  Patient is primarily complaining being of worsening cough, nausea, body aches and headache.  Patient would like a migraine cocktail as she does have a history of migraines, is out of her Maxalt.        Past Medical History:  Diagnosis Date  . ADHD   . Anxiety   . Asthma   . Migraine   . Pelvic congestion syndrome     Patient Active Problem List   Diagnosis Date Noted  . Pyelonephritis 05/17/2017  . 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 Vitamin D deficiency 01/12/2016  . B12 deficiency 01/12/2016  . 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  Quadriceps tendinitis 01/06/2015  . Femoral neck stress fracture 01/06/2015  . 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 Cephalalgia 03/12/2014    Past Surgical History:  Procedure Laterality Date  . HIP SURGERY    . TONSILLECTOMY      Prior to Admission medications   Medication Sig Start Date End Date Taking? Authorizing Provider  acetaminophen (TYLENOL) 325 MG tablet Take 2 tablets (650 mg total) by mouth every 6 (six) hours as needed for mild pain (or Fever >/= 101). 05/20/17   Loletha Grayer, MD  albuterol (PROVENTIL HFA;VENTOLIN HFA) 108 (90 Base) MCG/ACT inhaler Inhale 2 puffs into the lungs every 6 (six) hours as needed for wheezing or shortness of breath. 05/10/16   Collene Gobble, MD  albuterol (PROVENTIL) (2.5 MG/3ML) 0.083% nebulizer solution Take 3 mLs (2.5 mg total) by nebulization every 6 (six) hours as needed for wheezing or shortness of breath. 08/14/18   Lauraine Rinne, NP  amphetamine-dextroamphetamine (ADDERALL) 15 MG tablet Take 15 mg by mouth daily.    [provider]  brompheniramine-pseudoephedrine-DM 30-2-10 MG/5ML syrup Take 10 mLs by mouth 4 (four) times daily as needed. 10/30/19   Jeanie Mccard, Charline Bills, PA-C  budesonide-formoterol (SYMBICORT) 160-4.5 MCG/ACT inhaler Inhale 2 puffs into the lungs 2 (two) times daily. 08/14/18   Lauraine Rinne, NP  cetirizine (ZYRTEC) 10 MG tablet Take 10 mg by mouth daily. 04/17/17   [provider]  cyanocobalamin (,VITAMIN B-12,) 1000  MCG/ML injection INJECT 1 ML (1,000 MCG TOTAL) INTO THE MUSCLE EVERY 30 (THIRTY) DAYS. 12/11/17   Shambley, Melody N, CNM  dexmethylphenidate (FOCALIN) 2.5 MG tablet Take 2.5 mg by mouth daily. 01/30/17   [provider]  FLUoxetine (PROZAC) 20 MG capsule Take 20 mg by mouth daily.  03/17/17   [provider]  ibuprofen (ADVIL,MOTRIN) 600 MG tablet Take 1 tablet (600 mg total) by mouth every 6 (six) hours as needed for moderate pain. 05/20/17   Loletha Grayer, MD  JUNEL FE 1/20 1-20  MG-MCG tablet TAKE 1 TABLET BY MOUTH EVERY DAY 01/18/19   Philip Aspen, CNM  nortriptyline (PAMELOR) 10 MG capsule Take 20 mg by mouth at bedtime. 04/25/17   [provider]  ondansetron (ZOFRAN-ODT) 4 MG disintegrating tablet Take 1 tablet (4 mg total) by mouth every 8 (eight) hours as needed for nausea or vomiting. 10/30/19   Rutger Salton, Charline Bills, PA-C  polyethylene glycol (MIRALAX / GLYCOLAX) packet Take 17 g by mouth daily as needed. 05/20/17   Loletha Grayer, MD  predniSONE (DELTASONE) 50 MG tablet Take 1 tablet (50 mg total) by mouth daily with breakfast. 10/30/19   Hamlin Devine, Roderic Palau D, PA-C  Prenatal-DSS-FeCb-FeGl-FA (CITRANATAL BLOOM) 90-1 MG TABS Take 1 tablet by mouth daily. 11/21/17   Shambley, Melody N, CNM  rizatriptan (MAXALT-MLT) 10 MG disintegrating tablet Take 10 mg by mouth as directed. TAKE 1 TAB AT HEADACHE ONSET MAY TAKE A 2ND DOSE AFTER 2 HOURS IF NEEDED NO MORE THAN 2 DOSES/24 HRS 04/25/17   [provider]  rizatriptan (MAXALT-MLT) 10 MG disintegrating tablet Take 1 tablet (10 mg total) by mouth as needed for migraine. Take 1 tablet to start a headache.  May repeat in 2 hours if needed.  Do not take more than 2 doses per 24 hours 10/30/19   Mavis Gravelle, Roderic Palau D, PA-C  tranexamic acid (LYSTEDA) 650 MG TABS tablet Take 2 tablets (1,300 mg total) by mouth 3 (three) times daily. Take during menses for a maximum of five days 01/07/16   Shambley, Melody N, CNM  Vitamin D, Ergocalciferol, (DRISDOL) 1.25 MG (50000 UT) CAPS capsule Take 1 capsule (50,000 Units total) by mouth every 7 (seven) days. 11/17/17   Shambley, Melody N, CNM    Allergies Amoxicillin-pot clavulanate  Family History  Problem Relation Age of Onset  . Diabetes Mellitus II Father     Social History Social History   Tobacco Use  . Smoking status: Never Smoker  . Smokeless tobacco: Never Used  Substance Use Topics  . Alcohol use: No    Alcohol/week: 0.0 standard drinks  . Drug use: No      Review of Systems  Constitutional: No fever/chills.  Positive for body aches Eyes: No visual changes. No discharge ENT: No upper respiratory complaints. Cardiovascular: no chest pain. Respiratory: Positive cough. No SOB. Gastrointestinal: No abdominal pain.  Positive for nausea and posttussive emesis..  No diarrhea.  No constipation. Genitourinary: Negative for dysuria. No hematuria Musculoskeletal: Negative for musculoskeletal pain. Skin: Negative for rash, abrasions, lacerations, ecchymosis. Neurological: Positive for headache but denies focal weakness or numbness.  10 System ROS otherwise negative.  ____________________________________________   PHYSICAL EXAM:  VITAL SIGNS: ED Triage Vitals  Enc Vitals Group     BP 10/30/19 1301 125/83     Pulse Rate 10/30/19 1301 72     Resp 10/30/19 1301 18     Temp 10/30/19 1301 98.8 F (37.1 C)     Temp Source 10/30/19 1301 Oral  SpO2 10/30/19 1301 100 %     Weight 10/30/19 1241 160 lb (72.6 kg)     Height 10/30/19 1241 5\' 3"  (1.6 m)     Head Circumference --      Peak Flow --      Pain Score 10/30/19 1241 7     Pain Loc --      Pain Edu? --      Excl. in [place]? --      Constitutional: Alert and oriented. Well appearing and in no acute distress. Eyes: Conjunctivae are normal. PERRL. EOMI. Head: Atraumatic. ENT:      Ears: EACs unremarkable bilaterally.  TMs are minimally effused bilaterally.  No injection.  No air-fluid level.      Nose: Moderate clear congestion/rhinnorhea.      Mouth/Throat: Mucous membranes are moist.  Oropharynx is minimally erythematous but nonedematous.  Uvula is midline. Neck: No stridor.  No cervical spine tenderness to palpation.  Neck is supple full range of motion. Hematological/Lymphatic/Immunilogical: No cervical lymphadenopathy. Cardiovascular: Normal rate, regular rhythm. Normal S1 and S2.  Good peripheral circulation. Respiratory: Normal respiratory effort without tachypnea or  retractions. Lungs with faint expiratory wheezing in bilateral lower lung fields.  No expiratory wheezing.  No rales or rhonchi.Kermit Balo air entry to the bases with no decreased or absent breath sounds. Gastrointestinal: Bowel sounds 4 quadrants. Soft and nontender to palpation. No guarding or rigidity. No palpable masses. No distention. No CVA tenderness. Musculoskeletal: Full range of motion to all extremities. No gross deformities appreciated. Neurologic:  Normal speech and language. No gross focal neurologic deficits are appreciated.  Skin:  Skin is warm, dry and intact. No rash noted. Psychiatric: Mood and affect are normal. Speech and behavior are normal. Patient exhibits appropriate insight and judgement.   ____________________________________________   LABS (all labs ordered are listed, but only abnormal results are displayed)  Labs Reviewed  RESPIRATORY PANEL BY RT PCR (FLU A&B, COVID)   ____________________________________________  EKG  ED ECG REPORT I, Charline Bills Quida Glasser,  personally viewed and interpreted this ECG.   Date: 10/30/2019  EKG Time: 1251 hrs.  Rate: 81 bpm  Rhythm: there are no previous tracings available for comparison, normal sinus rhythm, sinus arrhythmia  Axis: Normal axis  Intervals:none  ST&T Change: No ST elevation or depression noted  Normal sinus rhythm with sinus arrhythmia.  No STEMI.  No previous EKGs for comparison.  ____________________________________________  RADIOLOGY I personally viewed and evaluated these images as part of my medical decision making, as well as reviewing the written report by the radiologist.  ED Provider Interpretation: I concur with radiologist finding of no acute cardiopulmonary abnormality  DG Chest 2 View  Result Date: 10/30/2019 CLINICAL DATA:  Cough and pain EXAM: CHEST - 2 VIEW COMPARISON:  None. FINDINGS: Lungs are clear. Heart size and pulmonary vascularity are normal. No adenopathy. No pneumothorax. No  bone lesions. IMPRESSION: Lungs clear.  Cardiac silhouette normal. Electronically Signed   By: Lowella Grip III M.D.   On: 10/30/2019 14:12    ____________________________________________    PROCEDURES  Procedure(s) performed:    Procedures    Medications  ketorolac (TORADOL) 30 MG/ML injection 30 mg (30 mg Intramuscular Given 10/30/19 1603)  promethazine (PHENERGAN) injection 25 mg (25 mg Intramuscular Given 10/30/19 1602)  diphenhydrAMINE (BENADRYL) injection 50 mg (50 mg Intramuscular Given 10/30/19 1603)     ____________________________________________   INITIAL IMPRESSION / ASSESSMENT AND PLAN / ED COURSE  Pertinent labs & imaging results that were  available during my care of the patient were reviewed by me and considered in my medical decision making (see chart for details).  Review of the [place] CSRS was performed in accordance of the [place] prior to dispensing any controlled drugs.           Patient's diagnosis is consistent with community-acquired pneumonia.  Patient presented to the emergency department complaining of worsening cough, body aches, emesis.  Patient states that she was diagnosed earlier this month with a viral respiratory infection.  This is confirmed in epic as patient saw her primary care on the fifth of this month.  She had a negative Covid test at that time, and was placed on a steroid as she is a asthmatic.  Patient states that she took her steroid as prescribed, seem to be improving and then worsened 2 days ago.  Patient is also experiencing a migraine headache.  She states that she has a history of migraines, takes Maxalt typically for her headaches.  She is out of her prescription and primary care has not refilled same.  Patient is requesting medication for her migraine.  Patient will be given migraine cocktail here in the emergency department.  I will send the patient home with a refill of her Maxalt.  Overall exam was reassuring.  Given the  timeframe of viral illness, improvement, then worsening I suspect a component of community-acquired pneumonia.  Chest x-ray did not reveal any consolidations.  However given the timeframe I will treat the patient with Z-Pak.  Patient will be given symptom control medications at home for nausea for her posttussive emesis.  I discussed fluids here in the emergency department, however patient preferred to wait to talk with her mother about receiving IV fluids.  I followed up with her mother and the patient and they felt like she could hydrate at home with oral fluids.  Therefore no IV fluids at this time.  Follow-up with primary care as needed.  Return cautions discussed with the patient at this time.  Patient is given ED precautions to return to the ED for any worsening or new symptoms.     ____________________________________________  FINAL CLINICAL IMPRESSION(S) / ED DIAGNOSES  Final diagnoses:  Community acquired pneumonia, unspecified laterality      NEW MEDICATIONS STARTED DURING THIS VISIT:  ED Discharge Orders         Ordered    predniSONE (DELTASONE) 50 MG tablet  Daily with breakfast        10/30/19 1615    ondansetron (ZOFRAN-ODT) 4 MG disintegrating tablet  Every 8 hours PRN        10/30/19 1615    brompheniramine-pseudoephedrine-DM 30-2-10 MG/5ML syrup  4 times daily PRN        10/30/19 1615    rizatriptan (MAXALT-MLT) 10 MG disintegrating tablet  As needed        10/30/19 1615              This chart was dictated using voice recognition software/Dragon. Despite best efforts to proofread, errors can occur which can change the meaning. Any change was purely unintentional.    Darletta Moll, PA-C 10/30/19 1620    Lavonia Drafts, MD 10/30/19 1191    Lavonia Drafts, MD 10/30/19 [phone]

## 2019-10-30 NOTE — ED Triage Notes (Signed)
Pt reports Monday started with upper resp symtpoms, productive cough, bodyaches and feeling like she was going to pass out. Pt states had a COVID test at school that was negative Monday.

## 2019-12-26 ENCOUNTER — Ambulatory Visit: Payer: BC Managed Care – PPO | Admitting: Pulmonary Disease

## 2019-12-31 ENCOUNTER — Encounter: Payer: Self-pay | Admitting: Certified Nurse Midwife

## 2019-12-31 ENCOUNTER — Other Ambulatory Visit (HOSPITAL_COMMUNITY)
Admission: RE | Admit: 2019-12-31 | Discharge: 2019-12-31 | Disposition: A | Discharge status: Home / Self Care | Payer: BC Managed Care – PPO | Source: Ambulatory Visit | Attending: Certified Nurse Midwife | Admitting: Certified Nurse Midwife

## 2019-12-31 ENCOUNTER — Other Ambulatory Visit: Payer: Self-pay

## 2019-12-31 ENCOUNTER — Ambulatory Visit (INDEPENDENT_AMBULATORY_CARE_PROVIDER_SITE_OTHER): Payer: BC Managed Care – PPO | Admitting: Certified Nurse Midwife

## 2019-12-31 VITALS — BP 114/76 | HR 94 | Ht 63.0 in | Wt 160.2 lb

## 2019-12-31 DIAGNOSIS — Z1159 Encounter for screening for other viral diseases: Secondary | ICD-10-CM

## 2019-12-31 DIAGNOSIS — Z01419 Encounter for gynecological examination (general) (routine) without abnormal findings: Secondary | ICD-10-CM

## 2019-12-31 DIAGNOSIS — Z113 Encounter for screening for infections with a predominantly sexual mode of transmission: Secondary | ICD-10-CM | POA: Insufficient documentation

## 2019-12-31 DIAGNOSIS — Z800 Family history of malignant neoplasm of digestive organs: Secondary | ICD-10-CM

## 2019-12-31 DIAGNOSIS — Z124 Encounter for screening for malignant neoplasm of cervix: Secondary | ICD-10-CM

## 2019-12-31 DIAGNOSIS — R194 Change in bowel habit: Secondary | ICD-10-CM

## 2019-12-31 DIAGNOSIS — Z23 Encounter for immunization: Secondary | ICD-10-CM | POA: Diagnosis not present

## 2019-12-31 DIAGNOSIS — R195 Other fecal abnormalities: Secondary | ICD-10-CM

## 2019-12-31 DIAGNOSIS — Z114 Encounter for screening for human immunodeficiency virus [HIV]: Secondary | ICD-10-CM

## 2019-12-31 MED ORDER — NORETHIN ACE-ETH ESTRAD-FE 1-20 MG-MCG PO TABS
1.0000 | ORAL_TABLET | Freq: Every day | ORAL | 11 refills | Status: DC
Start: 2019-12-31 — End: 2020-01-15

## 2019-12-31 MED ORDER — VITAMIN D (ERGOCALCIFEROL) 1.25 MG (50000 UNIT) PO CAPS
50000.0000 [IU] | ORAL_CAPSULE | ORAL | 1 refills | Status: DC
Start: 2019-12-31 — End: 2020-08-10

## 2019-12-31 MED ORDER — VALACYCLOVIR HCL 500 MG PO TABS: 500.0000 mg | ORAL_TABLET | Freq: Two times a day (BID) | ORAL | 2 refills | Status: AC

## 2019-12-31 NOTE — Patient Instructions (Signed)
Preventive Care 21-22 Years Old, Female Preventive care refers to visits with your health care provider and lifestyle choices that can promote health and wellness. This includes:  A yearly physical exam. This may also be called an annual well check.  Regular dental visits and eye exams.  Immunizations.  Screening for certain conditions.  Healthy lifestyle choices, such as eating a healthy diet, getting regular exercise, not using drugs or products that contain nicotine and tobacco, and limiting alcohol use. What can I expect for my preventive care visit? Physical exam Your health care provider will check your:  Height and weight. This may be used to calculate body mass index (BMI), which tells if you are at a healthy weight.  Heart rate and blood pressure.  Skin for abnormal spots. Counseling Your health care provider may ask you questions about your:  Alcohol, tobacco, and drug use.  Emotional well-being.  Home and relationship well-being.  Sexual activity.  Eating habits.  Work and work environment.  Method of birth control.  Menstrual cycle.  Pregnancy history. What immunizations do I need?  Influenza (flu) vaccine  This is recommended every year. Tetanus, diphtheria, and pertussis (Tdap) vaccine  You may need a Td booster every 10 years. Varicella (chickenpox) vaccine  You may need this if you have not been vaccinated. Human papillomavirus (HPV) vaccine  If recommended by your health care provider, you may need three doses over 6 months. Measles, mumps, and rubella (MMR) vaccine  You may need at least one dose of MMR. You may also need a second dose. Meningococcal conjugate (MenACWY) vaccine  One dose is recommended if you are age 19-21 years and a first-year college student living in a residence hall, or if you have one of several medical conditions. You may also need additional booster doses. Pneumococcal conjugate (PCV13) vaccine  You may need  this if you have certain conditions and were not previously vaccinated. Pneumococcal polysaccharide (PPSV23) vaccine  You may need one or two doses if you smoke cigarettes or if you have certain conditions. Hepatitis A vaccine  You may need this if you have certain conditions or if you travel or work in places where you may be exposed to hepatitis A. Hepatitis B vaccine  You may need this if you have certain conditions or if you travel or work in places where you may be exposed to hepatitis B. Haemophilus influenzae type b (Hib) vaccine  You may need this if you have certain conditions. You may receive vaccines as individual doses or as more than one vaccine together in one shot (combination vaccines). Talk with your health care provider about the risks and benefits of combination vaccines. What tests do I need?  Blood tests  Lipid and cholesterol levels. These may be checked every 5 years starting at age 20.  Hepatitis C test.  Hepatitis B test. Screening  Diabetes screening. This is done by checking your blood sugar (glucose) after you have not eaten for a while (fasting).  Sexually transmitted disease (STD) testing.  BRCA-related cancer screening. This may be done if you have a family history of breast, ovarian, tubal, or peritoneal cancers.  Pelvic exam and Pap test. This may be done every 3 years starting at age 21. Starting at age 30, this may be done every 5 years if you have a Pap test in combination with an HPV test. Talk with your health care provider about your test results, treatment options, and if necessary, the need for more tests.   Follow these instructions at home: Eating and drinking   Eat a diet that includes fresh fruits and vegetables, whole grains, lean protein, and low-fat dairy.  Take vitamin and mineral supplements as recommended by your health care provider.  Do not drink alcohol if: ? Your health care provider tells you not to drink. ? You are  pregnant, may be pregnant, or are planning to become pregnant.  If you drink alcohol: ? Limit how much you have to 0-1 drink a day. ? Be aware of how much alcohol is in your drink. In the [place] one drink equals one 12 oz bottle of beer (355 mL), one 5 oz glass of wine (148 mL), or one 1 oz glass of hard liquor (44 mL). Lifestyle  Take daily care of your teeth and gums.  Stay active. Exercise for at least 30 minutes on 5 or more days each week.  Do not use any products that contain nicotine or tobacco, such as cigarettes, e-cigarettes, and chewing tobacco. If you need help quitting, ask your health care provider.  If you are sexually active, practice safe sex. Use a condom or other form of birth control (contraception) in order to prevent pregnancy and STIs (sexually transmitted infections). If you plan to become pregnant, see your health care provider for a preconception visit. What's next?  Visit your health care provider once a year for a well check visit.  Ask your health care provider how often you should have your eyes and teeth checked.  Stay up to date on all vaccines. This information is not intended to replace advice given to you by your health care provider. Make sure you discuss any questions you have with your health care provider. Document Revised: 09/07/2017 Document Reviewed: 09/07/2017 Elsevier Patient Education  2020 [employer].

## 2019-12-31 NOTE — Progress Notes (Signed)
GYNECOLOGY ANNUAL PREVENTATIVE CARE ENCOUNTER NOTE  History:     Angela Rocha is a 24 y.o. G0P0000 female here for a routine annual gynecologic exam.  Current complaints: Frequent lose stools with stress.   Denies abnormal vaginal bleeding, discharge, pelvic pain, problems with intercourse or other gynecologic concerns.     Social Relationship: Friend with benefits Living:At school, at home on break  Work: at school , apartment complex. At [facility] [employer] [facility] state wants to be Ped PA Exercise: 3-4 x wk 30-45 min Smoke/Alcohol/drug use: occ alcohol.   Gynecologic History No LMP recorded. Contraception: OCP (estrogen/progesterone), considering IUD Last Pap: has not had.  Last mammogram: n/a    Upstream - 12/31/19 1015      Pregnancy Intention Screening   Does the patient want to become pregnant in the next year? No    Does the patient's partner want to become pregnant in the next year? No    Would the patient like to discuss contraceptive options today? No      Contraception Wrap Up   Current Method Oral Contraceptive    Contraception Counseling Provided Yes          The pregnancy intention screening data noted above was reviewed. Potential methods of contraception were discussed. The patient elected to proceed with Oral Contraceptive . Reviewed options, considering IUD   Obstetric History OB History  Gravida Para Term Preterm AB Living  0 0 0 0 0 0  SAB IAB Ectopic Multiple Live Births  0 0 0 0 0    Past Medical History:  Diagnosis Date  . ADHD   . Anxiety   . Asthma   . Migraine   . Pelvic congestion syndrome     Past Surgical History:  Procedure Laterality Date  . HIP SURGERY    . TONSILLECTOMY      Current Outpatient Medications on File Prior to Visit  Medication Sig Dispense Refill  . acetaminophen (TYLENOL) 325 MG tablet Take 2 tablets (650 mg total) by mouth every 6 (six) hours as needed for mild pain (or Fever >/= 101).    Marland Kitchen albuterol (PROVENTIL HFA;VENTOLIN  HFA) 108 (90 Base) MCG/ACT inhaler Inhale 2 puffs into the lungs every 6 (six) hours as needed for wheezing or shortness of breath. 1 Inhaler 4  . albuterol (PROVENTIL) (2.5 MG/3ML) 0.083% nebulizer solution Take 3 mLs (2.5 mg total) by nebulization every 6 (six) hours as needed for wheezing or shortness of breath. 75 mL 12  . amphetamine-dextroamphetamine (ADDERALL) 15 MG tablet Take 15 mg by mouth daily.    . brompheniramine-pseudoephedrine-DM 30-2-10 MG/5ML syrup Take 10 mLs by mouth 4 (four) times daily as needed. 200 mL 0  . budesonide-formoterol (SYMBICORT) 160-4.5 MCG/ACT inhaler Inhale 2 puffs into the lungs 2 (two) times daily. 1 Inhaler 5  . cetirizine (ZYRTEC) 10 MG tablet Take 10 mg by mouth daily.  3  . cyanocobalamin (,VITAMIN B-12,) 1000 MCG/ML injection INJECT 1 ML (1,000 MCG TOTAL) INTO THE MUSCLE EVERY 30 (THIRTY) DAYS. 10 mL 1  . dexmethylphenidate (FOCALIN) 2.5 MG tablet Take 2.5 mg by mouth daily.    Marland Kitchen FLUoxetine (PROZAC) 20 MG capsule Take 20 mg by mouth daily.   0  . ibuprofen (ADVIL,MOTRIN) 600 MG tablet Take 1 tablet (600 mg total) by mouth every 6 (six) hours as needed for moderate pain. 30 tablet 0  . JUNEL FE 1/20 1-20 MG-MCG tablet TAKE 1 TABLET BY MOUTH EVERY DAY 28 tablet 6  . nortriptyline (  PAMELOR) 10 MG capsule Take 20 mg by mouth at bedtime.  3  . ondansetron (ZOFRAN-ODT) 4 MG disintegrating tablet Take 1 tablet (4 mg total) by mouth every 8 (eight) hours as needed for nausea or vomiting. 20 tablet 0  . polyethylene glycol (MIRALAX / GLYCOLAX) packet Take 17 g by mouth daily as needed. 14 each 0  . predniSONE (DELTASONE) 50 MG tablet Take 1 tablet (50 mg total) by mouth daily with breakfast. 5 tablet 0  . Prenatal-DSS-FeCb-FeGl-FA (CITRANATAL BLOOM) 90-1 MG TABS Take 1 tablet by mouth daily. 30 tablet 11  . rizatriptan (MAXALT-MLT) 10 MG disintegrating tablet Take 10 mg by mouth as directed. TAKE 1 TAB AT HEADACHE ONSET MAY TAKE A 2ND DOSE AFTER 2 HOURS IF NEEDED NO  MORE THAN 2 DOSES/24 HRS  3  . rizatriptan (MAXALT-MLT) 10 MG disintegrating tablet Take 1 tablet (10 mg total) by mouth as needed for migraine. Take 1 tablet to start a headache.  May repeat in 2 hours if needed.  Do not take more than 2 doses per 24 hours 30 tablet 1  . tranexamic acid (LYSTEDA) 650 MG TABS tablet Take 2 tablets (1,300 mg total) by mouth 3 (three) times daily. Take during menses for a maximum of five days 30 tablet 2  . Vitamin D, Ergocalciferol, (DRISDOL) 1.25 MG (50000 UT) CAPS capsule Take 1 capsule (50,000 Units total) by mouth every 7 (seven) days. 30 capsule 1   No current facility-administered medications on file prior to visit.    Allergies  Allergen Reactions  . Amoxicillin-Pot Clavulanate Rash and Other (See Comments)    Has patient had a PCN reaction causing immediate rash, facial/tongue/throat swelling, SOB or lightheadedness with hypotension: Yes Has patient had a PCN reaction causing severe rash involving mucus membranes or skin necrosis: No Has patient had a PCN reaction that required hospitalization: No Has patient had a PCN reaction occurring within the last 10 years: No If all of the above answers are "NO", then may proceed with Cephalosporin use.     Social History:  reports that she has never smoked. She has never used smokeless tobacco. She reports that she does not drink alcohol and does not use drugs.  Family History  Problem Relation Age of Onset  . Diabetes Mellitus II Father     The following portions of the patient's history were reviewed and updated as appropriate: allergies, current medications, past family history, past medical history, past social history, past surgical history and problem list.  Review of Systems Pertinent items noted in HPI and remainder of comprehensive ROS otherwise negative.  Physical Exam:  There were no vitals taken for this visit. CONSTITUTIONAL: Well-developed, well-nourished female in no acute distress.   HENT:  Normocephalic, atraumatic, External right and left ear normal. Oropharynx is clear and moist EYES: Conjunctivae and EOM are normal. Pupils are equal, round, and reactive to light. No scleral icterus.  NECK: Normal range of motion, supple, no masses.  Normal thyroid.  SKIN: Skin is warm and dry. No rash noted. Not diaphoretic. No erythema. No pallor. MUSCULOSKELETAL: Normal range of motion. No tenderness.  No cyanosis, clubbing, or edema.  2+ distal pulses. NEUROLOGIC: Alert and oriented to person, place, and time. Normal reflexes, muscle tone coordination.  PSYCHIATRIC: Normal mood and affect. Normal behavior. Normal judgment and thought content. CARDIOVASCULAR: Normal heart rate noted, regular rhythm RESPIRATORY: Clear to auscultation bilaterally. Effort and breath sounds normal, no problems with respiration noted. BREASTS: Symmetric in size. No masses,  tenderness, skin changes, nipple drainage, or lymphadenopathy bilaterally.  ABDOMEN: Soft, no distention noted.  No tenderness, rebound or guarding.  PELVIC: Normal appearing external genitalia and urethral meatus; normal appearing vaginal mucosa and cervix.  No abnormal discharge noted.  Pap smear obtained. Contact bleeding present  Normal uterine size, no other palpable masses, no uterine or adnexal tenderness.  .   Assessment and Plan:    1. Women's annual routine gynecological examination   Pap:Will follow up results of pap smear and manage accordingly. Mammogram : n/a  Labs: Hep C, HIV,  Refills:Vit D, Valtrex, OCP Referral: GI  Routine preventative health maintenance measures emphasized. Please refer to After Visit Summary for other counseling recommendations.      Philip Aspen, CNM [facility]

## 2020-01-01 LAB — HEPATITIS C ANTIBODY: Hep C Virus Ab: <0.1 s/co ratio (ref 0.0–0.9)

## 2020-01-01 LAB — HIV ANTIBODY (ROUTINE TESTING W REFLEX): HIV Screen 4th Generation wRfx: NONREACTIVE

## 2020-01-07 LAB — CYTOLOGY - PAP
Chlamydia: NEGATIVE
Comment: NEGATIVE
Comment: NEGATIVE
Comment: NORMAL
Diagnosis: NEGATIVE
Neisseria Gonorrhea: NEGATIVE
Trichomonas: NEGATIVE

## 2020-01-08 ENCOUNTER — Ambulatory Visit: Payer: BC Managed Care – PPO | Admitting: Pulmonary Disease

## 2020-01-08 ENCOUNTER — Encounter: Payer: Self-pay | Admitting: Pulmonary Disease

## 2020-01-08 ENCOUNTER — Other Ambulatory Visit: Payer: Self-pay

## 2020-01-08 VITALS — BP 116/72 | HR 85 | Temp 98.4°F | Ht 64.0 in | Wt 162.6 lb

## 2020-01-08 DIAGNOSIS — J3089 Other allergic rhinitis: Secondary | ICD-10-CM | POA: Insufficient documentation

## 2020-01-08 DIAGNOSIS — J45909 Unspecified asthma, uncomplicated: Secondary | ICD-10-CM

## 2020-01-08 DIAGNOSIS — J309 Allergic rhinitis, unspecified: Secondary | ICD-10-CM

## 2020-01-08 LAB — CBC WITH DIFFERENTIAL/PLATELET
Basophils Absolute: 0 10*3/uL (ref 0.0–0.1)
Basophils Relative: 0.8 % (ref 0.0–3.0)
Eosinophils Absolute: 0.1 10*3/uL (ref 0.0–0.7)
Eosinophils Relative: 1.9 % (ref 0.0–5.0)
HCT: 35.8 % — ABNORMAL LOW (ref 36.0–46.0)
Hemoglobin: 12 g/dL (ref 12.0–15.0)
Lymphocytes Relative: 37.1 % (ref 12.0–46.0)
Lymphs Abs: 2.2 10*3/uL (ref 0.7–4.0)
MCHC: 33.7 g/dL (ref 30.0–36.0)
MCV: 84.6 fl (ref 78.0–100.0)
Monocytes Absolute: 0.4 10*3/uL (ref 0.1–1.0)
Monocytes Relative: 7 % (ref 3.0–12.0)
Neutro Abs: 3.1 10*3/uL (ref 1.4–7.7)
Neutrophils Relative %: 53.2 % (ref 43.0–77.0)
Platelets: 215 10*3/uL (ref 150.0–400.0)
RBC: 4.23 Mil/uL (ref 3.87–5.11)
RDW: 14.9 % (ref 11.5–15.5)
WBC: 5.8 10*3/uL (ref 4.0–10.5)

## 2020-01-08 MED ORDER — BUDESONIDE-FORMOTEROL FUMARATE 160-4.5 MCG/ACT IN AERO: 2.0000 | INHALATION_SPRAY | Freq: Two times a day (BID) | RESPIRATORY_TRACT | 5 refills | Status: DC

## 2020-01-08 MED ORDER — ALBUTEROL SULFATE HFA 108 (90 BASE) MCG/ACT IN AERS: 2.0000 | INHALATION_SPRAY | Freq: Four times a day (QID) | RESPIRATORY_TRACT | 4 refills | Status: AC | PRN

## 2020-01-08 NOTE — Progress Notes (Signed)
@Patient  ID: Angela Rocha, female    DOB: 04/21/1995, 24 y.o.   MRN: [id number]  Chief Complaint  Patient presents with  . Follow-up    Asthma, doing well    Referring provider: Wayland Denis, PA-C  HPI:  24 year old female followed in our office for asthma  PMH: Sinusitis, history of bronchitis Smoker/ Smoking History: Never smoker Maintenance: Symbicort 160 Pt of: Dr. Lamonte Sakai  01/08/2020  - Visit   24 year old female followed in our office for asthma.  Last seen a year ago.  Patient presenting today requesting refills of Symbicort 160 as well as her rescue inhaler.  She reports that she was diagnosed with pneumonia in October/2021.  Also patient is prone to sinus infections.  Last blood work in chart does not show elevated eosinophil count.  Patient is requesting allergy eval today.  Patient's last steroids were in October/2021.  Last antibiotics were also at that time.  She reports that x-rays have been repeated by primary care as well as school student health (patient goes to [facility]).  Showing clearing of pneumonia.  Patient continues to have ongoing postnasal drip and congestion.  She is adherent to a daily antihistamine.  She reports that she has to take Benadryl most nights to help with symptoms.  She is not utilizing nasal saline rinses.  Questionaires / Pulmonary Flowsheets:   ACT:  Asthma Control Test ACT Total Score  01/08/2020 22   Tests:   FENO:  No results found for: NITRICOXIDE  PFT: PFT Results Latest Ref Rng & Units 02/22/2016  FVC-Pre L 3.09  FVC-Predicted Pre % 82  FVC-Post L 3.13  FVC-Predicted Post % 83  Pre FEV1/FVC % % 84  Post FEV1/FCV % % 95  FEV1-Pre L 2.60  FEV1-Predicted Pre % 77  FEV1-Post L 2.97    WALK:  No flowsheet data found.  Imaging: No results found.  Lab Results:  CBC    Component Value Date/Time   WBC 4.6 11/17/2017 1434   WBC 10.0 05/18/2017 0439   RBC 4.27 11/17/2017 1434   RBC 3.25 (L) 05/18/2017 0439    HGB 10.9 (L) 11/17/2017 1434   HCT 34.3 11/17/2017 1434   PLT 238 11/17/2017 1434   MCV 80 11/17/2017 1434   MCH 25.5 (L) 11/17/2017 1434   MCH 30.2 05/18/2017 0439   MCHC 31.8 11/17/2017 1434   MCHC 34.0 05/18/2017 0439   RDW 14.9 11/17/2017 1434   LYMPHSABS 1.6 11/17/2017 1434   EOSABS 0.0 11/17/2017 1434   BASOSABS 0.0 11/17/2017 1434    BMET    Component Value Date/Time   NA 138 05/18/2017 0439   K 3.4 (L) 05/18/2017 0439   CL 110 05/18/2017 0439   CO2 19 (L) 05/18/2017 0439   GLUCOSE 107 (H) 05/18/2017 0439   BUN 8 05/18/2017 0439   CREATININE 0.77 05/18/2017 0439   CALCIUM 7.8 (L) 05/18/2017 0439   GFRNONAA >60 05/18/2017 0439   GFRAA >60 05/18/2017 0439    BNP No results found for: BNP  ProBNP No results found for: PROBNP  Specialty Problems      Pulmonary Problems   Intrinsic asthma   Allergic rhinitis      Allergies  Allergen Reactions  . Doxycycline Nausea And Vomiting    nausea  . Amoxicillin-Pot Clavulanate Rash and Other (See Comments)    Has patient had a PCN reaction causing immediate rash, facial/tongue/throat swelling, SOB or lightheadedness with hypotension: Yes Has patient had a PCN reaction causing severe  rash involving mucus membranes or skin necrosis: No Has patient had a PCN reaction that required hospitalization: No Has patient had a PCN reaction occurring within the last 10 years: No If all of the above answers are "NO", then may proceed with Cephalosporin use.     Immunization History  Administered Date(s) Administered  . Influenza,inj,Quad PF,6+ Mos 10/26/2017  . Influenza-Unspecified 12/30/2016  . Meningococcal Conjugate 08/15/2016    Past Medical History:  Diagnosis Date  . ADHD   . Anxiety   . Asthma   . Migraine   . Pelvic congestion syndrome     Tobacco History: Social History   Tobacco Use  Smoking Status Never Smoker  Smokeless Tobacco Never Used   Counseling given: Not Answered   Continue to not  smoke  Outpatient Encounter Medications as of 01/08/2020  Medication Sig  . amphetamine-dextroamphetamine (ADDERALL) 15 MG tablet Take 15 mg by mouth daily.  . busPIRone (BUSPAR) 7.5 MG tablet Take 7.5 mg by mouth 2 (two) times daily.  . cetirizine (ZYRTEC) 10 MG tablet Take 10 mg by mouth daily.  . cyanocobalamin (,VITAMIN B-12,) 1000 MCG/ML injection INJECT 1 ML (1,000 MCG TOTAL) INTO THE MUSCLE EVERY 30 (THIRTY) DAYS.  Marland Kitchen dexmethylphenidate (FOCALIN) 2.5 MG tablet Take 2.5 mg by mouth daily.  . hydrOXYzine (ATARAX/VISTARIL) 25 MG tablet Take 25 mg by mouth daily.  Marland Kitchen ketoconazole (NIZORAL) 2 % cream Apply topically.  . norethindrone-ethinyl estradiol (JUNEL FE 1/20) 1-20 MG-MCG tablet Take 1 tablet by mouth daily.  . ondansetron (ZOFRAN-ODT) 4 MG disintegrating tablet Take 1 tablet (4 mg total) by mouth every 8 (eight) hours as needed for nausea or vomiting.  . rizatriptan (MAXALT-MLT) 10 MG disintegrating tablet Take 10 mg by mouth as directed. TAKE 1 TAB AT HEADACHE ONSET MAY TAKE A 2ND DOSE AFTER 2 HOURS IF NEEDED NO MORE THAN 2 DOSES/24 HRS  . venlafaxine XR (EFFEXOR-XR) 37.5 MG 24 hr capsule Take 37.5 mg by mouth daily.  . Vitamin D, Ergocalciferol, (DRISDOL) 1.25 MG (50000 UNIT) CAPS capsule Take 1 capsule (50,000 Units total) by mouth every 7 (seven) days.  . [DISCONTINUED] albuterol (PROVENTIL HFA;VENTOLIN HFA) 108 (90 Base) MCG/ACT inhaler Inhale 2 puffs into the lungs every 6 (six) hours as needed for wheezing or shortness of breath.  . [DISCONTINUED] budesonide-formoterol (SYMBICORT) 160-4.5 MCG/ACT inhaler Inhale 2 puffs into the lungs 2 (two) times daily.  Marland Kitchen albuterol (VENTOLIN HFA) 108 (90 Base) MCG/ACT inhaler Inhale 2 puffs into the lungs every 6 (six) hours as needed for wheezing or shortness of breath.  . budesonide-formoterol (SYMBICORT) 160-4.5 MCG/ACT inhaler Inhale 2 puffs into the lungs 2 (two) times daily.   No facility-administered encounter medications on file as of  01/08/2020.     Review of Systems  Review of Systems  Constitutional: Negative for activity change, fatigue and fever.  HENT: Positive for congestion, postnasal drip and rhinorrhea. Negative for sinus pressure, sinus pain and sore throat.   Respiratory: Negative for cough, shortness of breath and wheezing.   Cardiovascular: Negative for chest pain and palpitations.  Gastrointestinal: Negative for diarrhea, nausea and vomiting.  Musculoskeletal: Negative for arthralgias.  Neurological: Negative for dizziness.  Psychiatric/Behavioral: Negative for sleep disturbance. The patient is not nervous/anxious.      Physical Exam  BP 116/72 (BP Location: Left Arm, Cuff Size: Normal)   Pulse 85   Temp 98.4 F (36.9 C) (Oral)   Ht 5\' 4"  (1.626 m)   Wt 162 lb 9.6 oz (73.8 kg)  LMP 12/27/2019 (Exact Date)   SpO2 99%   BMI 27.91 kg/m   Wt Readings from Last 5 Encounters:  01/08/20 162 lb 9.6 oz (73.8 kg)  12/31/19 160 lb 4 oz (72.7 kg)  10/30/19 160 lb (72.6 kg)  08/14/18 149 lb 6.4 oz (67.8 kg)  11/17/17 151 lb 1.6 oz (68.5 kg)    BMI Readings from Last 5 Encounters:  01/08/20 27.91 kg/m  12/31/19 28.39 kg/m  10/30/19 28.34 kg/m  08/14/18 25.64 kg/m  11/17/17 25.94 kg/m     Physical Exam Vitals and nursing note reviewed.  Constitutional:      General: She is not in acute distress.    Appearance: Normal appearance.  HENT:     Head: Normocephalic and atraumatic.     Right Ear: External ear normal.     Left Ear: External ear normal.     Nose: Rhinorrhea present. No congestion.     Mouth/Throat:     Mouth: Mucous membranes are moist.     Pharynx: Oropharynx is clear.     Comments: PND Eyes:     Pupils: Pupils are equal, round, and reactive to light.  Cardiovascular:     Rate and Rhythm: Normal rate and regular rhythm.     Pulses: Normal pulses.     Heart sounds: Normal heart sounds. No murmur heard.   Pulmonary:     Effort: Pulmonary effort is normal. No  respiratory distress.     Breath sounds: Normal breath sounds. No decreased air movement. No decreased breath sounds, wheezing or rales.  Musculoskeletal:     Cervical back: Normal range of motion.  Skin:    General: Skin is warm and dry.     Capillary Refill: Capillary refill takes less than 2 seconds.  Neurological:     General: No focal deficit present.     Mental Status: She is alert and oriented to person, place, and time. Mental status is at baseline.     Gait: Gait normal.  Psychiatric:        Mood and Affect: Mood normal.        Behavior: Behavior normal.        Thought Content: Thought content normal.        Judgment: Judgment normal.       Assessment & Plan:   Allergic rhinitis Plan: Continue Zyrtec Start chlor tabs at night Start nasal saline rinses Continue Flonase Referral to allergist  Intrinsic asthma Plan: Continue Symbicort 160 Referral to allergist Refill rescue inhaler Continue Zyrtec Can use chlor tabs at night Start nasal saline rinses    Return in about 1 year (around 01/07/2021), or if symptoms worsen or fail to improve, for Follow up with Dr. Delton Coombes.   Coral Ceo, NP 01/08/2020   This appointment required 32 minutes of patient care (this includes precharting, chart review, review of results, face-to-face care, etc.).

## 2020-01-08 NOTE — Patient Instructions (Addendum)
You were seen today by Coral Ceo, NP  for:   1. Intrinsic asthma  - Ambulatory referral to Allergy - CBC with Differential/Platelet; Future - IgE; Future  Continue Symbicort 160 >>> 2 puffs in the morning right when you wake up, rinse out your mouth after use, 12 hours later 2 puffs, rinse after use >>> Take this daily, no matter what >>> This is not a rescue inhaler   Only use your albuterol as a rescue medication to be used if you can't catch your breath by resting or doing a relaxed purse lip breathing pattern.  - The less you use it, the better it will work when you need it. - Ok to use up to 2 puffs  every 4 hours if you must but call for immediate appointment if use goes up over your usual need - Don't leave home without it !!  (think of it like the spare tire for your car)   Schedule follow-up with allergist based off of a referral  Dr. Lucie Leather or Dr. Dayle Points [address] [place], [place] [address] Office : [phone] Fax : [phone] [email or web address]   2. Allergic rhinitis, unspecified seasonality, unspecified trigger  Continue daily Zyrtec  Start nasal saline rinses twice daily Use distilled water Shake well Get bottle lukewarm like a baby bottle  Please start taking chlorpheniramine (aka Chlor tabs) 4 mg tablet (1 to 2 tablets at night) for management of allergies and postnasal drip at night >>> This is an over-the-counter medication >>> This medication is sedating  Can continue use Flonase after nasal saline rinses   We recommend today:  Orders Placed This Encounter  Procedures  . CBC with Differential/Platelet    Standing Status:   Future    Standing Expiration Date:   01/07/2021  . IgE    Standing Status:   Future    Standing Expiration Date:   01/07/2021  . Ambulatory referral to Allergy    Referral Priority:   Routine    Referral Type:   Allergy Testing    Referral Reason:   Specialty Services  Required    Requested Specialty:   Allergy    Number of Visits Requested:   1   Orders Placed This Encounter  Procedures  . CBC with Differential/Platelet  . IgE  . Ambulatory referral to Allergy   Meds ordered this encounter  Medications  . albuterol (VENTOLIN HFA) 108 (90 Base) MCG/ACT inhaler    Sig: Inhale 2 puffs into the lungs every 6 (six) hours as needed for wheezing or shortness of breath.    Dispense:  1 each    Refill:  4  . budesonide-formoterol (SYMBICORT) 160-4.5 MCG/ACT inhaler    Sig: Inhale 2 puffs into the lungs 2 (two) times daily.    Dispense:  1 each    Refill:  5    Follow Up:    Return in about 1 year (around 01/07/2021), or if symptoms worsen or fail to improve, for Follow up with Dr. Delton Coombes.   Notification of test results are managed in the following manner: If there are  any recommendations or changes to the  plan of care discussed in office today,  we will contact you and let you know what they are. If you do not hear from [place], then your results are normal and you can view them through your  MyChart account , or a letter will be sent to you. Thank you again for trusting [place] with your care  -  Thank you, [facility]    It is flu season:   >>> Best ways to protect herself from the flu: Receive the yearly flu vaccine, practice good hand hygiene washing with soap and also using hand sanitizer when available, eat a nutritious meals, get adequate rest, hydrate appropriately       Please contact the office if your symptoms worsen or you have concerns that you are not improving.   Thank you for choosing  [facility] for your healthcare, and for allowing [place] to partner with you on your healthcare journey. I am thankful to be able to provide care to you today.   Wyn Quaker FNP-C

## 2020-01-08 NOTE — Assessment & Plan Note (Signed)
Plan: Continue Zyrtec Start chlor tabs at night Start nasal saline rinses Continue Flonase Referral to allergist

## 2020-01-08 NOTE — Assessment & Plan Note (Signed)
Plan: Continue Symbicort 160 Referral to allergist Refill rescue inhaler Continue Zyrtec Can use chlor tabs at night Start nasal saline rinses

## 2020-01-09 LAB — IGE: IgE (Immunoglobulin E), Serum: 28 kU/L (ref <=114)

## 2020-01-13 ENCOUNTER — Encounter: Payer: Self-pay | Admitting: *Deleted

## 2020-01-15 ENCOUNTER — Encounter: Payer: Self-pay | Admitting: Certified Nurse Midwife

## 2020-01-15 ENCOUNTER — Other Ambulatory Visit: Payer: Self-pay

## 2020-01-15 ENCOUNTER — Ambulatory Visit (INDEPENDENT_AMBULATORY_CARE_PROVIDER_SITE_OTHER): Payer: BC Managed Care – PPO | Admitting: Certified Nurse Midwife

## 2020-01-15 VITALS — BP 113/86 | HR 124 | Ht 64.0 in | Wt 159.4 lb

## 2020-01-15 DIAGNOSIS — Z30430 Encounter for insertion of intrauterine contraceptive device: Secondary | ICD-10-CM

## 2020-01-15 LAB — POCT URINE PREGNANCY: Preg Test, Ur: NEGATIVE

## 2020-01-15 MED ORDER — TETANUS-DIPHTH-ACELL PERTUSSIS 5-2.5-18.5 LF-MCG/0.5 IM SUSY
0.5000 mL | PREFILLED_SYRINGE | Freq: Once | INTRAMUSCULAR | Status: AC
  Administered 2019-12-31: 0.5 mL via INTRAMUSCULAR

## 2020-01-15 NOTE — Addendum Note (Signed)
Addended by: Brooke Dare on: 01/15/2020 03:15 PM   Modules accepted: Orders

## 2020-01-15 NOTE — Progress Notes (Signed)
   GYNECOLOGY OFFICE PROCEDURE NOTE  Angela Rocha is a 25 y.o. G0P0000 here for mirena  IUD insertion. No GYN concerns.  Last pap smear was on 12/31/19 and was normal.  IUD Insertion Procedure Note Patient identified, informed consent performed, consent signed.   Discussed risks of irregular bleeding, cramping, infection, malpositioning or misplacement of the IUD outside the uterus which may require further procedure such as laparoscopy. Also discussed >99% contraception efficacy, increased risk of ectopic pregnancy with failure of method.   Emphasized that this did not protect against STIs, condoms recommended during all sexual encounters. Time out was performed.  Urine pregnancy test negative.  Speculum placed in the vagina.  Cervix visualized.  Cleaned with Betadine x 2.  Grasped anteriorly with a single tooth tenaculum.  Uterus sounded to 7 cm.  The mirena  IUD placed per manufacturer's recommendations.  Strings trimmed to 3 cm. Tenaculum was removed, good hemostasis noted.  Patient tolerated procedure well.   Patient was given post-procedure instructions.  She was advised to have backup contraception for one week.  Patient was also asked to check IUD strings periodically and follow up in 4 weeks for IUD check.   Doreene Burke, CNM

## 2020-01-15 NOTE — Patient Instructions (Signed)

## 2020-01-16 DIAGNOSIS — R200 Anesthesia of skin: Secondary | ICD-10-CM | POA: Insufficient documentation

## 2020-01-20 ENCOUNTER — Ambulatory Visit: Payer: BC Managed Care – PPO | Admitting: Allergy

## 2020-02-05 ENCOUNTER — Ambulatory Visit (INDEPENDENT_AMBULATORY_CARE_PROVIDER_SITE_OTHER): Payer: Self-pay | Admitting: Certified Nurse Midwife

## 2020-02-05 ENCOUNTER — Encounter: Payer: Self-pay | Admitting: Certified Nurse Midwife

## 2020-02-05 ENCOUNTER — Other Ambulatory Visit: Payer: Self-pay

## 2020-02-05 VITALS — BP 104/75 | HR 80 | Ht 64.0 in | Wt 155.6 lb

## 2020-02-05 DIAGNOSIS — Z30431 Encounter for routine checking of intrauterine contraceptive device: Secondary | ICD-10-CM

## 2020-02-05 NOTE — Patient Instructions (Signed)

## 2020-02-05 NOTE — Progress Notes (Signed)
  GYNECOLOGY OFFICE ENCOUNTER NOTE  History:  25 y.o. G0P0000 here today for today for IUD string check; Mirena  IUD was placed  01/15/20 No complaints about the IUD, no concerning side effects.  The following portions of the patient's history were reviewed and updated as appropriate: allergies, current medications, past family history, past medical history, past social history, past surgical history and problem list. Last pap smear on 12/31/19 was normal.   Review of Systems:  Pertinent items are noted in HPI.   Objective:  Physical Exam Blood pressure 104/75, pulse 80, height 5\' 4"  (1.626 m), weight 155 lb 9 oz (70.6 kg), last menstrual period 01/21/2020. CONSTITUTIONAL: Well-developed, well-nourished female in no acute distress.  HENT:  Normocephalic, atraumatic. External right and left ear normal. Oropharynx is clear and moist EYES: Conjunctivae and EOM are normal. Pupils are equal, round, and reactive to light. No scleral icterus.  NECK: Normal range of motion, supple, no masses CARDIOVASCULAR: Normal heart rate noted RESPIRATORY: Effort and breath sounds normal, no problems with respiration noted ABDOMEN: Soft, no distention noted.   PELVIC: Normal appearing external genitalia; normal appearing vaginal mucosa and cervix.  IUD strings visualized, about 3 cm in length outside cervix.   Assessment & Plan:  Patient to keep IUD in place for up to seven years; can come in for removal if she desires pregnancy earlier or for any concerning side effects.   Philip Aspen, CNM

## 2020-02-06 ENCOUNTER — Other Ambulatory Visit: Payer: Self-pay

## 2020-02-06 ENCOUNTER — Other Ambulatory Visit: Payer: Self-pay | Admitting: Dermatology

## 2020-02-06 ENCOUNTER — Ambulatory Visit: Payer: BC Managed Care – PPO | Admitting: Dermatology

## 2020-02-06 DIAGNOSIS — Z1283 Encounter for screening for malignant neoplasm of skin: Secondary | ICD-10-CM

## 2020-02-06 DIAGNOSIS — B078 Other viral warts: Secondary | ICD-10-CM | POA: Diagnosis not present

## 2020-02-06 DIAGNOSIS — B351 Tinea unguium: Secondary | ICD-10-CM

## 2020-02-06 DIAGNOSIS — L814 Other melanin hyperpigmentation: Secondary | ICD-10-CM

## 2020-02-06 DIAGNOSIS — B353 Tinea pedis: Secondary | ICD-10-CM | POA: Diagnosis not present

## 2020-02-06 DIAGNOSIS — D225 Melanocytic nevi of trunk: Secondary | ICD-10-CM

## 2020-02-06 DIAGNOSIS — D485 Neoplasm of uncertain behavior of skin: Secondary | ICD-10-CM

## 2020-02-06 DIAGNOSIS — D229 Melanocytic nevi, unspecified: Secondary | ICD-10-CM

## 2020-02-06 MED ORDER — CICLOPIROX OLAMINE 0.77 % EX CREA: TOPICAL_CREAM | Freq: Two times a day (BID) | CUTANEOUS | 2 refills | Status: DC

## 2020-02-06 NOTE — Progress Notes (Signed)
Follow-Up Visit   Subjective  Angela Rocha is a 25 y.o. female who presents for the following: TBSE (Patient states she has lots of moles and would like to have all checked today if possible. She also states warts on right pinky finger. ).  Spot at right abdomen that patient thinks has changed. Patient here for full body skin exam and skin cancer screening.  She also reports nail changes on the left foot toenails present since she was a child and bothersome.    The following portions of the chart were reviewed this encounter and updated as appropriate:   Tobacco   Allergies   Meds   Problems   Med Hx   Surg Hx   Fam Hx       Review of Systems:  No other skin or systemic complaints except as noted in HPI or Assessment and Plan.  Objective  Well appearing patient in no apparent distress; mood and affect are within normal limits.  A full examination was performed including scalp, head, eyes, ears, nose, lips, neck, chest, axillae, abdomen, back, buttocks, bilateral upper extremities, bilateral lower extremities, hands, feet, fingers, toes, fingernails, and toenails. All findings within normal limits unless otherwise noted below.  Objective  Bilateral feet: Scaling and maceration web spaces and over distal and lateral soles.   Objective  toenails: multiple nails left foot thickened with subungual debris   Objective  Right Upper Back: 0.4cm dark brown papule, slight blue veil R/o Atypia     Objective  Right 5th finger nail fold and DIP (2): Verrucous papules   Assessment & Plan  Tinea pedis of right foot Bilateral feet  Start ciclopirox cream twice daily for 3-4 weeks then once weekly for prevention.   Ordered Medications: ciclopirox (LOPROX) 0.77 % cream  Onychomycosis toenails  Chronic condition with duration of several years. Condition is bothersome to patient. Currently flared.  If fungal culture + will order CBC, CMP and plan treatment with  terbinafine.  Discussed treatment with pulse dose PO terbinafine.   Terbinafine Counseling  Terbinafine is an anti-fungal medicine that can be applied to the skin (over the counter) or taken by mouth (prescription) to treat fungal infections. The pill version is often used to treat fungal infections of the nails or scalp. While most people do not have any side effects from taking terbinafine pills, some possible side effects of the medicine can include taste changes, headache, loss of smell, vision changes, nausea, vomiting, or diarrhea.   Rare side effects can include irritation of the liver, allergic reaction, or decrease in blood counts (which may show up as not feeling well or developing an infection). If you are concerned about any of these side effects, please stop the medicine and call your doctor, or in the case of an emergency such as feeling very unwell, seek immediate medical care.    Other Related Procedures Fungus Culture W/Rfx Rapid ID  Neoplasm of uncertain behavior of skin Right Upper Back  Epidermal / dermal shaving  Lesion diameter (cm):  0.4 Informed consent: discussed and consent obtained   Timeout: patient name, date of birth, surgical site, and procedure verified   Patient was prepped and draped in usual sterile fashion: area prepped with isopropyl alcohol. Anesthesia: the lesion was anesthetized in a standard fashion   Anesthetic:  1% lidocaine w/ epinephrine 1-100,000 buffered w/ 8.4% NaHCO3 Instrument used: flexible razor blade   Hemostasis achieved with: aluminum chloride   Outcome: patient tolerated procedure well  Post-procedure details: wound care instructions given   Additional details:  Mupirocin and a bandage applied  Specimen 1 - Surgical pathology Differential Diagnosis: r/o Atypia  Check Margins: No 0.4cm dark brown papule, slight blue veil   Other viral warts (2) Right 5th finger nail fold and DIP  Discussed viral etiology and risk of  spread.  Discussed multiple treatments may be required to clear warts.  Discussed possible post-treatment dyspigmentation and risk of recurrence.  Cantharidin is a blistering agent that comes from a beetle.  It needs to be washed off in about 4 hours after application.  Although it is painless when applied in office, it may cause symptoms of mild pain and burning several hours later.  Treated areas will swell and turn red, and blisters may form.  Vaseline and a bandaid may be applied until wound has healed.  Once healed, the skin may remain temporarily discolored.  It can take weeks to months for pigmentation to return to normal.    Destruction of lesion - Right 5th finger nail fold and DIP  Destruction method: chemical removal   Informed consent: discussed and consent obtained   Timeout:  patient name, date of birth, surgical site, and procedure verified Chemical destruction method: cantharidin   Chemical destruction method comment:  Cantharidin plus Application time:  4 hours Procedure instructions: patient instructed to wash and dry area   Outcome: patient tolerated procedure well with no complications   Post-procedure details: wound care instructions given   Additional details:  Patient advised to wash off in 4 hours or sooner if becomes irritated.    Lentigines - Scattered tan macules - Discussed due to sun exposure - Benign, observe - Recommend daily broad spectrum sunscreen SPF 30+ to sun-exposed areas, reapply every 2 hours as needed. - Call for any changes  Melanocytic Nevi - Tan-brown and/or pink-flesh-colored symmetric macules and papules - Benign appearing on exam today - Observation - Call clinic for new or changing moles - Recommend daily use of broad spectrum spf 30+ sunscreen to sun-exposed areas.    Return in about 4 months (around 06/05/2020) for follow up.  Graciella Belton, RMA, am acting as scribe for Forest Gleason, MD .  Documentation: I have reviewed the  above documentation for accuracy and completeness, and I agree with the above.  Forest Gleason, MD

## 2020-02-06 NOTE — Patient Instructions (Addendum)
Wound Care Instructions  1. Cleanse wound gently with soap and water once a day then pat dry with clean gauze. Apply a thing coat of Petrolatum (petroleum jelly, "Vaseline") over the wound (unless you have an allergy to this). We recommend that you use a new, sterile tube of Vaseline. Do not pick or remove scabs. Do not remove the yellow or white "healing tissue" from the base of the wound.  2. Cover the wound with fresh, clean, nonstick gauze and secure with paper tape. You may use Band-Aids in place of gauze and tape if the would is small enough, but would recommend trimming much of the tape off as there is often too much. Sometimes Band-Aids can irritate the skin.  3. You should call the office for your biopsy report after 1 week if you have not already been contacted.  4. If you experience any problems, such as abnormal amounts of bleeding, swelling, significant bruising, significant pain, or evidence of infection, please call the office immediately.  5. FOR ADULT SURGERY PATIENTS: If you need something for pain relief you may take 1 extra strength Tylenol (acetaminophen) AND 2 Ibuprofen (200mg  each) together every 4 hours as needed for pain. (do not take these if you are allergic to them or if you have a reason you should not take them.) Typically, you may only need pain medication for 1 to 3 days.     Melanoma ABCDEs  Melanoma is the most dangerous type of skin cancer, and is the leading cause of death from skin disease.  You are more likely to develop melanoma if you:  Have light-colored skin, light-colored eyes, or red or blond hair  Spend a lot of time in the sun  Tan regularly, either outdoors or in a tanning bed  Have had blistering sunburns, especially during childhood  Have a close family member who has had a melanoma  Have atypical moles or large birthmarks  Early detection of melanoma is key since treatment is typically straightforward and cure rates are extremely high if  we catch it early.   The first sign of melanoma is often a change in a mole or a new dark spot.  The ABCDE system is a way of remembering the signs of melanoma.  A for asymmetry:  The two halves do not match. B for border:  The edges of the growth are irregular. C for color:  A mixture of colors are present instead of an even brown color. D for diameter:  Melanomas are usually (but not always) greater than 32mm - the size of a pencil eraser. E for evolution:  The spot keeps changing in size, shape, and color.  Please check your skin once per month between visits. You can use a small mirror in front and a large mirror behind you to keep an eye on the back side or your body.   If you see any new or changing lesions before your next follow-up, please call to schedule a visit.  Please continue daily skin protection including broad spectrum sunscreen SPF 30+ to sun-exposed areas, reapplying every 2 hours as needed when you're outdoors.    Instructions for After In-Office Application of Cantharidin  1. This is a strong medicine; please follow ALL instructions.  2. Gently wash off with soap and water in four hours or sooner s directed by your physician.  3. **WARNING** this medicine can cause severe blistering, blood blisters, infection, and/or scarring if it is not washed off as directed.  4. Your progress will be rechecked in 1-2 months; call sooner if there are any questions or problems.  Cantharidin is a blistering agent that comes from a beetle.  It needs to be washed off in about 4 hours after application.  Although it is painless when applied in office, it may cause symptoms of mild pain and burning several hours later.  Treated areas will swell and turn red, and blisters may form.  Vaseline and a bandaid may be applied until wound has healed.  Once healed, the skin may remain temporarily discolored.  It can take weeks to months for pigmentation to return to normal.

## 2020-02-09 ENCOUNTER — Encounter: Payer: Self-pay | Admitting: Dermatology

## 2020-02-11 NOTE — Progress Notes (Signed)
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 On the spectrum from normal mole to melanoma skin cancer, this is in between the two but closer towards a melanoma skin cancer.  - The treatment of choice for severely atypical moles is to cut them out in clinic with an area of normal looking skin around them to get all the atypical cells out. The skin that is removed will be sent to check under the microscope again to be sure it looks completely out.   - People who have a history of atypical moles do have a slightly increased risk of developing melanoma somewhere on the body, so a full body skin exam by a dermatologist is recommended at least once a year. - Monthly self skin checks and daily sun protection are also recommended.  - Please also call if you notice any new or changing spots anywhere else on the body before your follow-up visit.   Discussed with patient and all questions answered.  MAs please call to schedule excision. Thank you!

## 2020-02-11 NOTE — Progress Notes (Signed)
New Patient Note  RE: Angela Rocha MRN: [id number] DOB: 09-24-1995 Date of Office Visit: 02/12/2020  Referring provider: Coral Ceo, NP Primary care provider: Carren Rang, PA-C  Chief Complaint: Asthma (Has been not controlled ) and Allergy Testing (Has been sick from cats and thinks mold is another trigger per dorm life //Off zyrtec from last Friday )  History of Present Illness: I had the pleasure of seeing Angela Rocha for initial evaluation at the [facility] on 02/12/2020. She is a 25 y.o. female, who is referred here by pulmonology for the evaluation of allergic rhinitis and asthma.  Rhinitis:  She reports symptoms of itchy eyes, scratchy throat, rash, body pruritis, headaches, rhinorrhea, nasal congestion, sneezing. Symptoms have been going on for 3+ years. The symptoms are present all year around with worsening in spring. Other triggers include exposure to pollen. Anosmia: no. Headache: yes. She has used zyrtec, clortab, saline rinse, Flonase prn with fair improvement in symptoms. Sinus infections: 10. Previous work up includes: none. Previous ENT evaluation: not recently - had T&A, myringotomy tubes. Previous sinus imaging: no. History of nasal polyps: no. Last eye exam: in December 2021. History of reflux: sometimes and takes OTC medications prn with good benefit.  Asthma: ACT score 12.  She reports symptoms of chest tightness, shortness of breath, coughing, wheezing, nocturnal awakenings for 4-5 years. Current medications include Symbicort 2 puffs BID x 3 yrs and albuterol hfa and neb which help. She reports using aerochamber with inhalers. She tried the following inhalers: Advair - caused shaking. Main triggers are exertion, strong scents, infections. In the last month, frequency of symptoms: at least twice per week. Frequency of nocturnal symptoms: 2-4x/month. Frequency of SABA use: at least twice per week. Interference with physical activity:  yes. Sleep is undisturbed. In the last 12 months, emergency room visits/urgent care visits/doctor office visits or hospitalizations due to respiratory issues: 3 times to the ER, 4 times to doctor's visit. In the last 12 months, oral steroids courses: 7. Lifetime history of hospitalization for respiratory issues: one. Prior intubations: no. History of pneumonia: twice last year. She was evaluated by pulmonologist in the past. Smoking exposure: no. Up to date with flu vaccine: no. Up to date with COVID-19 vaccine: no. Had COVID-19 infection in January 2022.  Tried Singulair in the past and not sure why she stopped.  No prior biologics for asthma.   GI: Patient noted that she has diarrhea after eating dairy products and fried foods. She tried lactaid products with minimal benefit. Eggs cause perioral rash and diarrhea. Burger and red meat causes diarrhea and rash. No recent tick bites.   Past work up includes:  Dietary History: patient has been eating other foods including limited milk, limited eggs, shellfish - crab, wheat, meats, fruits and vegetables. Not sure about prior sesame, fish, soy ingestion.    Limiting peanuts/tree nuts ingestion due to hives and nasal congestion in the past.  01/08/2020 pulmonology visit: "25 year old female followed in our office for asthma.  Last seen a year ago.  Patient presenting today requesting refills of Symbicort 160 as well as her rescue inhaler.  She reports that she was diagnosed with pneumonia in October/2021.  Also patient is prone to sinus infections.  Last blood work in chart does not show elevated eosinophil count.  Patient is requesting allergy eval today.  Patient's last steroids were in October/2021.  Last antibiotics were also at that time.  She reports that x-rays  have been repeated by primary care as well as school student health (patient goes to [facility]).  Showing clearing of pneumonia.  Patient continues to have ongoing postnasal drip and  congestion.  She is adherent to a daily antihistamine.  She reports that she has to take Benadryl most nights to help with symptoms.  She is not utilizing nasal saline rinses."  Component     Latest Ref Rng & Units 01/08/2020  WBC     4.0 - 10.5 K/uL 5.8  RBC     3.87 - 5.11 Mil/uL 4.23  Hemoglobin     12.0 - 15.0 g/dL 47.8  HCT     29.5 - 62.1 % 35.8 (L)  MCV     78.0 - 100.0 fl 84.6  MCH     26.6 - 33.0 pg   MCHC     30.0 - 36.0 g/dL 30.8  RDW     65.7 - 84.6 % 14.9  Platelets     150.0 - 400.0 K/uL 215.0  Neutrophils     43.0 - 77.0 % 53.2  Lymphs     Not Estab. %   Monocytes     Not Estab. %   Eos     Not Estab. %   Basos     Not Estab. %   NEUT#     1.4 - 7.7 K/uL 3.1  Lymphocyte #     0.7 - 4.0 K/uL 2.2  Monocytes Absolute     0.1 - 0.9 x10E3/uL   EOS (ABSOLUTE)     0.0 - 0.4 x10E3/uL   Basophils Absolute     0.0 - 0.1 K/uL 0.0  Immature Granulocytes     Not Estab. %   Immature Grans (Abs)     0.0 - 0.1 x10E3/uL   Lymphocytes     12.0 - 46.0 % 37.1  Monocytes Relative     3.0 - 12.0 % 7.0  Eosinophil     0.0 - 5.0 % 1.9  Basophil     0.0 - 3.0 % 0.8  Monocyte #     0.1 - 1.0 K/uL 0.4  Eosinophils Absolute     0.0 - 0.7 K/uL 0.1   Component     Latest Ref Rng & Units 01/08/2020  IgE (Immunoglobulin E), Serum     <OR=114 kU/L 28   Assessment and Plan: Angela Rocha is a 25 y.o. female with: Other allergic rhinitis Perennial rhinoconjunctivitis symptoms for the last 3+ years with worsening in the spring.  Tried Zyrtec, Chlortab, saline rinse and Flonase with some benefit.  No prior allergy evaluation.  Had T&A and myringotomy tubes as a child.  Today's skin testing showed: Negative to indoor/outdoor allergens.  Will double check results via bloodwork instead of intradermals as already getting bloodwork for other reasons.   May use over the counter antihistamines such as Zyrtec (cetirizine), Claritin (loratadine), Allegra (fexofenadine), or Xyzal  (levocetirizine) daily as needed. May take twice a day during flares.   May use olopatadine eye drops 0.2% once a day as needed for itchy/watery eyes.  Do not put directly over the contact lenses. Wait about 15 minutes before contact lens in.  May use Flonase (fluticasone) nasal spray 1 spray per nostril twice a day as needed for nasal congestion.   Nasal saline spray (i.e., Simply Saline) or nasal saline lavage (i.e., NeilMed) is recommended as needed and prior to medicated nasal sprays.  If no allergies found via bloodwork then will refer to  ENT next.  Asthma, not well controlled Diagnosed with asthma about 5 years ago and currently on Symbicort 160 mcg 2 puffs twice a day and albuterol as needed with good benefit.  Advair caused shaking.  Triggers include exertion, strong scents and infections.  Had multiple courses of oral prednisone the last year. COVID-19 infection in January 2022 - not vaccinated. No prior biologics for asthma. Used Singulair in the past and not sure why it was stopped.   ACT score 12.  Spirometry consistent with mild obstructive disease with greater than 200cc improvement in FEV1 post bronchodilator treatment. Clinically feeling improved.  . Daily controller medication(s):  o Start Trelegy 1 puff daily. Rinse mouth after each use. Sample given. o Stop Symbicort for now.  . May use albuterol rescue inhaler 2 puffs every 4 to 6 hours as needed for shortness of breath, chest tightness, coughing, and wheezing. May use albuterol rescue inhaler 2 puffs 5 to 15 minutes prior to strenuous physical activities. Monitor frequency of use.  . Repeat spirometry at next visit. . Get blood work to see if she would qualify for Biologics.  Eosinophil counts were 100 during last blood draw but patient states she just finished prednisone at that time. No prednisone use for the past 2 weeks. Ige 28.  Adverse reaction to food, subsequent encounter Patient concerned about food  allergies. Dairy and fried foods cause diarrhea even with lactaid pills. Eggs cause perioral rash and diarrhea. Red meat cause diarrhea and rash - no recent tick bites. Peanuts/tree nuts caused hives and nasal congestion. No formal work up.  Today's skin testing was negative to select foods.   Keep GI appointment.   Keep track of symptoms and write down what you had eaten/come across those days.   Avoid foods that are bothersome - milk, egg, red meat, peanuts, tree nuts.  Get bloodwork to double check results.   I have prescribed epinephrine injectable and demonstrated proper use. For mild symptoms you can take over the counter antihistamines such as Benadryl and monitor symptoms closely. If symptoms worsen or if you have severe symptoms including breathing issues, throat closure, significant swelling, whole body hives, severe diarrhea and vomiting, lightheadedness then inject epinephrine and seek immediate medical care afterwards.  Action plan given.   History of frequent upper respiratory infection Frequent sinus infections, pneumonia and ear infections in the past.  No prior immune evaluation.  Had 7 courses of antibiotics in the last year.  Received tetanus shot in December 2021.  Keep track of infections.  Get basic immune evaluation blood work.  Drug reaction Broke out in hives or child after Augmentin.  Treated as outpatient.  Doxycycline causes rash with nausea and vomiting.  Given her clinical history of frequent upper respiratory infections I recommend that she undergoes penicillin skin testing and challenge in the future.  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  No prior work-up.  Continue to avoid.   Get bloodwork.  History of urticaria History of breaking out in hives with certain personal care products.  Gave handout on proper skin care measures.  Heartburn Takes over-the-counter medications as  needed with good benefit.  See below for dietary and lifestyle modifications.  Return in about 2 months (around 04/11/2020).  Meds ordered this encounter  Medications  . fluticasone (FLONASE) 50 MCG/ACT nasal spray    Sig: Place 1 spray into both nostrils 2 (two) times daily as needed for  allergies.    Dispense:  16 g    Refill:  5  . Olopatadine HCl 0.2 % SOLN    Sig: Apply 1 drop to eye daily as needed (itchy/watery eyes). Do not put directly over contacts. Wait 15 minutes.    Dispense:  2.5 mL    Refill:  5  . Fluticasone-Umeclidin-Vilant (TRELEGY ELLIPTA) 200-62.5-25 MCG/INH AEPB    Sig: Inhale 1 puff into the lungs daily. Rinse mouth after each use.    Dispense:  60 each    Refill:  5    Do not fill until patient ready to pick up.  Marland Kitchen EPINEPHrine (AUVI-Q) 0.3 mg/0.3 mL IJ SOAJ injection    Sig: Inject 0.3 mg into the muscle as needed for anaphylaxis.    Dispense:  1 each    Refill:  2    Lab Orders     Alpha-Gal Panel     IgG, IgA, IgM     IgG 1, 2, 3, and 4     Complement, total     CBC with Differential/Platelet     Strep pneumoniae 23 Serotypes IgG     Diphtheria / Tetanus Antibody Panel     Allergen Hymenoptera Panel     Tryptase     Allergens w/Total IgE Area 2     Allergen Egg White     IgE Milk w/ Component Reflex     IgE Nut Prof. w/Component Rflx     Allergen, Israel Pig Epithelium, e6  Other allergy screening: Food allergy: no Medication allergy: yes  Augmentin - hives at age 15. Patient has not had it since then. Only required outpatient treatment.  Doxycycline - rash, nausea/vomiting.   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 No prior work up. Urticaria: yes sometimes from personal care products.  Eczema:no History of recurrent infections suggestive of immunodeficency:  Patient has history multiple infections including sinus infection, pneumonia, ear infections. Denies any GI infections/diarrhea, skin infections/abscesses. Patient has no  history of opportunistic infections including fungal infections, viral infections.   Patient reports 7 antibiotic use in the last 12 months and 0 hospital admissions. Patient does not have any secondary causes of immunodeficiency including chronic steroid use, diabetes mellitus, protein losing enteropathy, renal or hepatic dysfunction, history of cancer or irradiation or history of HIV, hepatitis B or C.  Patient just got tetanus injection in December 2021.   Diagnostics: Spirometry:  Tracings reviewed. Her effort: It was hard to get consistent efforts and there is a question as to whether this reflects a maximal maneuver. FVC: 3.39L FEV1: 2.24L, 68% predicted FEV1/FVC ratio: 66% Interpretation: Spirometry consistent with mild obstructive disease with greater than 200cc improvement in FEV1 post bronchodilator treatment. Clinically feeling improved.  Please see scanned spirometry results for details.  Skin Testing: Environmental allergy panel and select foods. Negative test to: Indoor/outdoor allergens and select foods.  Results discussed with patient/family.  Airborne Adult Perc - 02/12/20 1458    Time Antigen Placed 1448    Allergen Manufacturer Waynette Buttery    Location Back    Number of Test 59    Panel 1 Select    1. Control-Buffer 50% Glycerol Negative    2. Control-Histamine 1 mg/ml 2+    3. Albumin saline Negative    4. Bahia Negative    5. French Southern Territories Negative    6. Johnson Negative    7. Kentucky Blue Negative    8. Meadow Fescue Negative    9. Perennial Rye  Negative    10. Sweet Vernal Negative    11. Timothy Negative    12. Cocklebur Negative    13. Burweed Marshelder Negative    14. Ragweed, short Negative    15. Ragweed, Giant Negative    16. Plantain,  English Negative    17. Lamb's Quarters Negative    18. Sheep Sorrell Negative    19. Rough Pigweed Negative    20. Marsh Elder, Rough Negative    21. Mugwort, Common Negative    22. Ash mix Negative    23. Birch mix  Negative    24. Beech American Negative    25. Box, Elder Negative    26. Cedar, red Negative    27. Cottonwood, Guinea-Bissau Negative    28. Elm mix Negative    29. Hickory Negative    30. Maple mix Negative    31. Oak, Guinea-Bissau mix Negative    32. Pecan Pollen Negative    33. Pine mix Negative    34. Sycamore Eastern Negative    35. Walnut, Black Pollen Negative    36. Alternaria alternata Negative    37. Cladosporium Herbarum Negative    38. Aspergillus mix Negative    39. Penicillium mix Negative    40. Bipolaris sorokiniana (Helminthosporium) Negative    41. Drechslera spicifera (Curvularia) Negative    42. Mucor plumbeus Negative    43. Fusarium moniliforme Negative    44. Aureobasidium pullulans (pullulara) Negative    45. Rhizopus oryzae Negative    46. Botrytis cinera Negative    47. Epicoccum nigrum Negative    48. Phoma betae Negative    49. Candida Albicans Negative    50. Trichophyton mentagrophytes Negative    51. Mite, D Farinae  5,000 AU/ml Negative    52. Mite, D Pteronyssinus  5,000 AU/ml Negative    53. Cat Hair 10,000 BAU/ml Negative    54.  Dog Epithelia Negative    55. Mixed Feathers Negative    56. Horse Epithelia Negative    57. Cockroach, German Negative    58. Mouse Negative    59. Tobacco Leaf Negative          Food Adult Perc - 02/12/20 1600    Time Antigen Placed 1458    Allergen Manufacturer Waynette Buttery    Location Back    Number of allergen test 33    Control-Histamine 1 mg/ml 2+    1. Peanut Negative    2. Soybean Negative    3. Wheat Negative    4. Sesame Negative    5. Milk, cow Negative    6. Egg White, Chicken Negative    7. Casein Negative    8. Shellfish Mix Negative    9. Fish Mix Negative    10. Cashew Negative    11. Pecan Food Negative    12. Walnut Food Negative    13. Almond Negative    14. Hazelnut Negative    15. [place] nut Negative    16. Coconut Negative    17. Pistachio Negative    18. Catfish Negative    19. Bass  Negative    20. Trout Negative    21. Tuna Negative    22. Salmon Negative    23. Flounder Negative    24. Codfish Negative    25. Shrimp Negative    26. Crab Negative    27. Lobster Negative    28. Oyster Negative    29. Scallops Negative  37. Pork Negative    40. Beef Negative    2. Israel Pig Negative          Past Medical History: Patient Active Problem List   Diagnosis Date Noted  . Asthma, not well controlled 02/12/2020  . History of frequent upper respiratory infection 02/12/2020  . Adverse reaction to food, subsequent encounter 02/12/2020  . Drug reaction 02/12/2020  . Heartburn 02/12/2020  . 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 History of urticaria 02/12/2020  . Other allergic rhinitis 01/08/2020  . Pyelonephritis 05/17/2017  . 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 Vitamin D deficiency 01/12/2016  . B12 deficiency 01/12/2016  . 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 Quadriceps tendinitis 01/06/2015  . Femoral neck stress fracture 01/06/2015  . 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/06/2015  . Cephalalgia 03/12/2014   Past Medical History:  Diagnosis Date  . ADHD   . Anxiety   . Asthma   . Migraine   . Pelvic congestion syndrome   . Recurrent upper respiratory infection (URI)   . Urticaria    Past Surgical History: Past Surgical History:  Procedure Laterality Date  . ADENOIDECTOMY    . HIP SURGERY    . TONSILLECTOMY    . TYMPANOSTOMY TUBE PLACEMENT     Medication List:  Current Outpatient Medications  Medication Sig Dispense Refill  . albuterol (VENTOLIN HFA) 108 (90 Base) MCG/ACT inhaler Inhale 2 puffs into the lungs every 6 (six) hours as needed for wheezing or shortness of breath. 1 each 4  . budesonide-formoterol (SYMBICORT) 160-4.5 MCG/ACT inhaler Inhale 2 puffs into the lungs 2 (two) times daily. 1 each 5  . busPIRone (BUSPAR) 7.5 MG tablet Take 7.5 mg by mouth 2 (two) times daily.    . cetirizine (ZYRTEC) 10 MG tablet Take  10 mg by mouth daily.  3  . ciclopirox (LOPROX) 0.77 % cream Apply topically 2 (two) times daily. To feet for 3-4 weeks then once weekly 90 g 2  . dexmethylphenidate (FOCALIN) 2.5 MG tablet Take 2.5 mg by mouth daily.    Marland Kitchen EPINEPHrine (AUVI-Q) 0.3 mg/0.3 mL IJ SOAJ injection Inject 0.3 mg into the muscle as needed for anaphylaxis. 1 each 2  . fluticasone (FLONASE) 50 MCG/ACT nasal spray Place 1 spray into both nostrils 2 (two) times daily as needed for allergies. 16 g 5  . Fluticasone-Umeclidin-Vilant (TRELEGY ELLIPTA) 200-62.5-25 MCG/INH AEPB Inhale 1 puff into the lungs daily. Rinse mouth after each use. 60 each 5  . gabapentin (NEURONTIN) 100 MG capsule Take by mouth.    Marland Kitchen Galcanezumab-gnlm (EMGALITY) 120 MG/ML SOAJ Inject into the skin.    . hydrOXYzine (ATARAX/VISTARIL) 25 MG tablet Take 25 mg by mouth daily.    Marland Kitchen ketoconazole (NIZORAL) 2 % cream Apply topically.    . Olopatadine HCl 0.2 % SOLN Apply 1 drop to eye daily as needed (itchy/watery eyes). Do not put directly over contacts. Wait 15 minutes. 2.5 mL 5  . ondansetron (ZOFRAN-ODT) 4 MG disintegrating tablet Take 1 tablet (4 mg total) by mouth every 8 (eight) hours as needed for nausea or vomiting. 20 tablet 0  . rizatriptan (MAXALT-MLT) 10 MG disintegrating tablet Take 10 mg by mouth as directed. TAKE 1 TAB AT HEADACHE ONSET MAY TAKE A 2ND DOSE AFTER 2 HOURS IF NEEDED NO MORE THAN 2 DOSES/24 HRS  3  . venlafaxine XR (EFFEXOR-XR) 37.5 MG 24 hr capsule Take 37.5 mg by mouth daily.    . Vitamin D, Ergocalciferol, (DRISDOL)  1.25 MG (50000 UNIT) CAPS capsule Take 1 capsule (50,000 Units total) by mouth every 7 (seven) days. 30 capsule 1  . amphetamine-dextroamphetamine (ADDERALL XR) 20 MG 24 hr capsule Take by mouth. (Patient not taking: Reported on 02/12/2020)    . cyanocobalamin (,VITAMIN B-12,) 1000 MCG/ML injection INJECT 1 ML (1,000 MCG TOTAL) INTO THE MUSCLE EVERY 30 (THIRTY) DAYS. 10 mL 1   No current facility-administered medications  for this visit.   Allergies: Allergies  Allergen Reactions  . Doxycycline Nausea And Vomiting    nausea  . Amoxicillin-Pot Clavulanate Rash and Other (See Comments)    Has patient had a PCN reaction causing immediate rash, facial/tongue/throat swelling, SOB or lightheadedness with hypotension: Yes Has patient had a PCN reaction causing severe rash involving mucus membranes or skin necrosis: No Has patient had a PCN reaction that required hospitalization: No Has patient had a PCN reaction occurring within the last 10 years: No If all of the above answers are "NO", then may proceed with Cephalosporin use.    Social History: Social History   Socioeconomic History  . Marital status: Single    Spouse name: Not on file  . Number of children: Not on file  . Years of education: Not on file  . Highest education level: Not on file  Occupational History  . Not on file  Tobacco Use  . Smoking status: Never Smoker  . Smokeless tobacco: Never Used  Vaping Use  . Vaping Use: Never used  Substance and Sexual Activity  . Alcohol use: Yes    Alcohol/week: 0.0 standard drinks    Comment: occas  . Drug use: No  . Sexual activity: Not Currently    Birth control/protection: Pill  Other Topics Concern  . Not on file  Social History Narrative  . Not on file   Social Determinants of Health   Financial Resource Strain: Not on file  Food Insecurity: Not on file  Transportation Needs: Not on file  Physical Activity: Not on file  Stress: Not on file  Social Connections: Not on file   Lives in a 2012 apartment. Smoking: denies Occupation: full time Press photographer HistorySurveyor, minerals in the house: yes Carpet in the family room: no Carpet in the bedroom: yes Heating: electric Cooling: central Pet: yes 1 cat x < 1 yr  and 1 Israel pig x <1 yr  Family History: Family History  Problem Relation Age of Onset  . Diabetes Mellitus II Father   . Cancer Father   . Colon  cancer Father   . Cancer Paternal Grandfather   . Lung cancer Paternal Grandfather    Problem                               Relation Asthma                                   No  Eczema                                No  Food allergy                          Father - shellfish  Allergic rhino conjunctivitis     No   Review of Systems  Constitutional: Negative for appetite change, chills, fever and unexpected weight change.  HENT: Positive for congestion. Negative for rhinorrhea.   Eyes: Negative for itching.  Respiratory: Negative for cough, chest tightness, shortness of breath and wheezing.   Cardiovascular: Negative for chest pain.  Gastrointestinal: Positive for abdominal pain and diarrhea.  Genitourinary: Negative for difficulty urinating.  Skin: Negative for rash.  Neurological: Positive for headaches.   Objective: BP 118/84   Pulse (!) 103   Temp 98.3 F (36.8 C)   Resp 18   Ht 5\' 4"  (1.626 m)   Wt 155 lb 12.8 oz (70.7 kg)   LMP 01/21/2020 (Exact Date)   SpO2 95%   BMI 26.74 kg/m  Body mass index is 26.74 kg/m. Physical Exam Vitals and nursing note reviewed.  Constitutional:      Appearance: Normal appearance. She is well-developed.  HENT:     Head: Normocephalic and atraumatic.     Right Ear: Tympanic membrane and external ear normal.     Left Ear: Tympanic membrane and external ear normal.     Nose: Congestion (on right side) present.     Mouth/Throat:     Mouth: Mucous membranes are moist.     Pharynx: Oropharynx is clear.  Eyes:     Conjunctiva/sclera: Conjunctivae normal.  Cardiovascular:     Rate and Rhythm: Normal rate and regular rhythm.     Heart sounds: Normal heart sounds. No murmur heard. No friction rub. No gallop.   Pulmonary:     Effort: Pulmonary effort is normal.     Breath sounds: Normal breath sounds. No wheezing, rhonchi or rales.  Musculoskeletal:     Cervical back: Neck supple.  Skin:    General: Skin is warm.     Findings: No  rash.  Neurological:     Mental Status: She is alert and oriented to person, place, and time.  Psychiatric:        Behavior: Behavior normal.    The plan was reviewed with the patient/family, and all questions/concerned were addressed.  It was my pleasure to see Nalisha today and participate in her care. Please feel free to contact me with any questions or concerns.  Sincerely,  Wyline Mood, DO Allergy & Immunology  [facility] office: [phone] [facility] office: [phone]  85 minutes spent face-to-face with more than 50% of the time spent discussing environmental allergies, adverse food reaction, asthma, immunodeficiency, hymenoptera reaction, drug allergies.

## 2020-02-12 ENCOUNTER — Encounter: Payer: Self-pay | Admitting: Allergy

## 2020-02-12 ENCOUNTER — Ambulatory Visit: Payer: BC Managed Care – PPO | Admitting: Allergy

## 2020-02-12 ENCOUNTER — Other Ambulatory Visit: Payer: Self-pay

## 2020-02-12 VITALS — BP 118/84 | HR 103 | Temp 98.3°F | Resp 18 | Ht 64.0 in | Wt 155.8 lb

## 2020-02-12 DIAGNOSIS — J3089 Other allergic rhinitis: Secondary | ICD-10-CM | POA: Diagnosis not present

## 2020-02-12 DIAGNOSIS — Z872 Personal history of diseases of the skin and subcutaneous tissue: Secondary | ICD-10-CM

## 2020-02-12 DIAGNOSIS — T781XXD Other adverse food reactions, not elsewhere classified, subsequent encounter: Secondary | ICD-10-CM | POA: Insufficient documentation

## 2020-02-12 DIAGNOSIS — J45909 Unspecified asthma, uncomplicated: Secondary | ICD-10-CM

## 2020-02-12 DIAGNOSIS — T50905A Adverse effect of unspecified drugs, medicaments and biological substances, initial encounter: Secondary | ICD-10-CM | POA: Insufficient documentation

## 2020-02-12 DIAGNOSIS — T50905D Adverse effect of unspecified drugs, medicaments and biological substances, subsequent encounter: Secondary | ICD-10-CM | POA: Diagnosis not present

## 2020-02-12 DIAGNOSIS — Z8709 Personal history of other diseases of the respiratory system: Secondary | ICD-10-CM | POA: Insufficient documentation

## 2020-02-12 DIAGNOSIS — Z91038 Other insect allergy status: Secondary | ICD-10-CM

## 2020-02-12 DIAGNOSIS — R12 Heartburn: Secondary | ICD-10-CM | POA: Diagnosis not present

## 2020-02-12 MED ORDER — FLUTICASONE PROPIONATE 50 MCG/ACT NA SUSP
1.0000 | Freq: Two times a day (BID) | NASAL | 5 refills | Status: AC | PRN
Start: 2020-02-12 — End: ?

## 2020-02-12 MED ORDER — TRELEGY ELLIPTA 200-62.5-25 MCG/INH IN AEPB: 1.0000 | INHALATION_SPRAY | Freq: Every day | RESPIRATORY_TRACT | 5 refills | Status: DC

## 2020-02-12 MED ORDER — EPINEPHRINE 0.3 MG/0.3ML IJ SOAJ: 0.3000 mg | INTRAMUSCULAR | 2 refills | Status: DC | PRN

## 2020-02-12 MED ORDER — OLOPATADINE HCL 0.2 % OP SOLN: 1.0000 [drp] | Freq: Every day | OPHTHALMIC | 5 refills | Status: DC | PRN

## 2020-02-12 NOTE — Assessment & Plan Note (Signed)
Broke out in hives or child after Augmentin.  Treated as outpatient.  Doxycycline causes rash with nausea and vomiting.  Given her clinical history of frequent upper respiratory infections I recommend that she undergoes penicillin skin testing and challenge in the future.

## 2020-02-12 NOTE — Assessment & Plan Note (Signed)
Patient concerned about food allergies. Dairy and fried foods cause diarrhea even with lactaid pills. Eggs cause perioral rash and diarrhea. Red meat cause diarrhea and rash - no recent tick bites. Peanuts/tree nuts caused hives and nasal congestion. No formal work up.  Today's skin testing was negative to select foods.   Keep GI appointment.   Keep track of symptoms and write down what you had eaten/come across those days.   Avoid foods that are bothersome - milk, egg, red meat, peanuts, tree nuts.  Get bloodwork to double check results.   I have prescribed epinephrine injectable and demonstrated proper use. For mild symptoms you can take over the counter antihistamines such as Benadryl and monitor symptoms closely. If symptoms worsen or if you have severe symptoms including breathing issues, throat closure, significant swelling, whole body hives, severe diarrhea and vomiting, lightheadedness then inject epinephrine and seek immediate medical care afterwards.  Action plan given.

## 2020-02-12 NOTE — Assessment & Plan Note (Addendum)
Takes over-the-counter medications as needed with good benefit.  See below for dietary and lifestyle modifications.

## 2020-02-12 NOTE — Assessment & Plan Note (Addendum)
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  No prior work-up.  Continue to avoid.   Get bloodwork.

## 2020-02-12 NOTE — Assessment & Plan Note (Signed)
Perennial rhinoconjunctivitis symptoms for the last 3+ years with worsening in the spring.  Tried Zyrtec, Chlortab, saline rinse and Flonase with some benefit.  No prior allergy evaluation.  Had T&A and myringotomy tubes as a child.  Today's skin testing showed: Negative to indoor/outdoor allergens.  Will double check results via bloodwork instead of intradermals as already getting bloodwork for other reasons.   May use over the counter antihistamines such as Zyrtec (cetirizine), Claritin (loratadine), Allegra (fexofenadine), or Xyzal (levocetirizine) daily as needed. May take twice a day during flares.   May use olopatadine eye drops 0.2% once a day as needed for itchy/watery eyes.  Do not put directly over the contact lenses. Wait about 15 minutes before contact lens in.  May use Flonase (fluticasone) nasal spray 1 spray per nostril twice a day as needed for nasal congestion.   Nasal saline spray (i.e., Simply Saline) or nasal saline lavage (i.e., NeilMed) is recommended as needed and prior to medicated nasal sprays.  If no allergies found via bloodwork then will refer to ENT next.

## 2020-02-12 NOTE — Assessment & Plan Note (Addendum)
Diagnosed with asthma about 5 years ago and currently on Symbicort 160 mcg 2 puffs twice a day and albuterol as needed with good benefit.  Advair caused shaking.  Triggers include exertion, strong scents and infections.  Had multiple courses of oral prednisone the last year. COVID-19 infection in January 2022 - not vaccinated. No prior biologics for asthma. Used Singulair in the past and not sure why it was stopped.   ACT score 12.  Spirometry consistent with mild obstructive disease with greater than 200cc improvement in FEV1 post bronchodilator treatment. Clinically feeling improved.  . Daily controller medication(s):  o Start Trelegy 224mcg 1 puff daily. Rinse mouth after each use. Sample given. o Stop Symbicort for now.  . May use albuterol rescue inhaler 2 puffs every 4 to 6 hours as needed for shortness of breath, chest tightness, coughing, and wheezing. May use albuterol rescue inhaler 2 puffs 5 to 15 minutes prior to strenuous physical activities. Monitor frequency of use.  . Repeat spirometry at next visit. . Get blood work to see if she would qualify for Biologics.  Eosinophil counts were 100 during last blood draw but patient states she just finished prednisone at that time. No prednisone use for the past 2 weeks. Ige 30.

## 2020-02-12 NOTE — Assessment & Plan Note (Addendum)
Frequent sinus infections, pneumonia and ear infections in the past.  No prior immune evaluation.  Had 7 courses of antibiotics in the last year.  Received tetanus shot in December 2021.  Keep track of infections.  Get basic immune evaluation blood work.

## 2020-02-12 NOTE — Patient Instructions (Addendum)
Today's skin testing showed: Negative to indoor/outdoor allergens. Negative to select foods. Results given.   Environmental allergies:   Will double check via bloodwork.  May use over the counter antihistamines such as Zyrtec (cetirizine), Claritin (loratadine), Allegra (fexofenadine), or Xyzal (levocetirizine) daily as needed. May take twice a day during flares.   May use olopatadine eye drops 0.2% once a day as needed for itchy/watery eyes.  Do not put directly over the contact lenses. Wait about 15 minutes before contact lens in.  May use Flonase (fluticasone) nasal spray 1 spray per nostril twice a day as needed for nasal congestion.   Nasal saline spray (i.e., Simply Saline) or nasal saline lavage (i.e., NeilMed) is recommended as needed and prior to medicated nasal sprays.  If no allergies on bloodwork then will refer to ENT next.   Asthma . Daily controller medication(s):  o Start Trelegy 252mcg 1 puff daily. Rinse mouth after each use. Sample given. - Let me know how it's working.  o Stop Symbicort for now.  . May use albuterol rescue inhaler 2 puffs every 4 to 6 hours as needed for shortness of breath, chest tightness, coughing, and wheezing. May use albuterol rescue inhaler 2 puffs 5 to 15 minutes prior to strenuous physical activities. Monitor frequency of use.  . Asthma control goals:  o Full participation in all desired activities (may need albuterol before activity) o Albuterol use two times or less a week on average (not counting use with activity) o Cough interfering with sleep two times or less a month o Oral steroids no more than once a year o No hospitalizations  Infections:  Keep track of infections: Get bloodwork:  We are ordering labs, so please allow 1-2 weeks for the results to come back. With the newly implemented Cures Act, the labs might be visible to you at the same time that they become visible to me. However, I will not address the results until  all of the results are back, so please be patient.   GI/food:  Keep GI appointment.   Keep track of symptoms and write down what you had eaten/come across those days.   Avoid foods that are bothersome - milk, egg, red meat, peanuts, tree nuts.  Get bloodwork to double check results.   I have prescribed epinephrine injectable and demonstrated proper use. For mild symptoms you can take over the counter antihistamines such as Benadryl and monitor symptoms closely. If symptoms worsen or if you have severe symptoms including breathing issues, throat closure, significant swelling, whole body hives, severe diarrhea and vomiting, lightheadedness then inject epinephrine and seek immediate medical care afterwards.  Action plan given.   Penicillin allergy:  Consider penicillin skin testing/drug challenge in the future.  If interested we can schedule drug challenge to penicillin. You must be off antihistamines for 3-5 days before. Must be in good health and not ill. Plan on being in the office for 2-3 hours and must bring in the drug you want to do the oral challenge for - will send in prescription to pick up a few days before. You must call to schedule an appointment and specify it's for a drug challenge.   More than 90% of patients outgrow their penicillin allergy.   Bee sting reaction:  Continue to avoid.   Get bloodwork.  Heartburn:  See below for lifestyle modifications.  Follow up in 2 months or sooner if needed.   Recommend getting the COVID-19 vaccine.  Here's more information: http://www.harrington.info/  Skin  care recommendations  Bath time: . Always use lukewarm water. AVOID very hot or cold water. Marland Kitchen Keep bathing time to 5-10 minutes. . Do NOT use bubble bath. . Use a mild soap and use just enough to wash the dirty areas. . Do NOT scrub skin vigorously.  . After bathing, pat dry your skin with a towel. Do NOT rub or scrub the skin.  Moisturizers and  prescriptions:  . ALWAYS apply moisturizers immediately after bathing (within 3 minutes). This helps to lock-in moisture. . Use the moisturizer several times a day over the whole body. Kermit Balo summer moisturizers include: Aveeno, CeraVe, Cetaphil. Kermit Balo winter moisturizers include: Aquaphor, Vaseline, Cerave, Cetaphil, Eucerin, Vanicream. . When using moisturizers along with medications, the moisturizer should be applied about one hour after applying the medication to prevent diluting effect of the medication or moisturize around where you applied the medications. When not using medications, the moisturizer can be continued twice daily as maintenance.  Laundry and clothing: . Avoid laundry products with added color or perfumes. . Use unscented hypo-allergenic laundry products such as Tide free, Cheer free & gentle, and All free and clear.  . If the skin still seems dry or sensitive, you can try double-rinsing the clothes. . Avoid tight or scratchy clothing such as wool. . Do not use fabric softeners or dyer sheets.   Heartburn Heartburn is a type of pain or discomfort that can happen in your throat or chest. It is often described as a burning pain. It may also cause a bad, acid-like taste in your mouth. It may be caused by stomach contents that move back up (reflux) into the part of the body that moves food from your mouth to your stomach (esophagus). Heartburn may feel worse:  When you lie down.  When you bend over.  At night. Follow these instructions at home: Eating and drinking  Avoid certain foods and drinks as told by your doctor. This may include: ? Coffee and tea, with or without caffeine. ? Drinks that have alcohol. ? Energy drinks and sports drinks. ? Carbonated drinks or sodas. ? Chocolate and cocoa. ? Peppermint and mint flavorings. ? Garlic and onions. ? Horseradish. ? Spicy and acidic foods, such as:  Peppers.  Chili powder and curry powder.  Vinegar.  Hot  sauces and BBQ sauce. ? Citrus fruit juices and citrus fruits, such as:  Oranges.  Lemons.  Limes. ? Tomato-based foods, such as:  Red sauce and pizza with red sauce.  Chili.  Salsa. ? Fried and fatty foods, such as:  Donuts.  [place] fries and potato chips.  High-fat dressings. ? High-fat meats, such as:  Hot dogs and sausage.  Rib eye steak.  Ham and bacon. ? High-fat dairy items, such as:  Whole milk.  Butter.  Cream cheese.  Eat small meals often. Avoid eating large meals.  Avoid drinking large amounts of liquid with your meals.  Avoid eating meals during the 2-3 hours before bedtime.  Avoid lying down right after you eat.  Do not exercise right after you eat.   Lifestyle  If you are overweight, lose an amount of weight that is healthy for you. Ask your doctor about a safe weight loss goal.  Do not smoke or use any products that contain nicotine or tobacco. These can make your symptoms worse. If you need help quitting, ask your doctor.  Wear loose clothes. Do not wear anything tight around your waist.  Raise (elevate) the head of your  bed about 6 inches (15 cm) when you sleep. You can use a wedge to do this.  Try to lower your stress. If you need help doing this, ask your doctor.      Medicines  Take over-the-counter and prescription medicines only as told by your doctor.  Do not take aspirin or NSAIDs, such as ibuprofen, unless your doctor says it is okay.  Stop medicines only as told by your doctor. If you stop taking some medicines too quickly, your symptoms may get worse. General instructions  Watch for any changes in your symptoms.  Keep all follow-up visits. Contact a doctor if:  You have new symptoms.  You lose weight and you do not know why.  You have trouble swallowing, or it hurts to swallow.  You have wheezing or a cough that keeps happening.  Your symptoms do not get better with treatment.  You have heartburn often for  more than 2 weeks. Get help right away if:  You have pain in your arms, neck, jaw, teeth, or back all of a sudden.  You feel sweaty, dizzy, or light-headed all of a sudden.  You have chest pain or shortness of breath.  You vomit and your vomit looks like blood or coffee grounds.  Your poop (stool) is bloody or black. These symptoms may be an emergency. Get help right away. Call your local emergency services (911 in the [place]  Do not wait to see if the symptoms will go away.  Do not drive yourself to the hospital. Summary  Heartburn is a type of pain that can happen in your throat or chest. It can feel like a burning pain. It may also cause a bad, acid-like taste in your mouth.  You may need to avoid certain foods and drinks to help your symptoms. Ask your doctor what foods and drinks you should avoid.  Take over-the-counter and prescription medicines only as told by your doctor. Do not take aspirin or NSAIDs, such as ibuprofen, unless your doctor told you to do so.  Contact your doctor if your symptoms do not get better or they get worse. This information is not intended to replace advice given to you by your health care provider. Make sure you discuss any questions you have with your health care provider. Document Revised: 07/03/2019 Document Reviewed: 07/03/2019 Elsevier Patient Education  [place].

## 2020-02-12 NOTE — Assessment & Plan Note (Signed)
History of breaking out in hives with certain personal care products.  Gave handout on proper skin care measures.

## 2020-02-13 ENCOUNTER — Encounter: Payer: Self-pay | Admitting: Allergy & Immunology

## 2020-02-13 ENCOUNTER — Telehealth: Payer: Self-pay | Admitting: Allergy & Immunology

## 2020-02-13 NOTE — Telephone Encounter (Signed)
Letter sent via myChart

## 2020-02-13 NOTE — Telephone Encounter (Signed)
Patient called and requested a note saying that she was seen in the office on Wednesday, February 2.  She also reported that she had an lot of lab work done yesterday and she felt very fatigued today.  Therefore, she was wondering if she could get the note to excuse her for February 3 as well.  She is back in [place], so she prefers that the note be sent via [place].  Salvatore Marvel, MD [facility]

## 2020-02-20 ENCOUNTER — Ambulatory Visit: Payer: BC Managed Care – PPO | Admitting: Allergy

## 2020-02-22 LAB — ALLERGENS W/TOTAL IGE AREA 2

## 2020-02-22 LAB — CBC WITH DIFFERENTIAL/PLATELET
Basophils Absolute: 0 10*3/uL (ref 0.0–0.2)
Basos: 1 %
EOS (ABSOLUTE): 0.1 10*3/uL (ref 0.0–0.4)
Eos: 1 %
Hematocrit: 37 % (ref 34.0–46.6)
Hemoglobin: 12.2 g/dL (ref 11.1–15.9)
Immature Grans (Abs): 0 10*3/uL (ref 0.0–0.1)
Immature Granulocytes: 0 %
Lymphocytes Absolute: 1.6 10*3/uL (ref 0.7–3.1)
Lymphs: 42 %
MCH: 28.3 pg (ref 26.6–33.0)
MCHC: 33 g/dL (ref 31.5–35.7)
MCV: 86 fL (ref 79–97)
Monocytes Absolute: 0.3 10*3/uL (ref 0.1–0.9)
Monocytes: 7 %
Neutrophils Absolute: 1.8 10*3/uL (ref 1.4–7.0)
Neutrophils: 49 %
Platelets: 219 10*3/uL (ref 150–450)
RBC: 4.31 x10E6/uL (ref 3.77–5.28)
RDW: 13.6 % (ref 11.7–15.4)
WBC: 3.7 10*3/uL (ref 3.4–10.8)

## 2020-02-22 LAB — IGG, IGA, IGM
IgA/Immunoglobulin A, Serum: 160 mg/dL (ref 87–352)
IgM (Immunoglobulin M), Srm: 162 mg/dL (ref 26–217)

## 2020-02-22 LAB — STREP PNEUMONIAE 23 SEROTYPES IGG
Pneumo Ab Type 1*: 0.9 ug/mL — ABNORMAL LOW (ref >1.3)
Pneumo Ab Type 12 (12F)*: 0.2 ug/mL — ABNORMAL LOW (ref >1.3)
Pneumo Ab Type 14*: <0.1 ug/mL — ABNORMAL LOW (ref >1.3)
Pneumo Ab Type 17 (17F)*: <0.1 ug/mL — ABNORMAL LOW (ref >1.3)
Pneumo Ab Type 19 (19F)*: 1.2 ug/mL — ABNORMAL LOW (ref >1.3)
Pneumo Ab Type 2*: 0.2 ug/mL — ABNORMAL LOW (ref >1.3)
Pneumo Ab Type 20*: 0.3 ug/mL — ABNORMAL LOW (ref >1.3)
Pneumo Ab Type 22 (22F)*: <0.1 ug/mL — ABNORMAL LOW (ref >1.3)
Pneumo Ab Type 23 (23F)*: <0.1 ug/mL — ABNORMAL LOW (ref >1.3)
Pneumo Ab Type 26 (6B)*: 0.1 ug/mL — ABNORMAL LOW (ref >1.3)
Pneumo Ab Type 3*: 1.1 ug/mL — ABNORMAL LOW (ref >1.3)
Pneumo Ab Type 34 (10A)*: <0.1 ug/mL — ABNORMAL LOW (ref >1.3)
Pneumo Ab Type 4*: 0.3 ug/mL — ABNORMAL LOW (ref >1.3)
Pneumo Ab Type 43 (11A)*: 0.3 ug/mL — ABNORMAL LOW (ref >1.3)
Pneumo Ab Type 5*: 0.1 ug/mL — ABNORMAL LOW (ref >1.3)
Pneumo Ab Type 51 (7F)*: 0.2 ug/mL — ABNORMAL LOW (ref >1.3)
Pneumo Ab Type 54 (15B)*: <0.1 ug/mL — ABNORMAL LOW (ref >1.3)
Pneumo Ab Type 56 (18C)*: <0.1 ug/mL — ABNORMAL LOW (ref >1.3)
Pneumo Ab Type 57 (19A)*: 1.5 ug/mL (ref >1.3)
Pneumo Ab Type 68 (9V)*: 0.4 ug/mL — ABNORMAL LOW (ref >1.3)
Pneumo Ab Type 70 (33F)*: 0.3 ug/mL — ABNORMAL LOW (ref >1.3)
Pneumo Ab Type 8*: <0.1 ug/mL — ABNORMAL LOW (ref >1.3)
Pneumo Ab Type 9 (9N)*: <0.1 ug/mL — ABNORMAL LOW (ref >1.3)

## 2020-02-22 LAB — ALPHA-GAL PANEL
Allergen Lamb IgE: <0.1 kU/L
Beef IgE: <0.1 kU/L
IgE (Immunoglobulin E), Serum: 24 IU/mL (ref 6–495)
O215-IgE Alpha-Gal: <0.1 kU/L
Pork IgE: <0.1 kU/L

## 2020-02-22 LAB — ALLERGEN HYMENOPTERA PANEL
Bumblebee: <0.1 kU/L
Honeybee IgE: <0.1 kU/L
Hornet, White Face, IgE: <0.1 kU/L
Hornet, Yellow, IgE: <0.1 kU/L
Paper Wasp IgE: <0.1 kU/L
Yellow Jacket, IgE: <0.1 kU/L

## 2020-02-22 LAB — IGG 1, 2, 3, AND 4
IgG (Immunoglobin G), Serum: 751 mg/dL (ref 586–1602)
IgG, Subclass 1: 380 mg/dL (ref 248–810)
IgG, Subclass 2: 248 mg/dL (ref 130–555)
IgG, Subclass 3: 52 mg/dL (ref 15–102)
IgG, Subclass 4: 42 mg/dL (ref 2–96)

## 2020-02-22 LAB — ALLERGEN, GUINEA PIG EPITHELIUM, E6: Guinea Pig Epithelium: <0.1 kU/L

## 2020-02-22 LAB — IGE EGG WHITE W/COMPONENT RFLX: F001-IgE Egg White: <0.1 kU/L

## 2020-02-22 LAB — IGE NUT PROF. W/COMPONENT RFLX
F017-IgE Hazelnut (Filbert): <0.1 kU/L
F018-IgE Brazil Nut: <0.1 kU/L
F020-IgE Almond: <0.1 kU/L
F202-IgE Cashew Nut: <0.1 kU/L
F203-IgE Pistachio Nut: <0.1 kU/L
F256-IgE Walnut: <0.1 kU/L
Macadamia Nut, IgE: <0.1 kU/L
Peanut, IgE: <0.1 kU/L
Pecan Nut IgE: <0.1 kU/L

## 2020-02-22 LAB — DIPHTHERIA / TETANUS ANTIBODY PANEL
Diphtheria Ab: 0.48 IU/mL (ref <0.10)
Tetanus Ab, IgG: 3.27 IU/mL (ref <0.10)

## 2020-02-22 LAB — TRYPTASE: Tryptase: 3.2 ug/L (ref 2.2–13.2)

## 2020-02-22 LAB — IGE MILK W/ COMPONENT REFLEX: F002-IgE Milk: <0.1 kU/L

## 2020-02-22 LAB — COMPLEMENT, TOTAL: Compl, Total (CH50): 58 U/mL (ref >41)

## 2020-02-24 ENCOUNTER — Other Ambulatory Visit: Payer: Self-pay

## 2020-02-24 ENCOUNTER — Encounter: Payer: Self-pay | Admitting: Gastroenterology

## 2020-02-24 ENCOUNTER — Ambulatory Visit (INDEPENDENT_AMBULATORY_CARE_PROVIDER_SITE_OTHER): Payer: BC Managed Care – PPO | Admitting: Gastroenterology

## 2020-02-24 VITALS — BP 137/69 | HR 89 | Temp 98.0°F | Ht 64.0 in | Wt 154.5 lb

## 2020-02-24 DIAGNOSIS — Z85038 Personal history of other malignant neoplasm of large intestine: Secondary | ICD-10-CM

## 2020-02-24 DIAGNOSIS — K529 Noninfective gastroenteritis and colitis, unspecified: Secondary | ICD-10-CM | POA: Diagnosis not present

## 2020-02-24 MED ORDER — CLENPIQ 10-3.5-12 MG-GM -GM/160ML PO SOLN: 320.0000 mL | Freq: Once | ORAL | 0 refills | Status: AC

## 2020-02-24 MED ORDER — DICYCLOMINE HCL 10 MG PO CAPS: 10.0000 mg | ORAL_CAPSULE | Freq: Three times a day (TID) | ORAL | 0 refills | Status: DC | PRN

## 2020-02-24 NOTE — Progress Notes (Signed)
Cephas Darby, MD [address]  [place], [place] [address]  Main: [phone]  Fax: [phone]    Gastroenterology Consultation  Referring Provider:     Wayland Denis, PA-C Primary Care Physician:  Wayland Denis, PA-C Primary Gastroenterologist:  Dr. Cephas Darby Reason for Consultation:     Chronic diarrhea        HPI:   Angela Rocha is a 25 y.o. female referred by Dr. Wayland Denis, PA-C  for consultation & management of chronic diarrhea.  Patient reports more than 6 months history of nonbloody, loose/watery bowel about 4-5 times daily, sometimes at night associated with abdominal cramps and bloating.  She has history of asthma, allergy testing including various food allergies by her pulmonologist which were all unremarkable.  Patient did not undergo any further work-up of her diarrhea.  She did not try any over-the-counter medications for diarrhea.  Patient is also here to discuss about her family history of colon cancer.  Father had cancer at age 1.  Hetr distant cousin had colon cancer at age 86 Patient does acknowledge drinking sodas 2 g daily, she states she has been drinking for several years.  But her diarrhea symptoms are new.  She did notice intolerance to eggs and milk which she tries to avoid, provides some relief.  Her weight is stable.  She denies any stress in her life. She does not smoke or drink alcohol She does have history of recurrent sinusitis, received Z-Pak about 7 times last year  NSAIDs: None  Antiplts/Anticoagulants/Anti thrombotics: None  GI Procedures: None Denies family history of IBD, celiac disease Father with colorectal cancer at age 27 Distant cousin with colon cancer at age 41  Past Medical History:  Diagnosis Date  . ADHD   . Anxiety   . Asthma   . Migraine   . Pelvic congestion syndrome   . Recurrent upper respiratory infection (URI)   . Urticaria     Past Surgical History:  Procedure Laterality Date   . ADENOIDECTOMY    . HIP SURGERY    . TONSILLECTOMY    . TYMPANOSTOMY TUBE PLACEMENT      Current Outpatient Medications:  .  albuterol (VENTOLIN HFA) 108 (90 Base) MCG/ACT inhaler, Inhale 2 puffs into the lungs every 6 (six) hours as needed for wheezing or shortness of breath., Disp: 1 each, Rfl: 4 .  amphetamine-dextroamphetamine (ADDERALL XR) 20 MG 24 hr capsule, Take by mouth., Disp: , Rfl:  .  budesonide-formoterol (SYMBICORT) 160-4.5 MCG/ACT inhaler, Inhale 2 puffs into the lungs 2 (two) times daily., Disp: 1 each, Rfl: 5 .  busPIRone (BUSPAR) 7.5 MG tablet, Take 7.5 mg by mouth 2 (two) times daily., Disp: , Rfl:  .  cetirizine (ZYRTEC) 10 MG tablet, Take 10 mg by mouth daily., Disp: , Rfl: 3 .  ciclopirox (LOPROX) 0.77 % cream, Apply topically 2 (two) times daily. To feet for 3-4 weeks then once weekly, Disp: 90 g, Rfl: 2 .  cyanocobalamin (,VITAMIN B-12,) 1000 MCG/ML injection, INJECT 1 ML (1,000 MCG TOTAL) INTO THE MUSCLE EVERY 30 (THIRTY) DAYS., Disp: 10 mL, Rfl: 1 .  dexmethylphenidate (FOCALIN) 2.5 MG tablet, Take 2.5 mg by mouth daily., Disp: , Rfl:  .  dicyclomine (BENTYL) 10 MG capsule, Take 1 capsule (10 mg total) by mouth 3 (three) times daily with meals as needed for spasms., Disp: 90 capsule, Rfl: 0 .  EPINEPHrine (AUVI-Q) 0.3 mg/0.3 mL IJ SOAJ injection, Inject 0.3 mg into  the muscle as needed for anaphylaxis., Disp: 1 each, Rfl: 2 .  fluticasone (FLONASE) 50 MCG/ACT nasal spray, Place 1 spray into both nostrils 2 (two) times daily as needed for allergies., Disp: 16 g, Rfl: 5 .  Fluticasone-Umeclidin-Vilant (TRELEGY ELLIPTA) 200-62.5-25 MCG/INH AEPB, Inhale 1 puff into the lungs daily. Rinse mouth after each use., Disp: 60 each, Rfl: 5 .  gabapentin (NEURONTIN) 100 MG capsule, Take by mouth., Disp: , Rfl:  .  Galcanezumab-gnlm (EMGALITY) 120 MG/ML SOAJ, Inject into the skin., Disp: , Rfl:  .  hydrOXYzine (ATARAX/VISTARIL) 25 MG tablet, Take 25 mg by mouth daily., Disp: ,  Rfl:  .  ketoconazole (NIZORAL) 2 % cream, Apply topically., Disp: , Rfl:  .  Olopatadine HCl 0.2 % SOLN, Apply 1 drop to eye daily as needed (itchy/watery eyes). Do not put directly over contacts. Wait 15 minutes., Disp: 2.5 mL, Rfl: 5 .  ondansetron (ZOFRAN-ODT) 4 MG disintegrating tablet, Take 1 tablet (4 mg total) by mouth every 8 (eight) hours as needed for nausea or vomiting., Disp: 20 tablet, Rfl: 0 .  rizatriptan (MAXALT-MLT) 10 MG disintegrating tablet, Take 10 mg by mouth as directed. TAKE 1 TAB AT HEADACHE ONSET MAY TAKE A 2ND DOSE AFTER 2 HOURS IF NEEDED NO MORE THAN 2 DOSES/24 HRS, Disp: , Rfl: 3 .  Sod Picosulfate-Mag Ox-Cit Acd (CLENPIQ) 10-3.5-12 MG-GM -GM/160ML SOLN, Take 320 mLs by mouth once for 1 dose., Disp: 320 mL, Rfl: 0 .  venlafaxine XR (EFFEXOR-XR) 37.5 MG 24 hr capsule, Take 37.5 mg by mouth daily., Disp: , Rfl:  .  Vitamin D, Ergocalciferol, (DRISDOL) 1.25 MG (50000 UNIT) CAPS capsule, Take 1 capsule (50,000 Units total) by mouth every 7 (seven) days., Disp: 30 capsule, Rfl: 1   Family History  Problem Relation Age of Onset  . Diabetes Mellitus II Father   . Cancer Father   . Colon cancer Father   . Cancer Paternal Grandfather   . Lung cancer Paternal Grandfather      Social History   Tobacco Use  . Smoking status: Never Smoker  . Smokeless tobacco: Never Used  Vaping Use  . Vaping Use: Never used  Substance Use Topics  . Alcohol use: Yes    Alcohol/week: 0.0 standard drinks    Comment: occas  . Drug use: No    Allergies as of 02/24/2020 - Review Complete 02/24/2020  Allergen Reaction Noted  . Doxycycline Nausea And Vomiting 10/28/2019  . Amoxicillin-pot clavulanate Rash and Other (See Comments) 01/06/2015    Review of Systems:    All systems reviewed and negative except where noted in HPI.   Physical Exam:  BP 137/69 (BP Location: Left Arm, Patient Position: Sitting, Cuff Size: Normal)   Pulse 89   Temp 98 F (36.7 C) (Oral)   Ht 5\' 4"   (1.626 m)   Wt 154 lb 8 oz (70.1 kg)   BMI 26.52 kg/m  No LMP recorded.  General:   Alert,  Well-developed, well-nourished, pleasant and cooperative in NAD Head:  Normocephalic and atraumatic. Eyes:  Sclera clear, no icterus.   Conjunctiva pink. Ears:  Normal auditory acuity. Nose:  No deformity, discharge, or lesions. Mouth:  No deformity or lesions,oropharynx pink & moist. Neck:  Supple; no masses or thyromegaly. Lungs:  Respirations even and unlabored.  Clear throughout to auscultation.   No wheezes, crackles, or rhonchi. No acute distress. Heart:  Regular rate and rhythm; no murmurs, clicks, rubs, or gallops. Abdomen:  Normal bowel sounds. Soft, non-tender  and mildly distended, tympanic to palpation without masses, hepatosplenomegaly or hernias noted.  No guarding or rebound tenderness.   Rectal: Not performed Msk:  Symmetrical without gross deformities. Good, equal movement & strength bilaterally. Pulses:  Normal pulses noted. Extremities:  No clubbing or edema.  No cyanosis. Neurologic:  Alert and oriented x3;  grossly normal neurologically. Skin:  Intact without significant lesions or rashes. No jaundice. Lymph Nodes:  No significant cervical adenopathy. Psych:  Alert and cooperative. Normal mood and affect.  Imaging Studies: No abdominal imaging  Assessment and Plan:   Angela Rocha is a 25 y.o. female with history of appendectomy, history of recurrent sinusitis is seen in consultation for chronic diarrhea with abdominal bloating and cramps.  CBC, CMP, TSH are unremarkable.  HIV negative.  IgG, IgM levels normal, food allergy profile, alpha gal panel negative  Recommend GI profile PCR to rule out infection Recommend H. pylori breath test, celiac panel Recommend colonoscopy with TI evaluation after above work-up, given family history of early onset of colon cancer Trial of Bentyl 10 mg before each meal and bedtime as needed   Follow up in 6 to 8 weeks   Cephas Darby, MD

## 2020-02-26 LAB — CELIAC DISEASE PANEL
Endomysial IgA: NEGATIVE
IgA/Immunoglobulin A, Serum: 168 mg/dL (ref 87–352)
Transglutaminase IgA: <2 U/mL (ref 0–3)

## 2020-02-26 LAB — H. PYLORI BREATH TEST: H pylori Breath Test: NEGATIVE

## 2020-02-27 ENCOUNTER — Encounter: Payer: Self-pay | Admitting: Gastroenterology

## 2020-02-27 LAB — FUNGAL ID BY MOLECULAR METHODS

## 2020-02-27 LAB — FUNGUS CULTURE W/RFX RAPID ID: Fungal Culture W/Rfx: POSITIVE — AB

## 2020-03-04 ENCOUNTER — Other Ambulatory Visit: Payer: Self-pay

## 2020-03-04 ENCOUNTER — Other Ambulatory Visit: Payer: Self-pay | Admitting: Gastroenterology

## 2020-03-04 ENCOUNTER — Ambulatory Visit: Payer: BC Managed Care – PPO | Admitting: Dermatology

## 2020-03-04 DIAGNOSIS — D225 Melanocytic nevi of trunk: Secondary | ICD-10-CM

## 2020-03-04 DIAGNOSIS — D485 Neoplasm of uncertain behavior of skin: Secondary | ICD-10-CM

## 2020-03-04 DIAGNOSIS — Z86018 Personal history of other benign neoplasm: Secondary | ICD-10-CM

## 2020-03-04 HISTORY — DX: Personal history of other benign neoplasm: Z86.018

## 2020-03-04 MED ORDER — MUPIROCIN 2 % EX OINT: 1.0000 "application " | TOPICAL_OINTMENT | Freq: Every day | CUTANEOUS | 1 refills | Status: DC

## 2020-03-04 NOTE — Patient Instructions (Signed)

## 2020-03-04 NOTE — Progress Notes (Signed)
Follow-Up Visit   Subjective  Angela Rocha is a 25 y.o. female who presents for the following: Procedure (Patient here today for excision of severe dysplastic nevus at right upper back. ).  The following portions of the chart were reviewed this encounter and updated as appropriate:   Tobacco  Allergies  Meds  Problems  Med Hx  Surg Hx  Fam Hx      Review of Systems:  No other skin or systemic complaints except as noted in HPI or Assessment and Plan.  Objective  Well appearing patient in no apparent distress; mood and affect are within normal limits.  A focused examination was performed including back. Relevant physical exam findings are noted in the Assessment and Plan.  Objective  Right Upper Back: Healing bx site   Assessment & Plan  Neoplasm of uncertain behavior of skin Right Upper Back  Skin excision  Lesion length (cm):  0.9 Lesion width (cm):  0.9 Margin per side (cm):  0.5 Total excision diameter (cm):  1.9 Informed consent: discussed and consent obtained   Timeout: patient name, date of birth, surgical site, and procedure verified   Procedure prep:  Patient was prepped and draped in usual sterile fashion Prep type:  Chlorhexidine Anesthesia: the lesion was anesthetized in a standard fashion   Anesthesia comment:  16cc Anesthetic:  1% lidocaine w/ epinephrine 1-100,000 buffered w/ 8.4% NaHCO3 Instrument used: #15 blade   Hemostasis achieved with: suture, pressure and electrodesiccation   Outcome: patient tolerated procedure well with no complications   Post-procedure details: wound care instructions given   Additional details:  Mupirocin and a pressure dressing applied  Skin repair Complexity:  Complex Final length (cm):  6.1 Informed consent: discussed and consent obtained   Timeout: patient name, date of birth, surgical site, and procedure verified   Procedure prep:  Patient was prepped and draped in usual sterile fashion Prep type:   Chlorhexidine Anesthesia: the lesion was anesthetized in a standard fashion   Anesthetic:  1% lidocaine w/ epinephrine 1-100,000 local infiltration Reason for type of repair: reduce tension to allow closure, reduce the risk of dehiscence, infection, and necrosis, reduce subcutaneous dead space and avoid a hematoma, allow closure of the large defect, allow side-to-side closure without requiring a flap or graft and enhance both functionality and cosmetic results   Undermining: area extensively undermined   Subcutaneous layers (deep stitches):  Suture size:  2-0 Suture type: Vicryl (polyglactin 910)   Stitches:  Buried vertical mattress Fine/surface layer approximation (top stitches):  Suture size:  4-0 Suture type: nylon   Suture removal (days):  7 Hemostasis achieved with: pressure and electrodesiccation Outcome: patient tolerated procedure well with no complications   Post-procedure details: wound care instructions given   Additional details:  Extensive undermining greater than the maximum width of the defect along at least one entire edge of the defect was performed Maximum width of defect perpendicular to the line of the closure 1.9 cm Width of undermining done 2.2 cm  Mupirocin and a pressure bandage applied   Specimen 1 - Surgical pathology Differential Diagnosis: Bx proven DYSPLASTIC NEVUS WITH MODERATE TO SEVERE ATYPIA  Check Margins: yes Healing bx site [id number]  Ordered Medications: mupirocin ointment (BACTROBAN) 2 %  Return in about 1 week (around 03/11/2020) for Suture Removal.  Graciella Belton, RMA, am acting as scribe for Forest Gleason, MD .  Documentation: I have reviewed the above documentation for accuracy and completeness, and I agree with the above.  Forest Gleason, MD

## 2020-03-05 ENCOUNTER — Telehealth: Payer: Self-pay

## 2020-03-05 NOTE — Telephone Encounter (Signed)
Patient doing fine after yesterday's surgery. She is having a little discomfort but taking Tylenol and ibuprofen per instructions. Patient would like drs note sent for yesterday and today sent thru my chart, JS

## 2020-03-08 LAB — GI PROFILE, STOOL, PCR

## 2020-03-09 ENCOUNTER — Encounter: Payer: Self-pay | Admitting: Dermatology

## 2020-03-11 ENCOUNTER — Other Ambulatory Visit: Payer: Self-pay

## 2020-03-11 ENCOUNTER — Telehealth: Payer: Self-pay | Admitting: Dermatology

## 2020-03-11 ENCOUNTER — Telehealth: Payer: Self-pay

## 2020-03-11 ENCOUNTER — Ambulatory Visit (INDEPENDENT_AMBULATORY_CARE_PROVIDER_SITE_OTHER): Payer: BC Managed Care – PPO | Admitting: Dermatology

## 2020-03-11 DIAGNOSIS — B351 Tinea unguium: Secondary | ICD-10-CM | POA: Diagnosis not present

## 2020-03-11 DIAGNOSIS — B078 Other viral warts: Secondary | ICD-10-CM

## 2020-03-11 DIAGNOSIS — B353 Tinea pedis: Secondary | ICD-10-CM

## 2020-03-11 MED ORDER — TERBINAFINE HCL 250 MG PO TABS: ORAL_TABLET | ORAL | 0 refills | Status: DC

## 2020-03-11 NOTE — Progress Notes (Signed)
Follow-Up Visit   Subjective  Angela Rocha is a 25 y.o. female who presents for the following: Follow-up (Patient here today for suture removal, wart treatment, and follow-up onychomycosis).  Original pathology showed DYSPLASTIC NEVUS WITH MODERATE TO SEVERE ATYPIA, excised 03/04/2020 with clear margins  The following portions of the chart were reviewed this encounter and updated as appropriate:   Tobacco  Allergies  Meds  Problems  Med Hx  Surg Hx  Fam Hx      Review of Systems:  No other skin or systemic complaints except as noted in HPI or Assessment and Plan.  Objective  Well appearing patient in no apparent distress; mood and affect are within normal limits.  A focused examination was performed including back. Relevant physical exam findings are noted in the Assessment and Plan.  Objective  Right 5th finger: Verrucous papules  Objective  Left Foot - Anterior: Toenails thickened with subungual debris   Assessment & Plan  Other viral warts Right 5th finger  Squaric Acid 3% applied followed by Cantharidin Plus  Cantharidin is a blistering agent that comes from a beetle.  It needs to be washed off in about 4 hours after application.  Although it is painless when applied in office, it may cause symptoms of mild pain and burning several hours later.  Treated areas will swell and turn red, and blisters may form.  Vaseline and a bandaid may be applied until wound has healed.  Once healed, the skin may remain temporarily discolored.  It can take weeks to months for pigmentation to return to normal.    Discussed viral etiology and risk of spread.  Discussed multiple treatments may be required to clear warts.  Discussed possible post-treatment dyspigmentation and risk of recurrence.   Destruction of lesion - Right 5th finger  Destruction method: chemical removal   Destruction method comment:  Cantharidin plus Informed consent: discussed and consent obtained   Timeout:   patient name, date of birth, surgical site, and procedure verified Chemical destruction method: cantharidin   Application time:  4 hours Procedure instructions: patient instructed to wash and dry area   Outcome: patient tolerated procedure well with no complications   Post-procedure details: wound care instructions given    Destruction of lesion - Right 5th finger  Destruction method: chemical removal   Informed consent: discussed and consent obtained   Timeout:  patient name, date of birth, surgical site, and procedure verified Chemical destruction method comment:  Squaric Acid 3% Procedure instructions: patient instructed to wash and dry area   Outcome: patient tolerated procedure well with no complications   Post-procedure details: wound care instructions given    Onychomycosis Left Foot - Anterior  Chronic condition with duration of decades. Condition is bothersome to patient. Currently flared.  Culture grew trichophyton rubrum  Recommend pulse dose terbinafine to be taken for 7 days every other month for 7 months (Take for 7 days at the beginnings of months 1, 3, 5, and 7; dispense 28 tablets).    Terbinafine is an anti-fungal medicine that can be applied to the skin (over the counter) or taken by mouth (prescription) to treat fungal infections. The pill version is often used to treat fungal infections of the nails or scalp. While most people do not have any side effects from taking terbinafine pills, some possible side effects of the medicine can include taste changes, headache, loss of smell, vision changes, nausea, vomiting, or diarrhea.    Rare side effects can include  irritation of the liver, allergic reaction, or decrease in blood counts (which may show up as not feeling well or developing an infection). If you are concerned about any of these side effects, please stop the medicine and call your doctor, or in the case of an emergency such as feeling very unwell, seek immediate  medical care.       Encounter for Removal of Sutures - Incision site at the right upper back is clean, dry and intact - Wound cleansed, sutures removed, wound cleansed and steri strips applied.  - Discussed pathology results showing margins clear  - Patient advised to keep steri-strips dry until they fall off. - Scars remodel for a full year. - Once steri-strips fall off, patient can apply over-the-counter silicone scar cream each night to help with scar remodeling if desired. - Patient advised to call with any concerns or if they notice any new or changing lesions.   No follow-ups on file.  Graciella Belton, RMA, am acting as scribe for Forest Gleason, MD .  Documentation: I have reviewed the above documentation for accuracy and completeness, and I agree with the above.  Forest Gleason, MD

## 2020-03-11 NOTE — Telephone Encounter (Signed)
Called patient to remind her to wash off cantharidin 4 hours after application. Also advised her we are sending in pulse dose terbinafine per below -  "Please call patient to review and send in prescription for terbinafine as below. Please also remind patient to wash off cantharone plus 4 hours after her visit or sooner if tender before then. Thank you!  Recommend pulse dose terbinafine to be taken for 7 days every other month for 7 months (Take for 7 days at the beginnings of months 1, 3, 5, and 7; dispense 28 tablets).  Terbinafine is an anti-fungal medicine that can be applied to the skin (over the counter) or taken by mouth (prescription) to treat fungal infections. The pill version is often used to treat fungal infections of the nails or scalp. While most people do not have any side effects from taking terbinafine pills, some possible side effects of the medicine can include taste changes, headache, loss of smell, vision changes, nausea, vomiting, or diarrhea.   Rare side effects can include irritation of the liver, allergic reaction, or decrease in blood counts (which may show up as not feeling well or developing an infection). If you are concerned about any of these side effects, please stop the medicine and call your doctor, or in the case of an emergency such as feeling very unwell, seek immediate medical care."

## 2020-03-11 NOTE — Telephone Encounter (Signed)
Please call patient to review and send in prescription for terbinafine as below. Please also remind patient to wash off cantharone plus 4 hours after her visit or sooner if tender before then. Thank you!  Recommend pulse dose terbinafine to be taken for 7 days every other month for 7 months (Take for 7 days at the beginnings of months 1, 3, 5, and 7; dispense 28 tablets).    Terbinafine is an anti-fungal medicine that can be applied to the skin (over the counter) or taken by mouth (prescription) to treat fungal infections. The pill version is often used to treat fungal infections of the nails or scalp. While most people do not have any side effects from taking terbinafine pills, some possible side effects of the medicine can include taste changes, headache, loss of smell, vision changes, nausea, vomiting, or diarrhea.    Rare side effects can include irritation of the liver, allergic reaction, or decrease in blood counts (which may show up as not feeling well or developing an infection). If you are concerned about any of these side effects, please stop the medicine and call your doctor, or in the case of an emergency such as feeling very unwell, seek immediate medical care.

## 2020-03-11 NOTE — Patient Instructions (Addendum)
Recommend Serica moisturizing scar formula cream every night or [facility] brand or Mederma silicone scar sheet every night for the first year after a scar appears to help with scar remodeling if desired. Scars remodel on their own for a full year.   Instructions for After In-Office Application of Cantharidin  1. This is a strong medicine; please follow ALL instructions.  2. Gently wash off with soap and water in four hours or sooner s directed by your physician.  3. **WARNING** this medicine can cause severe blistering, blood blisters, infection, and/or scarring if it is not washed off as directed.  4. Your progress will be rechecked in 1-2 months; call sooner if there are any questions or problems.   Cantharidin is a blistering agent that comes from a beetle.  It needs to be washed off in about 4 hours after application.  Although it is painless when applied in office, it may cause symptoms of mild pain and burning several hours later.  Treated areas will swell and turn red, and blisters may form.  Vaseline and a bandaid may be applied until wound has healed.  Once healed, the skin may remain temporarily discolored.  It can take weeks to months for pigmentation to return to normal.

## 2020-03-17 ENCOUNTER — Encounter: Payer: Self-pay | Admitting: Dermatology

## 2020-03-18 ENCOUNTER — Telehealth: Payer: Self-pay | Admitting: Gastroenterology

## 2020-03-18 MED ORDER — CLENPIQ 10-3.5-12 MG-GM -GM/160ML PO SOLN: 320.0000 mL | Freq: Once | ORAL | 0 refills | Status: AC

## 2020-03-18 NOTE — Telephone Encounter (Signed)
Patient called asking for call back with questions about her prep.

## 2020-03-18 NOTE — Telephone Encounter (Signed)
Patient wanted to make sure that small containers got called in for her for the prep. Informed patient it was it was 2 small containers. She states she wants it resent to [facility]

## 2020-03-19 ENCOUNTER — Other Ambulatory Visit: Admission: RE | Admit: 2020-03-19 | Payer: BC Managed Care – PPO | Source: Ambulatory Visit

## 2020-03-21 ENCOUNTER — Other Ambulatory Visit: Payer: Self-pay | Admitting: Gastroenterology

## 2020-03-21 DIAGNOSIS — K529 Noninfective gastroenteritis and colitis, unspecified: Secondary | ICD-10-CM

## 2020-03-23 ENCOUNTER — Encounter
Admission: RE | Disposition: A | Discharge status: Home / Self Care | Payer: Self-pay | Source: Home / Self Care | Attending: Gastroenterology

## 2020-03-23 ENCOUNTER — Ambulatory Visit
Admission: RE | Admit: 2020-03-23 | Discharge: 2020-03-23 | Disposition: A | Discharge status: Home / Self Care | Payer: BC Managed Care – PPO | Attending: Gastroenterology | Admitting: Gastroenterology

## 2020-03-23 ENCOUNTER — Ambulatory Visit: Payer: BC Managed Care – PPO | Admitting: Anesthesiology

## 2020-03-23 ENCOUNTER — Encounter: Payer: Self-pay | Admitting: Gastroenterology

## 2020-03-23 DIAGNOSIS — T85848A Pain due to other internal prosthetic devices, implants and grafts, initial encounter: Secondary | ICD-10-CM | POA: Insufficient documentation

## 2020-03-23 DIAGNOSIS — Z800 Family history of malignant neoplasm of digestive organs: Secondary | ICD-10-CM | POA: Insufficient documentation

## 2020-03-23 DIAGNOSIS — K529 Noninfective gastroenteritis and colitis, unspecified: Secondary | ICD-10-CM | POA: Insufficient documentation

## 2020-03-23 DIAGNOSIS — Z79899 Other long term (current) drug therapy: Secondary | ICD-10-CM | POA: Insufficient documentation

## 2020-03-23 DIAGNOSIS — Z1211 Encounter for screening for malignant neoplasm of colon: Secondary | ICD-10-CM | POA: Diagnosis present

## 2020-03-23 DIAGNOSIS — Z85038 Personal history of other malignant neoplasm of large intestine: Secondary | ICD-10-CM | POA: Diagnosis not present

## 2020-03-23 DIAGNOSIS — Z7951 Long term (current) use of inhaled steroids: Secondary | ICD-10-CM | POA: Insufficient documentation

## 2020-03-23 HISTORY — PX: COLONOSCOPY WITH PROPOFOL: SHX5780

## 2020-03-23 LAB — POCT PREGNANCY, URINE: Preg Test, Ur: NEGATIVE

## 2020-03-23 SURGERY — COLONOSCOPY WITH PROPOFOL
Anesthesia: General

## 2020-03-23 MED ORDER — PROPOFOL 500 MG/50ML IV EMUL
INTRAVENOUS | Status: DC | PRN
  Administered 2020-03-23: 125 ug/kg/min via INTRAVENOUS

## 2020-03-23 MED ORDER — SODIUM CHLORIDE 0.9 % IV SOLN: INTRAVENOUS | Status: DC

## 2020-03-23 MED ORDER — PROPOFOL 10 MG/ML IV BOLUS
INTRAVENOUS | Status: DC | PRN
  Administered 2020-03-23: 70 mg via INTRAVENOUS
  Administered 2020-03-23: 30 mg via INTRAVENOUS

## 2020-03-23 MED ORDER — MIDAZOLAM HCL 2 MG/2ML IJ SOLN
INTRAMUSCULAR | Status: AC
  Filled 2020-03-23: qty 2

## 2020-03-23 MED ORDER — LIDOCAINE HCL (CARDIAC) PF 100 MG/5ML IV SOSY
PREFILLED_SYRINGE | INTRAVENOUS | Status: DC | PRN
  Administered 2020-03-23: 50 mg via INTRAVENOUS

## 2020-03-23 MED ORDER — MIDAZOLAM HCL 2 MG/2ML IJ SOLN
INTRAMUSCULAR | Status: DC | PRN
  Administered 2020-03-23: 2 mg via INTRAVENOUS

## 2020-03-23 NOTE — H&P (Signed)
Cephas Darby, MD [address]  [place]  [place],  [address]  Main: [phone]  Fax: [phone] Pager: [phone]  Primary Care Physician:  Wayland Denis, PA-C Primary Gastroenterologist:  Dr. Cephas Darby  Pre-Procedure History & Physical: HPI:  Angela Rocha is a 25 y.o. female is here for an colonoscopy.   Past Medical History:  Diagnosis Date  . ADHD   . Anxiety   . Asthma   . History of dysplastic nevus 03/04/2020   right upper back, severe/excision  . Migraine   . Pelvic congestion syndrome   . Recurrent upper respiratory infection (URI)   . Urticaria     Past Surgical History:  Procedure Laterality Date  . ADENOIDECTOMY    . HIP SURGERY    . TONSILLECTOMY    . TYMPANOSTOMY TUBE PLACEMENT      Prior to Admission medications   Medication Sig Start Date End Date Taking? Authorizing Provider  busPIRone (BUSPAR) 7.5 MG tablet Take 7.5 mg by mouth 2 (two) times daily. 10/20/19  Yes [provider]  cetirizine (ZYRTEC) 10 MG tablet Take 10 mg by mouth daily. 04/17/17  Yes [provider]  Fluticasone-Umeclidin-Vilant (TRELEGY ELLIPTA) 200-62.5-25 MCG/INH AEPB Inhale 1 puff into the lungs daily. Rinse mouth after each use. 02/12/20  Yes Garnet Sierras, DO  gabapentin (NEURONTIN) 100 MG capsule Take by mouth. 01/16/20  Yes [provider]  hydrOXYzine (ATARAX/VISTARIL) 25 MG tablet Take 25 mg by mouth daily. 12/25/19  Yes [provider]  venlafaxine XR (EFFEXOR-XR) 37.5 MG 24 hr capsule Take 37.5 mg by mouth daily. 12/24/19  Yes [provider]  albuterol (VENTOLIN HFA) 108 (90 Base) MCG/ACT inhaler Inhale 2 puffs into the lungs every 6 (six) hours as needed for wheezing or shortness of breath. 01/08/20   Lauraine Rinne, NP  amphetamine-dextroamphetamine (ADDERALL XR) 20 MG 24 hr capsule Take by mouth. 01/31/20 03/01/20  [provider]  budesonide-formoterol (SYMBICORT) 160-4.5 MCG/ACT inhaler Inhale 2  puffs into the lungs 2 (two) times daily. 01/08/20   Lauraine Rinne, NP  ciclopirox (LOPROX) 0.77 % cream Apply topically 2 (two) times daily. To feet for 3-4 weeks then once weekly 02/06/20   Moye, [place], MD  cyanocobalamin (,VITAMIN B-12,) 1000 MCG/ML injection INJECT 1 ML (1,000 MCG TOTAL) INTO THE MUSCLE EVERY 30 (THIRTY) DAYS. 12/11/17   Shambley, Melody N, CNM  dexmethylphenidate (FOCALIN) 2.5 MG tablet Take 2.5 mg by mouth daily. 01/30/17   [provider]  dicyclomine (BENTYL) 10 MG capsule TAKE 1 CAPSULE (10 MG TOTAL) BY MOUTH 3 (THREE) TIMES DAILY WITH MEALS AS NEEDED FOR SPASMS. 03/23/20 04/22/20  Lin Landsman, MD  EPINEPHrine (AUVI-Q) 0.3 mg/0.3 mL IJ SOAJ injection Inject 0.3 mg into the muscle as needed for anaphylaxis. 02/12/20   Garnet Sierras, DO  fluticasone (FLONASE) 50 MCG/ACT nasal spray Place 1 spray into both nostrils 2 (two) times daily as needed for allergies. 02/12/20   Garnet Sierras, DO  Galcanezumab-gnlm [facility]) 120 MG/ML SOAJ Inject into the skin. 01/20/20   [provider]  ketoconazole (NIZORAL) 2 % cream Apply topically. 11/24/19   [provider]  mupirocin ointment (BACTROBAN) 2 % Apply 1 application topically daily. 03/04/20   Moye, [place], MD  Olopatadine HCl 0.2 % SOLN Apply 1 drop to eye daily as needed (itchy/watery eyes). Do not put directly over contacts. Wait 15 minutes. 02/12/20   Garnet Sierras, DO  ondansetron (ZOFRAN-ODT) 4 MG disintegrating  tablet Take 1 tablet (4 mg total) by mouth every 8 (eight) hours as needed for nausea or vomiting. 10/30/19   Cuthriell, Charline Bills, PA-C  rizatriptan (MAXALT-MLT) 10 MG disintegrating tablet Take 10 mg by mouth as directed. TAKE 1 TAB AT HEADACHE ONSET MAY TAKE A 2ND DOSE AFTER 2 HOURS IF NEEDED NO MORE THAN 2 DOSES/24 HRS 04/25/17   [provider]  terbinafine (LAMISIL) 250 MG tablet Take 1 by mouth as directed. 03/11/20   Moye, [place], MD  Vitamin D, Ergocalciferol, (DRISDOL) 1.25 MG  (50000 UNIT) CAPS capsule Take 1 capsule (50,000 Units total) by mouth every 7 (seven) days. 12/31/19   Philip Aspen, CNM    Allergies as of 02/24/2020 - Review Complete 02/24/2020  Allergen Reaction Noted  . Doxycycline Nausea And Vomiting 10/28/2019  . Amoxicillin-pot clavulanate Rash and Other (See Comments) 01/06/2015    Family History  Problem Relation Age of Onset  . Diabetes Mellitus II Father   . Cancer Father   . Colon cancer Father   . Cancer Paternal Grandfather   . Lung cancer Paternal Grandfather     Social History   Socioeconomic History  . Marital status: Single    Spouse name: Not on file  . Number of children: Not on file  . Years of education: Not on file  . Highest education level: Not on file  Occupational History  . Not on file  Tobacco Use  . Smoking status: Never Smoker  . Smokeless tobacco: Never Used  Vaping Use  . Vaping Use: Never used  Substance and Sexual Activity  . Alcohol use: Yes    Alcohol/week: 0.0 standard drinks    Comment: occas  . Drug use: No  . Sexual activity: Not Currently    Birth control/protection: Pill  Other Topics Concern  . Not on file  Social History Narrative  . Not on file   Social Determinants of Health   Financial Resource Strain: Not on file  Food Insecurity: Not on file  Transportation Needs: Not on file  Physical Activity: Not on file  Stress: Not on file  Social Connections: Not on file  Intimate Partner Violence: Not on file    Review of Systems: See HPI, otherwise negative ROS  Physical Exam: BP (!) 142/77   Pulse 86   Temp (!) 97.1 F (36.2 C) (Temporal)   Resp 18   Ht 5\' 3"  (1.6 m)   Wt 67.6 kg   SpO2 100%   BMI 26.39 kg/m  General:   Alert,  pleasant and cooperative in NAD Head:  Normocephalic and atraumatic. Neck:  Supple; no masses or thyromegaly. Lungs:  Clear throughout to auscultation.    Heart:  Regular rate and rhythm. Abdomen:  Soft, nontender and nondistended. Normal  bowel sounds, without guarding, and without rebound.   Neurologic:  Alert and  oriented x4;  grossly normal neurologically.  Impression/Plan: Angela Rocha is here for an colonoscopy to be performed for chronic diarrhea and family h/o colon cancer  Risks, benefits, limitations, and alternatives regarding  colonoscopy have been reviewed with the patient.  Questions have been answered.  All parties agreeable.   Sherri Sear, MD  03/23/2020, 10:25 AM

## 2020-03-23 NOTE — Transfer of Care (Signed)
Immediate Anesthesia Transfer of Care Note  Patient: Angela Rocha  Procedure(s) Performed: COLONOSCOPY WITH PROPOFOL (N/A )  Patient Location: PACU and Endoscopy Unit  Anesthesia Type:General  Level of Consciousness: drowsy  Airway & Oxygen Therapy: Patient Spontanous Breathing  Post-op Assessment: Report given to RN and Post -op Vital signs reviewed and stable  Post vital signs: Reviewed and stable  Last Vitals:  Vitals Value Taken Time  BP 104/68 03/23/20 1130  Temp    Pulse 82 03/23/20 1131  Resp 20 03/23/20 1131  SpO2 100 % 03/23/20 1131  Vitals shown include unvalidated device data.  Last Pain:  Vitals:   03/23/20 1023  TempSrc: Temporal  PainSc: 0-No pain         Complications: No complications documented.

## 2020-03-23 NOTE — Op Note (Signed)
[facility] Gastroenterology Patient Name: Angela Rocha Procedure Date: 03/23/2020 10:56 AM MRN: [id number] Account #: [id number] Date of Birth: July 09, 1995 Admit Type: Outpatient Age: 25 Room: [facility] ENDO ROOM 1 Gender: Female Note Status: Finalized Procedure:             Colonoscopy Indications:           Screening patient at increased risk: Family history of                         1st-degree relative with colorectal cancer at age 66                         years (or older), Colon cancer screening in patient at                         increased risk: Family history of colorectal cancer in                         multiple 2nd degree relatives Providers:             Lin Landsman MD, MD Medicines:             General Anesthesia Complications:         No immediate complications. Estimated blood loss: None. Procedure:             Pre-Anesthesia Assessment:                        - Prior to the procedure, a History and Physical was                         performed, and patient medications and allergies were                         reviewed. The patient is competent. The risks and                         benefits of the procedure and the sedation options and                         risks were discussed with the patient. All questions                         were answered and informed consent was obtained.                         Patient identification and proposed procedure were                         verified by the physician, the nurse, the                         anesthesiologist, the anesthetist and the technician                         in the pre-procedure area. Mental Status Examination:                         alert and  oriented. Airway Examination: normal                         oropharyngeal airway and neck mobility. Respiratory                         Examination: clear to auscultation. CV Examination:                         normal. Prophylactic  Antibiotics: The patient does not                         require prophylactic antibiotics. Prior                         Anticoagulants: The patient has taken no previous                         anticoagulant or antiplatelet agents. ASA Grade                         Assessment: III - A patient with severe systemic                         disease. After reviewing the risks and benefits, the                         patient was deemed in satisfactory condition to                         undergo the procedure. The anesthesia plan was to use                         general anesthesia. Immediately prior to                         administration of medications, the patient was                         re-assessed for adequacy to receive sedatives. The                         heart rate, respiratory rate, oxygen saturations,                         blood pressure, adequacy of pulmonary ventilation, and                         response to care were monitored throughout the                         procedure. The physical status of the patient was                         re-assessed after the procedure.                        After obtaining informed consent, the colonoscope was  passed under direct vision. Throughout the procedure,                         the patient's blood pressure, pulse, and oxygen                         saturations were monitored continuously. The                         Colonoscope was introduced through the anus and                         advanced to the the terminal ileum, with                         identification of the appendiceal orifice and IC                         valve. The colonoscopy was performed without                         difficulty. The patient tolerated the procedure well.                         The quality of the bowel preparation was evaluated                         using the BBPS [facility] Bowel Preparation Scale) with                          scores of: Right Colon = 3, Transverse Colon = 3 and                         Left Colon = 3 (entire mucosa seen well with no                         residual staining, small fragments of stool or opaque                         liquid). The total BBPS score equals 9. Findings:      The perianal and digital rectal examinations were normal. Pertinent       negatives include normal sphincter tone and no palpable rectal lesions.      The terminal ileum appeared normal.      The entire examined colon appeared normal.      The retroflexed view of the distal rectum and anal verge was normal and       showed no anal or rectal abnormalities. Impression:            - The examined portion of the ileum was normal.                        - The entire examined colon is normal.                        - The distal rectum and anal verge are normal on  retroflexion view.                        - No specimens collected. Recommendation:        - Discharge patient to home (with escort).                        - Resume previous diet today.                        - Continue present medications.                        - Repeat colonoscopy at age 11 for screening purposes. Procedure Code(s):     --- Professional ---                        H5456, Colorectal cancer screening; colonoscopy on                         individual at high risk Diagnosis Code(s):     --- Professional ---                        Z80.0, Family history of malignant neoplasm of                         digestive organs CPT copyright 2019 American Medical Association. All rights reserved. The codes documented in this report are preliminary and upon coder review may  be revised to meet current compliance requirements. Dr. Ulyess Mort Lin Landsman MD, MD 03/23/2020 11:28:54 AM This report has been signed electronically. Number of Addenda: 0 Note Initiated On: 03/23/2020 10:56 AM Scope Withdrawal  Time: 0 hours 12 minutes 1 second  Total Procedure Duration: 0 hours 16 minutes 36 seconds  Estimated Blood Loss:  Estimated blood loss: none.      [facility]

## 2020-03-23 NOTE — Anesthesia Preprocedure Evaluation (Signed)
Anesthesia Evaluation  Patient identified by MRN, date of birth, ID band Patient awake    Reviewed: Allergy & Precautions, H&P , NPO status , Patient's Chart, lab work & pertinent test results  History of Anesthesia Complications Negative for: history of anesthetic complications  Airway Mallampati: II  TM Distance: >3 FB Neck ROM: full    Dental  (+) Chipped   Pulmonary asthma ,    Pulmonary exam normal        Cardiovascular Exercise Tolerance: Good (-) angina(-) Past MI negative cardio ROS Normal cardiovascular exam     Neuro/Psych  Headaches, PSYCHIATRIC DISORDERS    GI/Hepatic negative GI ROS, Neg liver ROS, neg GERD  ,  Endo/Other  negative endocrine ROS  Renal/GU negative Renal ROS  negative genitourinary   Musculoskeletal   Abdominal   Peds  Hematology negative hematology ROS (+)   Anesthesia Other Findings Past Medical History: No date: ADHD No date: Anxiety No date: Asthma 03/04/2020: History of dysplastic nevus     Comment:  right upper back, severe/excision No date: Migraine No date: Pelvic congestion syndrome No date: Recurrent upper respiratory infection (URI) No date: Urticaria  Past Surgical History: No date: ADENOIDECTOMY No date: HIP SURGERY No date: TONSILLECTOMY No date: TYMPANOSTOMY TUBE PLACEMENT  BMI    Body Mass Index: 26.39 kg/m      Reproductive/Obstetrics negative OB ROS                             Anesthesia Physical Anesthesia Plan  ASA: III  Anesthesia Plan: General   Post-op Pain Management:    Induction: Intravenous  PONV Risk Score and Plan: Propofol infusion and TIVA  Airway Management Planned: Natural Airway and Nasal Cannula  Additional Equipment:   Intra-op Plan:   Post-operative Plan:   Informed Consent: I have reviewed the patients History and Physical, chart, labs and discussed the procedure including the risks,  benefits and alternatives for the proposed anesthesia with the patient or authorized representative who has indicated his/her understanding and acceptance.     Dental Advisory Given  Plan Discussed with: Anesthesiologist, CRNA and Surgeon  Anesthesia Plan Comments: (Patient consented for risks of anesthesia including but not limited to:  - adverse reactions to medications - risk of airway placement if required - damage to eyes, teeth, lips or other oral mucosa - nerve damage due to positioning  - sore throat or hoarseness - Damage to heart, brain, nerves, lungs, other parts of body or loss of life  Patient voiced understanding.)        Anesthesia Quick Evaluation

## 2020-03-23 NOTE — Anesthesia Procedure Notes (Signed)
Procedure Name: MAC Date/Time: 03/23/2020 11:05 AM Performed by: Jerrye Noble, CRNA Pre-anesthesia Checklist: Patient identified, Emergency Drugs available, Suction available and Patient being monitored Patient Re-evaluated:Patient Re-evaluated prior to induction Oxygen Delivery Method: Nasal cannula

## 2020-03-23 NOTE — Anesthesia Postprocedure Evaluation (Signed)
Anesthesia Post Note  Patient: Angela Rocha  Procedure(s) Performed: COLONOSCOPY WITH PROPOFOL (N/A )  Patient location during evaluation: Endoscopy Anesthesia Type: General Level of consciousness: awake and alert Pain management: pain level controlled Vital Signs Assessment: post-procedure vital signs reviewed and stable Respiratory status: spontaneous breathing, nonlabored ventilation, respiratory function stable and patient connected to nasal cannula oxygen Cardiovascular status: blood pressure returned to baseline and stable Postop Assessment: no apparent nausea or vomiting Anesthetic complications: no   No complications documented.   Last Vitals:  Vitals:   03/23/20 1150 03/23/20 1200  BP: 126/76 107/76  Pulse:  72  Resp: 17 13  Temp:    SpO2:  100%    Last Pain:  Vitals:   03/23/20 1200  TempSrc:   PainSc: 0-No pain                 Precious Haws Maheen Cwikla

## 2020-03-24 ENCOUNTER — Encounter: Payer: Self-pay | Admitting: Gastroenterology

## 2020-04-11 ENCOUNTER — Other Ambulatory Visit: Payer: Self-pay | Admitting: Gastroenterology

## 2020-04-11 DIAGNOSIS — K529 Noninfective gastroenteritis and colitis, unspecified: Secondary | ICD-10-CM

## 2020-04-20 ENCOUNTER — Telehealth (INDEPENDENT_AMBULATORY_CARE_PROVIDER_SITE_OTHER): Payer: BC Managed Care – PPO | Admitting: Gastroenterology

## 2020-04-20 ENCOUNTER — Encounter: Payer: Self-pay | Admitting: Gastroenterology

## 2020-04-20 ENCOUNTER — Ambulatory Visit: Payer: BC Managed Care – PPO | Admitting: Allergy

## 2020-04-20 DIAGNOSIS — R109 Unspecified abdominal pain: Secondary | ICD-10-CM | POA: Diagnosis not present

## 2020-04-20 DIAGNOSIS — K529 Noninfective gastroenteritis and colitis, unspecified: Secondary | ICD-10-CM

## 2020-04-20 DIAGNOSIS — J309 Allergic rhinitis, unspecified: Secondary | ICD-10-CM

## 2020-04-20 DIAGNOSIS — R140 Abdominal distension (gaseous): Secondary | ICD-10-CM | POA: Diagnosis not present

## 2020-04-20 NOTE — Progress Notes (Signed)
Sherri Sear, MD [address]  [place]  [place], [place] [address]  Main: [phone]  Fax: [phone]    Gastroenterology Consultation Video Visit  Referring Provider:     Wayland Denis, PA-C Primary Care Physician:  Wayland Denis, PA-C Primary Gastroenterologist:  Dr. Cephas Darby Reason for Consultation:     Chronic diarrhea        HPI:   FAYNE MCGUFFEE is a 25 y.o. female referred by Dr. Wayland Denis, PA-C  for consultation & management of chronic diarrhea  Virtual Visit Video Note  I connected with JENAVIE STANCZAK on 04/20/20 at  1:45 PM EDT by video and verified that I am speaking with the correct person using two identifiers.   I discussed the limitations, risks, security and privacy concerns of performing an evaluation and management service by video and the availability of in person appointments. I also discussed with the patient that there may be a patient responsible charge related to this service. The patient expressed understanding and agreed to proceed.  Location of the Patient: Home  Location of the provider: Office  Persons participating in the visit: Patient and provider only   History of Present Illness: Maeby is here for follow-up of chronic diarrhea with abdominal bloating and cramps.  She underwent extensive work-up which has been unremarkable.  She has been taking Bentyl 10 mg 3-4 times daily with symptomatic relief for a short.  Only.  She continues to have diarrhea, abdominal bloating as well as cramps.  She is also gradually losing weight.  She underwent further investigation including stool studies which were negative, celiac panel was negative, H. pylori breath test was negative.  Colonoscopy with TI evaluation was also negative.  She does report good appetite.  She is currently being treated for anxiety.  She denies any significant stress    NSAIDs: None  Antiplts/Anticoagulants/Anti thrombotics: None  GI Procedures:  Colonoscopy 03/23/2020 Normal terminal ileum, normal colon, no biopsies were performed  Past Medical History:  Diagnosis Date  . ADHD   . Anxiety   . Asthma   . History of dysplastic nevus 03/04/2020   right upper back, severe/excision  . Migraine   . Pelvic congestion syndrome   . Recurrent upper respiratory infection (URI)   . Urticaria     Past Surgical History:  Procedure Laterality Date  . ADENOIDECTOMY    . COLONOSCOPY WITH PROPOFOL N/A 03/23/2020   Procedure: COLONOSCOPY WITH PROPOFOL;  Surgeon: Lin Landsman, MD;  Location: [facility] ENDOSCOPY;  Service: Gastroenterology;  Laterality: N/A;  . HIP SURGERY    . TONSILLECTOMY    . TYMPANOSTOMY TUBE PLACEMENT      Current Outpatient Medications:  .  albuterol (VENTOLIN HFA) 108 (90 Base) MCG/ACT inhaler, Inhale 2 puffs into the lungs every 6 (six) hours as needed for wheezing or shortness of breath., Disp: 1 each, Rfl: 4 .  amphetamine-dextroamphetamine (ADDERALL XR) 15 MG 24 hr capsule, Take by mouth., Disp: , Rfl:  .  budesonide-formoterol (SYMBICORT) 160-4.5 MCG/ACT inhaler, Inhale 2 puffs into the lungs 2 (two) times daily., Disp: 1 each, Rfl: 5 .  busPIRone (BUSPAR) 7.5 MG tablet, Take 7.5 mg by mouth 2 (two) times daily., Disp: , Rfl:  .  cetirizine (ZYRTEC) 10 MG tablet, Take 10 mg by mouth daily., Disp: , Rfl: 3 .  ciclopirox (LOPROX) 0.77 % cream, Apply topically 2 (two) times daily. To feet for 3-4 weeks then once weekly, Disp: 90 g, Rfl: 2 .  cyanocobalamin (,VITAMIN B-12,) 1000 MCG/ML injection, INJECT 1 ML (1,000 MCG TOTAL) INTO THE MUSCLE EVERY 30 (THIRTY) DAYS., Disp: 10 mL, Rfl: 1 .  dexmethylphenidate (FOCALIN) 2.5 MG tablet, Take 2.5 mg by mouth daily., Disp: , Rfl:  .  dicyclomine (BENTYL) 10 MG capsule, TAKE 1 CAPSULE (10 MG TOTAL) BY MOUTH 3 (THREE) TIMES DAILY WITH MEALS AS NEEDED FOR SPASMS., Disp: 270 capsule, Rfl: 0 .  EPINEPHrine (AUVI-Q) 0.3 mg/0.3 mL IJ SOAJ injection, Inject 0.3 mg into the muscle  as needed for anaphylaxis., Disp: 1 each, Rfl: 2 .  fluticasone (FLONASE) 50 MCG/ACT nasal spray, Place 1 spray into both nostrils 2 (two) times daily as needed for allergies., Disp: 16 g, Rfl: 5 .  gabapentin (NEURONTIN) 100 MG capsule, Take by mouth., Disp: , Rfl:  .  Galcanezumab-gnlm (EMGALITY) 120 MG/ML SOAJ, Inject into the skin., Disp: , Rfl:  .  hydrOXYzine (ATARAX/VISTARIL) 25 MG tablet, Take 25 mg by mouth daily., Disp: , Rfl:  .  ketoconazole (NIZORAL) 2 % cream, Apply topically., Disp: , Rfl:  .  levonorgestrel (MIRENA) 20 MCG/24HR IUD, by Intrauterine route., Disp: , Rfl:  .  montelukast (SINGULAIR) 10 MG tablet, Take by mouth., Disp: , Rfl:  .  mupirocin ointment (BACTROBAN) 2 %, Apply 1 application topically daily., Disp: 22 g, Rfl: 1 .  Olopatadine HCl 0.2 % SOLN, Apply 1 drop to eye daily as needed (itchy/watery eyes). Do not put directly over contacts. Wait 15 minutes., Disp: 2.5 mL, Rfl: 5 .  ondansetron (ZOFRAN-ODT) 4 MG disintegrating tablet, Take 1 tablet (4 mg total) by mouth every 8 (eight) hours as needed for nausea or vomiting., Disp: 20 tablet, Rfl: 0 .  rizatriptan (MAXALT-MLT) 10 MG disintegrating tablet, Take 10 mg by mouth as directed. TAKE 1 TAB AT HEADACHE ONSET MAY TAKE A 2ND DOSE AFTER 2 HOURS IF NEEDED NO MORE THAN 2 DOSES/24 HRS, Disp: , Rfl: 3 .  terbinafine (LAMISIL) 250 MG tablet, Take 1 by mouth as directed., Disp: 28 tablet, Rfl: 0 .  venlafaxine XR (EFFEXOR-XR) 75 MG 24 hr capsule, Take by mouth., Disp: , Rfl:  .  Vitamin D, Ergocalciferol, (DRISDOL) 1.25 MG (50000 UNIT) CAPS capsule, Take 1 capsule (50,000 Units total) by mouth every 7 (seven) days., Disp: 30 capsule, Rfl: 1   Family History  Problem Relation Age of Onset  . Diabetes Mellitus II Father   . Cancer Father   . Colon cancer Father   . Cancer Paternal Grandfather   . Lung cancer Paternal Grandfather      Social History   Tobacco Use  . Smoking status: Never Smoker  . Smokeless  tobacco: Never Used  Vaping Use  . Vaping Use: Never used  Substance Use Topics  . Alcohol use: Yes    Alcohol/week: 0.0 standard drinks    Comment: occas  . Drug use: No    Allergies as of 04/20/2020 - Review Complete 04/20/2020  Allergen Reaction Noted  . Doxycycline Nausea And Vomiting 10/28/2019  . Amoxicillin-pot clavulanate Rash and Other (See Comments) 01/06/2015    Imaging Studies: Reviewed  Assessment and Plan:   NOU CHARD is a 25 y.o. female with history of appendectomy, history of recurrent sinusitis is seen in consultation for chronic diarrhea with abdominal bloating and cramps.  CBC, CMP, TSH are unremarkable.  HIV negative.  IgG, IgM levels normal, food allergy profile, alpha gal panel negative.  GI profile PCR is negative including C. difficile.  H. pylori breath  test negative, celiac panel negative, colonoscopy with TI evaluation is normal.  Today, I have discussed with her about possible lactose intolerance and strict lactose-free diet for 4 weeks.  If there is no symptom relief, recommend empiric trial of rifaximin or metronidazole for bacterial overgrowth.  Also, recommend checking pancreatic fecal elastase levels, fecal calprotectin levels.  I have also advised her to discuss about switching her antianxiety medication to low-dose amitriptyline for IBS by her PCP who is managing her anxiety at this time   Patient will update me her status via MyChart   Follow Up Instructions:   I discussed the assessment and treatment plan with the patient. The patient was provided an opportunity to ask questions and all were answered. The patient agreed with the plan and demonstrated an understanding of the instructions.   The patient was advised to call back or seek an in-person evaluation if the symptoms worsen or if the condition fails to improve as anticipated.  I provided 15 minutes of face-to-face time during this encounter.   Follow up in 3 months   Cephas Darby, MD

## 2020-04-20 NOTE — Progress Notes (Deleted)
Follow Up Note  RE: Angela Rocha MRN: [id number] DOB: 06/08/1995 Date of Office Visit: 04/20/2020  Referring provider: Wayland Denis, PA-C Primary care provider: Wayland Denis, PA-C  Chief Complaint: No chief complaint on file.  History of Present Illness: I had the pleasure of seeing Angela Rocha for a follow up visit at the [facility] on 04/20/2020. She is a 25 y.o. female, who is being followed for nonallergic rhinitis, asthma, adverse food reaction, history of frequent upper respiratory infection, drug allergy, heartburn. Her previous allergy office visit was on 02/12/2020 with Dr. Maudie Mercury. Today is a regular follow up visit.  reviewed the bloodwork. Blood count and alpha gal (checks for red meat allergy) were all normal which is great.  Environmental allergy panel for indoor/outdoor allergens, [place] pig and stinging insect panel were all negative. - as no environmental allergies were found, recommend ENT evaluation next. The nasal sprays can still help with the symptoms though.  Egg, milk, peanuts, tree nuts were all negative. Avoid foods that are bothersome and keep GI appointment.   Immunoglobulin levels were normal - tells me that your body is making enough antibodies to fight off infections.  Your tetanus and diptheria titers were present - checks for T cell function of your immune system.   Your pneumococcal titers were absent - this tells me that you need a pneumonia shot (pneumovax). You can get this at your PCP's office and then we will recheck the levels in 4 weeks to see if your body responded appropriately to the vaccine. A lot of the times s. Pneumonia is the bacteria responsible for bacterial upper respiratory infections.  Other allergic rhinitis Perennial rhinoconjunctivitis symptoms for the last 3+ years with worsening in the spring.  Tried Zyrtec, Chlortab, saline rinse and Flonase with some benefit.  No prior allergy evaluation.  Had T&A  and myringotomy tubes as a child.  Today's skin testing showed: Negative to indoor/outdoor allergens.  Will double check results via bloodwork instead of intradermals as already getting bloodwork for other reasons.   May use over the counter antihistamines such as Zyrtec (cetirizine), Claritin (loratadine), Allegra (fexofenadine), or Xyzal (levocetirizine) daily as needed. May take twice a day during flares.   May use olopatadine eye drops 0.2% once a day as needed for itchy/watery eyes. ? Do not put directly over the contact lenses. Wait about 15 minutes before contact lens in.  May use Flonase (fluticasone) nasal spray 1 spray per nostril twice a day as needed for nasal congestion.   Nasal saline spray (i.e., Simply Saline) or nasal saline lavage (i.e., NeilMed) is recommended as needed and prior to medicated nasal sprays.  If no allergies found via bloodwork then will refer to ENT next.  Asthma, not well controlled Diagnosed with asthma about 5 years ago and currently on Symbicort 160 mcg 2 puffs twice a day and albuterol as needed with good benefit.  Advair caused shaking.  Triggers include exertion, strong scents and infections.  Had multiple courses of oral prednisone the last year. COVID-19 infection in January 2022 - not vaccinated. No prior biologics for asthma. Used Singulair in the past and not sure why it was stopped.   ACT score 12.  Spirometry consistent with mild obstructive disease with greater than 200cc improvement in FEV1 post bronchodilator treatment. Clinically feeling improved.   Daily controller medication(s): ? Start Trelegy 251mcg 1 puff daily. Rinse mouth after each use. Sample given. ? Stop Symbicort for now.  May use albuterol rescue inhaler 2 puffs every 4 to 6 hours as needed for shortness of breath, chest tightness, coughing, and wheezing. May use albuterol rescue inhaler 2 puffs 5 to 15 minutes prior to strenuous physical activities. Monitor frequency of  use.   Repeat spirometry at next visit.  Get blood work to see if she would qualify for Biologics.  Eosinophil counts were 100 during last blood draw but patient states she just finished prednisone at that time. No prednisone use for the past 2 weeks. Ige 28.  Adverse reaction to food, subsequent encounter Patient concerned about food allergies. Dairy and fried foods cause diarrhea even with lactaid pills. Eggs cause perioral rash and diarrhea. Red meat cause diarrhea and rash - no recent tick bites. Peanuts/tree nuts caused hives and nasal congestion. No formal work up.  Today's skin testing was negative to select foods.   Keep GI appointment.   Keep track of symptoms and write down what you had eaten/come across those days.   Avoid foods that are bothersome - milk, egg, red meat, peanuts, tree nuts. ? Get bloodwork to double check results.   I have prescribed epinephrine injectable and demonstrated proper use. For mild symptoms you can take over the counter antihistamines such as Benadryl and monitor symptoms closely. If symptoms worsen or if you have severe symptoms including breathing issues, throat closure, significant swelling, whole body hives, severe diarrhea and vomiting, lightheadedness then inject epinephrine and seek immediate medical care afterwards.  Action plan given.   History of frequent upper respiratory infection Frequent sinus infections, pneumonia and ear infections in the past.  No prior immune evaluation.  Had 7 courses of antibiotics in the last year.  Received tetanus shot in December 2021.  Keep track of infections.  Get basic immune evaluation blood work.  Drug reaction Broke out in hives or child after Augmentin.  Treated as outpatient.  Doxycycline causes rash with nausea and vomiting.  Given her clinical history of frequent upper respiratory infections I recommend that she undergoes penicillin skin testing and challenge in the future.  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  No prior work-up.  Continue to avoid.   Get bloodwork.  History of urticaria History of breaking out in hives with certain personal care products.  Gave handout on proper skin care measures.  Heartburn Takes over-the-counter medications as needed with good benefit.  See below for dietary and lifestyle modifications.  Return in about 2 months (around 04/11/2020).   Assessment and Plan: Angela Rocha is a 25 y.o. female with: No problem-specific Assessment & Plan notes found for this encounter.  No follow-ups on file.  No orders of the defined types were placed in this encounter.  Lab Orders  No laboratory test(s) ordered today    Diagnostics: Spirometry:  Tracings reviewed. Her effort: {Blank single:19197::"Good reproducible efforts.","It was hard to get consistent efforts and there is a question as to whether this reflects a maximal maneuver.","Poor effort, data can not be interpreted."} FVC: ***L FEV1: ***L, ***% predicted FEV1/FVC ratio: ***% Interpretation: {Blank single:19197::"Spirometry consistent with mild obstructive disease","Spirometry consistent with moderate obstructive disease","Spirometry consistent with severe obstructive disease","Spirometry consistent with possible restrictive disease","Spirometry consistent with mixed obstructive and restrictive disease","Spirometry uninterpretable due to technique","Spirometry consistent with normal pattern","No overt abnormalities noted given today's efforts"}.  Please see scanned spirometry results for details.  Skin Testing: {Blank single:19197::"Select foods","Environmental allergy panel","Environmental allergy panel and select foods","Food allergy panel","None","Deferred  due to recent antihistamines use"}. Positive test to: ***. Negative test to: ***.  Results discussed with patient/family.   Medication List:  Current  Outpatient Medications  Medication Sig Dispense Refill  . albuterol (VENTOLIN HFA) 108 (90 Base) MCG/ACT inhaler Inhale 2 puffs into the lungs every 6 (six) hours as needed for wheezing or shortness of breath. 1 each 4  . amphetamine-dextroamphetamine (ADDERALL XR) 20 MG 24 hr capsule Take by mouth.    . budesonide-formoterol (SYMBICORT) 160-4.5 MCG/ACT inhaler Inhale 2 puffs into the lungs 2 (two) times daily. 1 each 5  . busPIRone (BUSPAR) 7.5 MG tablet Take 7.5 mg by mouth 2 (two) times daily.    . cetirizine (ZYRTEC) 10 MG tablet Take 10 mg by mouth daily.  3  . ciclopirox (LOPROX) 0.77 % cream Apply topically 2 (two) times daily. To feet for 3-4 weeks then once weekly 90 g 2  . cyanocobalamin (,VITAMIN B-12,) 1000 MCG/ML injection INJECT 1 ML (1,000 MCG TOTAL) INTO THE MUSCLE EVERY 30 (THIRTY) DAYS. 10 mL 1  . dexmethylphenidate (FOCALIN) 2.5 MG tablet Take 2.5 mg by mouth daily.    Marland Kitchen dicyclomine (BENTYL) 10 MG capsule TAKE 1 CAPSULE (10 MG TOTAL) BY MOUTH 3 (THREE) TIMES DAILY WITH MEALS AS NEEDED FOR SPASMS. 270 capsule 0  . EPINEPHrine (AUVI-Q) 0.3 mg/0.3 mL IJ SOAJ injection Inject 0.3 mg into the muscle as needed for anaphylaxis. 1 each 2  . fluticasone (FLONASE) 50 MCG/ACT nasal spray Place 1 spray into both nostrils 2 (two) times daily as needed for allergies. 16 g 5  . Fluticasone-Umeclidin-Vilant (TRELEGY ELLIPTA) 200-62.5-25 MCG/INH AEPB Inhale 1 puff into the lungs daily. Rinse mouth after each use. 60 each 5  . gabapentin (NEURONTIN) 100 MG capsule Take by mouth.    Marland Kitchen Galcanezumab-gnlm (EMGALITY) 120 MG/ML SOAJ Inject into the skin.    . hydrOXYzine (ATARAX/VISTARIL) 25 MG tablet Take 25 mg by mouth daily.    Marland Kitchen ketoconazole (NIZORAL) 2 % cream Apply topically.    . mupirocin ointment (BACTROBAN) 2 % Apply 1 application topically daily. 22 g 1  . Olopatadine HCl 0.2 % SOLN Apply 1 drop to eye daily as needed (itchy/watery eyes). Do not put directly over contacts. Wait 15 minutes.  2.5 mL 5  . ondansetron (ZOFRAN-ODT) 4 MG disintegrating tablet Take 1 tablet (4 mg total) by mouth every 8 (eight) hours as needed for nausea or vomiting. 20 tablet 0  . rizatriptan (MAXALT-MLT) 10 MG disintegrating tablet Take 10 mg by mouth as directed. TAKE 1 TAB AT HEADACHE ONSET MAY TAKE A 2ND DOSE AFTER 2 HOURS IF NEEDED NO MORE THAN 2 DOSES/24 HRS  3  . terbinafine (LAMISIL) 250 MG tablet Take 1 by mouth as directed. 28 tablet 0  . venlafaxine XR (EFFEXOR-XR) 37.5 MG 24 hr capsule Take 37.5 mg by mouth daily.    . Vitamin D, Ergocalciferol, (DRISDOL) 1.25 MG (50000 UNIT) CAPS capsule Take 1 capsule (50,000 Units total) by mouth every 7 (seven) days. 30 capsule 1   No current facility-administered medications for this visit.   Allergies: Allergies  Allergen Reactions  . Doxycycline Nausea And Vomiting    nausea  . Amoxicillin-Pot Clavulanate Rash and Other (See Comments)    Has patient had a PCN reaction causing immediate rash, facial/tongue/throat swelling, SOB or lightheadedness with hypotension: Yes Has patient had a PCN reaction causing severe rash involving mucus membranes or skin necrosis: No Has patient had a PCN reaction that required hospitalization: No Has  patient had a PCN reaction occurring within the last 10 years: No If all of the above answers are "NO", then may proceed with Cephalosporin use.    I reviewed her past medical history, social history, family history, and environmental history and no significant changes have been reported from her previous visit.  Review of Systems  Constitutional: Negative for appetite change, chills, fever and unexpected weight change.  HENT: Positive for congestion. Negative for rhinorrhea.   Eyes: Negative for itching.  Respiratory: Negative for cough, chest tightness, shortness of breath and wheezing.   Cardiovascular: Negative for chest pain.  Gastrointestinal: Positive for abdominal pain and diarrhea.  Genitourinary: Negative  for difficulty urinating.  Skin: Negative for rash.  Neurological: Positive for headaches.   Objective: There were no vitals taken for this visit. There is no height or weight on file to calculate BMI. Physical Exam Vitals and nursing note reviewed.  Constitutional:      Appearance: Normal appearance. She is well-developed.  HENT:     Head: Normocephalic and atraumatic.     Right Ear: Tympanic membrane and external ear normal.     Left Ear: Tympanic membrane and external ear normal.     Nose: Congestion (on right side) present.     Mouth/Throat:     Mouth: Mucous membranes are moist.     Pharynx: Oropharynx is clear.  Eyes:     Conjunctiva/sclera: Conjunctivae normal.  Cardiovascular:     Rate and Rhythm: Normal rate and regular rhythm.     Heart sounds: Normal heart sounds. No murmur heard. No friction rub. No gallop.   Pulmonary:     Effort: Pulmonary effort is normal.     Breath sounds: Normal breath sounds. No wheezing, rhonchi or rales.  Musculoskeletal:     Cervical back: Neck supple.  Skin:    General: Skin is warm.     Findings: No rash.  Neurological:     Mental Status: She is alert and oriented to person, place, and time.  Psychiatric:        Behavior: Behavior normal.    Previous notes and tests were reviewed. The plan was reviewed with the patient/family, and all questions/concerned were addressed.  It was my pleasure to see Angela Rocha today and participate in her care. Please feel free to contact me with any questions or concerns.  Sincerely,  Rexene Alberts, DO Allergy & Immunology  [facility] office: [place] office: [phone]

## 2020-05-21 ENCOUNTER — Ambulatory Visit: Payer: BC Managed Care – PPO | Admitting: Dermatology

## 2020-05-21 ENCOUNTER — Other Ambulatory Visit: Payer: Self-pay

## 2020-05-21 DIAGNOSIS — Z1283 Encounter for screening for malignant neoplasm of skin: Secondary | ICD-10-CM | POA: Diagnosis not present

## 2020-05-21 DIAGNOSIS — B353 Tinea pedis: Secondary | ICD-10-CM | POA: Diagnosis not present

## 2020-05-21 DIAGNOSIS — L821 Other seborrheic keratosis: Secondary | ICD-10-CM

## 2020-05-21 DIAGNOSIS — L309 Dermatitis, unspecified: Secondary | ICD-10-CM

## 2020-05-21 DIAGNOSIS — B351 Tinea unguium: Secondary | ICD-10-CM

## 2020-05-21 DIAGNOSIS — D229 Melanocytic nevi, unspecified: Secondary | ICD-10-CM

## 2020-05-21 DIAGNOSIS — D1800 Hemangioma unspecified site: Secondary | ICD-10-CM

## 2020-05-21 DIAGNOSIS — B079 Viral wart, unspecified: Secondary | ICD-10-CM

## 2020-05-21 DIAGNOSIS — L814 Other melanin hyperpigmentation: Secondary | ICD-10-CM

## 2020-05-21 DIAGNOSIS — Z86018 Personal history of other benign neoplasm: Secondary | ICD-10-CM

## 2020-05-21 DIAGNOSIS — L578 Other skin changes due to chronic exposure to nonionizing radiation: Secondary | ICD-10-CM

## 2020-05-21 MED ORDER — HYDROCORTISONE 2.5 % EX CREA
TOPICAL_CREAM | CUTANEOUS | 0 refills | Status: DC
Start: 2020-05-21 — End: 2022-09-20

## 2020-05-21 NOTE — Progress Notes (Signed)
Follow-Up Visit   Subjective  Angela Rocha is a 25 y.o. female who presents for the following: tbse (Patient here today for tbse ,follow up on warts at hand, and tinea pedis at feet. She states she still has some warts on right hand. She also reports rash at left eye area and would like to discuss. ).  Patient here for full body skin exam and skin cancer screening.   The following portions of the chart were reviewed this encounter and updated as appropriate:  Tobacco  Allergies  Meds  Problems  Med Hx  Surg Hx  Fam Hx       Objective  Well appearing patient in no apparent distress; mood and affect are within normal limits.  A full examination was performed including scalp, head, eyes, ears, nose, lips, neck, chest, axillae, abdomen, back, buttocks, bilateral upper extremities, bilateral lower extremities, hands, feet, fingers, toes, fingernails, and toenails. All findings within normal limits unless otherwise noted below.  Objective  Right Foot - Anterior: Scaling and maceration web spaces and over distal and lateral soles.   Objective  Right 5th finger lateral and medial nail fold x 3 (3): Verrucous papules at  Right 5th finger lateral and medial nail fold x 3  Objective  left upper and lower eyelid: Scaly pink plaque left upper and lower eyelid  Objective  Left Foot - Anterior: multiple nails left foot thickened with subungual debris  Assessment & Plan  Tinea pedis of both feet Right Foot - Anterior  Continue ciclopirox cream twice daily for 3-4 weeks then once weekly for prevention.   Recommend baby feet to help with dryness and scale at feet   Viral warts, unspecified type (3) Right 5th finger lateral and medial nail fold x 3  Discussed treatment with cryotherapy, squaric acid, cantharidin plus  Prior to procedure, discussed risks of blister formation, small wound, skin dyspigmentation, or rare scar following cryotherapy.   Squaric Acid 3% applied to  warts today. Prior to application reviewed risk of inflammation and irritation.  Cantharidin is a blistering agent that comes from a beetle.  It needs to be washed off in about 4 hours after application.  Although it is painless when applied in office, it may cause symptoms of mild pain and burning several hours later.  Treated areas will swell and turn red, and blisters may form.  Vaseline and a bandaid may be applied until wound has healed.  Once healed, the skin may remain temporarily discolored.  It can take weeks to months for pigmentation to return to normal.  The molluscum may resolve with this topical treatment, but often, additional treatments may be required to clear molluscum.  It is recommended to keep the skin well-moisturized and avoid scratching affected area to help prevent spread of the molluscum.   Destruction of lesion - Right 5th finger lateral and medial nail fold x 3  Destruction method: cryotherapy   Informed consent: discussed and consent obtained   Lesion destroyed using liquid nitrogen: Yes   Cryotherapy cycles:  2 Outcome: patient tolerated procedure well with no complications   Post-procedure details: wound care instructions given    Destruction of lesion - Right 5th finger lateral and medial nail fold x 3  Destruction method: chemical removal   Informed consent: discussed and consent obtained   Timeout:  patient name, date of birth, surgical site, and procedure verified Chemical destruction method: cantharidin   Chemical destruction method comment:  Plus Application time:  4  hours Procedure instructions: patient instructed to wash and dry area   Outcome: patient tolerated procedure well with no complications   Post-procedure details: wound care instructions given    Dermatitis left upper and lower eyelid  Start hydrocortisone 2.5% cream twice a day as needed up to 1 week    hydrocortisone 2.5 % cream - left upper and lower eyelid  Onychomycosis Left  Foot - Anterior  Chronic condition with duration of decades. Condition is bothersome to patient. Currently flared.   Past culture grew trichophyton rubrum   Continue pulse dose terbinafine to be taken for 7 days every other month for 7 months (Take for 7 days at the beginnings of months 1, 3, 5, and 7; dispense 28 tablets).    Terbinafine is an anti-fungal medicine that can be applied to the skin (over the counter) or taken by mouth (prescription) to treat fungal infections. The pill version is often used to treat fungal infections of the nails or scalp. While most people do not have any side effects from taking terbinafine pills, some possible side effects of the medicine can include taste changes, headache, loss of smell, vision changes, nausea, vomiting, or diarrhea.    Rare side effects can include irritation of the liver, allergic reaction, or decrease in blood counts (which may show up as not feeling well or developing an infection). If you are concerned about any of these side effects, please stop the medicine and call your doctor, or in the case of an emergency such as feeling very unwell, seek immediate medical care.    Lentigines - Scattered tan macules - Due to sun exposure - Benign-appering, observe - Recommend daily broad spectrum sunscreen SPF 30+ to sun-exposed areas, reapply every 2 hours as needed. - Call for any changes  Seborrheic Keratoses - Stuck-on, waxy, tan-brown papules and/or plaques  - Benign-appearing - Discussed benign etiology and prognosis. - Observe - Call for any changes  Melanocytic Nevi - Tan-brown and/or pink-flesh-colored symmetric macules and papules - Benign appearing on exam today - Observation - Call clinic for new or changing moles - Recommend daily use of broad spectrum spf 30+ sunscreen to sun-exposed areas.   Hemangiomas - Red papules - Discussed benign nature - Observe - Call for any changes  Actinic Damage - Chronic condition,  secondary to cumulative UV/sun exposure - diffuse scaly erythematous macules with underlying dyspigmentation - Recommend daily broad spectrum sunscreen SPF 30+ to sun-exposed areas, reapply every 2 hours as needed.  - Staying in the shade or wearing long sleeves, sun glasses (UVA+UVB protection) and wide brim hats (4-inch brim around the entire circumference of the hat) are also recommended for sun protection.  - Call for new or changing lesions.  History of Dysplastic Nevi - No evidence of recurrence today at right upper back (2022) - Recommend regular full body skin exams - Recommend daily broad spectrum sunscreen SPF 30+ to sun-exposed areas, reapply every 2 hours as needed.  - Call if any new or changing lesions are noted between office visits   Skin cancer screening performed today.  Return in about 6 months (around 11/21/2020) for tbse.  I, Ruthell Rummage, CMA, am acting as scribe for Forest Gleason, MD.  Documentation: I have reviewed the above documentation for accuracy and completeness, and I agree with the above.  Forest Gleason, MD

## 2020-05-21 NOTE — Patient Instructions (Addendum)
Baby feet treatment helps with dryness at feet   Melanoma ABCDEs  Melanoma is the most dangerous type of skin cancer, and is the leading cause of death from skin disease.  You are more likely to develop melanoma if you:  Have light-colored skin, light-colored eyes, or red or blond hair  Spend a lot of time in the sun  Tan regularly, either outdoors or in a tanning bed  Have had blistering sunburns, especially during childhood  Have a close family member who has had a melanoma  Have atypical moles or large birthmarks  Early detection of melanoma is key since treatment is typically straightforward and cure rates are extremely high if we catch it early.   The first sign of melanoma is often a change in a mole or a new dark spot.  The ABCDE system is a way of remembering the signs of melanoma.  A for asymmetry:  The two halves do not match. B for border:  The edges of the growth are irregular. C for color:  A mixture of colors are present instead of an even brown color. D for diameter:  Melanomas are usually (but not always) greater than 75mm - the size of a pencil eraser. E for evolution:  The spot keeps changing in size, shape, and color.  Please check your skin once per month between visits. You can use a small mirror in front and a large mirror behind you to keep an eye on the back side or your body.   If you see any new or changing lesions before your next follow-up, please call to schedule a visit.  Please continue daily skin protection including broad spectrum sunscreen SPF 30+ to sun-exposed areas, reapplying every 2 hours as needed when you're outdoors.   Staying in the shade or wearing long sleeves, sun glasses (UVA+UVB protection) and wide brim hats (4-inch brim around the entire circumference of the hat) are also recommended for sun protection.   Recommend taking Heliocare sun protection supplement daily in sunny weather for additional sun protection. For maximum protection  on the sunniest days, you can take up to 2 capsules of regular Heliocare OR take 1 capsule of Heliocare Ultra. For prolonged exposure (such as a full day in the sun), you can repeat your dose of the supplement 4 hours after your first dose. Heliocare can be purchased at [facility] or at [email or web address].   Recommend pulse dose terbinafine to be taken for 7 days every other month for 7 months (Take for 7 days at the beginnings of months 1, 3, 5, and 7; dispense 28 tablets).    Terbinafine is an anti-fungal medicine that can be applied to the skin (over the counter) or taken by mouth (prescription) to treat fungal infections. The pill version is often used to treat fungal infections of the nails or scalp. While most people do not have any side effects from taking terbinafine pills, some possible side effects of the medicine can include taste changes, headache, loss of smell, vision changes, nausea, vomiting, or diarrhea.    Rare side effects can include irritation of the liver, allergic reaction, or decrease in blood counts (which may show up as not feeling well or developing an infection). If you are concerned about any of these side effects, please stop the medicine and call your doctor, or in the case of an emergency such as feeling very unwell, seek immediate medical care.   Viral Warts & Molluscum Contagiosum  Viral warts and  molluscum contagiosum are growths of the skin caused by viral infection of the skin. If you have been given the diagnosis of viral warts or molluscum contagiosum there are a few things that you must understand about your condition:  1. There is no guaranteed treatment method available for this condition. 2. Multiple treatments may be required, 3. The treatments may be time consuming and require multiple visits to the dermatology office. 4. The treatment may be expensive. You will be charged each time you come into the office to have the spots treated. 5. The  treated areas may develop new lesions further complicating treatment. 6. The treated areas may leave a scar. 7. There is no guarantee that even after multiple treatments that the spots will be successfully treated. 8. These are caused by a viral infection and can be spread to other areas of the skin and to other people by direct contact. Therefore, new spots may occur.  Cantharidin Plus is a blistering agent that comes from a beetle.  It needs to be washed off in about 4 hours after application.  Although it is painless when applied in office, it may cause symptoms of mild pain and burning several hours later.  Treated areas will swell and turn red, and blisters may form.  Vaseline and a bandaid may be applied until wound has healed.  Once healed, the skin may remain temporarily discolored.  It can take weeks to months for pigmentation to return to normal.  Advised to wash off with soap and water in 4 hours or sooner if it becomes tender before then.  Cryotherapy Aftercare  . Wash gently with soap and water everyday.   Marland Kitchen Apply Vaseline and Band-Aid daily until healed.  Recommend Serica moisturizing scar formula cream every night or [employer] brand or Mederma silicone scar sheet every night for the first year after a scar appears to help with scar remodeling if desired. Scars remodel on their own for a full year.

## 2020-05-31 ENCOUNTER — Encounter: Payer: Self-pay | Admitting: Dermatology

## 2020-07-16 ENCOUNTER — Ambulatory Visit: Payer: Self-pay | Admitting: Dermatology

## 2020-07-22 ENCOUNTER — Telehealth: Payer: BC Managed Care – PPO | Admitting: Gastroenterology

## 2020-07-29 ENCOUNTER — Ambulatory Visit: Payer: BC Managed Care – PPO | Admitting: Gastroenterology

## 2020-07-29 ENCOUNTER — Encounter: Payer: Self-pay | Admitting: *Deleted

## 2020-08-04 ENCOUNTER — Encounter: Payer: Self-pay | Admitting: Intensive Care

## 2020-08-04 ENCOUNTER — Emergency Department: Payer: BC Managed Care – PPO

## 2020-08-04 ENCOUNTER — Other Ambulatory Visit: Payer: Self-pay

## 2020-08-04 ENCOUNTER — Emergency Department
Admission: EM | Admit: 2020-08-04 | Discharge: 2020-08-04 | Disposition: A | Discharge status: Home / Self Care | Payer: BC Managed Care – PPO | Attending: Emergency Medicine | Admitting: Emergency Medicine

## 2020-08-04 DIAGNOSIS — K8020 Calculus of gallbladder without cholecystitis without obstruction: Secondary | ICD-10-CM | POA: Insufficient documentation

## 2020-08-04 DIAGNOSIS — K8050 Calculus of bile duct without cholangitis or cholecystitis without obstruction: Secondary | ICD-10-CM

## 2020-08-04 DIAGNOSIS — Z7951 Long term (current) use of inhaled steroids: Secondary | ICD-10-CM | POA: Insufficient documentation

## 2020-08-04 DIAGNOSIS — J45909 Unspecified asthma, uncomplicated: Secondary | ICD-10-CM | POA: Diagnosis not present

## 2020-08-04 DIAGNOSIS — R1011 Right upper quadrant pain: Secondary | ICD-10-CM | POA: Diagnosis present

## 2020-08-04 LAB — COMPREHENSIVE METABOLIC PANEL
ALT: 14 U/L (ref 0–44)
AST: 19 U/L (ref 15–41)
Albumin: 4 g/dL (ref 3.5–5.0)
Alkaline Phosphatase: 76 U/L (ref 38–126)
Anion gap: 8 (ref 5–15)
BUN: 16 mg/dL (ref 6–20)
CO2: 26 mmol/L (ref 22–32)
Calcium: 8.8 mg/dL — ABNORMAL LOW (ref 8.9–10.3)
Chloride: 106 mmol/L (ref 98–111)
Creatinine, Ser: 0.75 mg/dL (ref 0.44–1.00)
GFR, Estimated: >60 mL/min (ref >60)
Glucose, Bld: 97 mg/dL (ref 70–99)
Potassium: 4.1 mmol/L (ref 3.5–5.1)
Sodium: 140 mmol/L (ref 135–145)
Total Bilirubin: 0.6 mg/dL (ref 0.3–1.2)
Total Protein: 6.8 g/dL (ref 6.5–8.1)

## 2020-08-04 LAB — URINALYSIS, COMPLETE (UACMP) WITH MICROSCOPIC
Bilirubin Urine: NEGATIVE
Glucose, UA: NEGATIVE mg/dL
Ketones, ur: NEGATIVE mg/dL
Nitrite: NEGATIVE
Protein, ur: NEGATIVE mg/dL
Specific Gravity, Urine: 1.018 (ref 1.005–1.030)
pH: 6 (ref 5.0–8.0)

## 2020-08-04 LAB — PREGNANCY, URINE: Preg Test, Ur: NEGATIVE

## 2020-08-04 LAB — CBC
HCT: 38.4 % (ref 36.0–46.0)
Hemoglobin: 12.4 g/dL (ref 12.0–15.0)
MCH: 28.8 pg (ref 26.0–34.0)
MCHC: 32.3 g/dL (ref 30.0–36.0)
MCV: 89.3 fL (ref 80.0–100.0)
Platelets: 213 10*3/uL (ref 150–400)
RBC: 4.3 MIL/uL (ref 3.87–5.11)
RDW: 14.7 % (ref 11.5–15.5)
WBC: 4.4 10*3/uL (ref 4.0–10.5)
nRBC: 0 % (ref 0.0–0.2)

## 2020-08-04 LAB — LIPASE, BLOOD: Lipase: 28 U/L (ref 11–51)

## 2020-08-04 MED ORDER — DICYCLOMINE HCL 10 MG PO CAPS
10.0000 mg | ORAL_CAPSULE | Freq: Once | ORAL | Status: AC
  Administered 2020-08-04: 10 mg via ORAL
  Filled 2020-08-04: qty 1

## 2020-08-04 MED ORDER — DICYCLOMINE HCL 10 MG PO CAPS: 10.0000 mg | ORAL_CAPSULE | Freq: Four times a day (QID) | ORAL | 0 refills | Status: DC | PRN

## 2020-08-04 MED ORDER — FENTANYL CITRATE (PF) 100 MCG/2ML IJ SOLN
50.0000 ug | Freq: Once | INTRAMUSCULAR | Status: AC
  Administered 2020-08-04: 50 ug via INTRAVENOUS
  Filled 2020-08-04: qty 2

## 2020-08-04 MED ORDER — IOHEXOL 350 MG/ML SOLN
75.0000 mL | Freq: Once | INTRAVENOUS | Status: AC | PRN
  Administered 2020-08-04: 75 mL via INTRAVENOUS
  Filled 2020-08-04: qty 75

## 2020-08-04 MED ORDER — CEPHALEXIN 500 MG PO CAPS: 1000.0000 mg | ORAL_CAPSULE | Freq: Two times a day (BID) | ORAL | 0 refills | Status: DC

## 2020-08-04 MED ORDER — MORPHINE SULFATE (PF) 4 MG/ML IV SOLN
4.0000 mg | Freq: Once | INTRAVENOUS | Status: AC
  Administered 2020-08-04: 4 mg via INTRAVENOUS
  Filled 2020-08-04: qty 1

## 2020-08-04 MED ORDER — ONDANSETRON 8 MG PO TBDP
8.0000 mg | ORAL_TABLET | Freq: Once | ORAL | Status: AC
  Administered 2020-08-04: 8 mg via ORAL
  Filled 2020-08-04: qty 1

## 2020-08-04 MED ORDER — SODIUM CHLORIDE 0.9 % IV BOLUS
1000.0000 mL | Freq: Once | INTRAVENOUS | Status: AC
  Administered 2020-08-04: 1000 mL via INTRAVENOUS

## 2020-08-04 MED ORDER — PROMETHAZINE HCL 25 MG PO TABS: 25.0000 mg | ORAL_TABLET | Freq: Four times a day (QID) | ORAL | 0 refills | Status: DC | PRN

## 2020-08-04 MED ORDER — HYDROMORPHONE HCL 1 MG/ML IJ SOLN
1.0000 mg | Freq: Once | INTRAMUSCULAR | Status: AC
  Administered 2020-08-04: 1 mg via INTRAVENOUS
  Filled 2020-08-04: qty 1

## 2020-08-04 MED ORDER — DIPHENHYDRAMINE HCL 50 MG/ML IJ SOLN
25.0000 mg | Freq: Once | INTRAMUSCULAR | Status: AC
  Administered 2020-08-04: 25 mg via INTRAVENOUS
  Filled 2020-08-04: qty 1

## 2020-08-04 NOTE — ED Triage Notes (Signed)
Patient c/o ruq pain with emesis and diarrhea. Tender to touch. Symtpoms X2 days

## 2020-08-04 NOTE — ED Notes (Signed)
Patient complains of pain increasing states the Morphine is not helping.  Notified provider via secure chat.

## 2020-08-04 NOTE — ED Provider Notes (Signed)
Medical screening examination/treatment/procedure(s) were conducted as a shared visit with non-physician practitioner(s) and myself.  I personally evaluated the patient during the encounter.  At this time, patient has just returned from CT scan.  She advised that after receiving basically any type of opioid including morphine she will develop itching.  She denies that she has any significant allergy though other than a history of itching after receiving narcotics.  Discussed with the patient and have ordered Benadryl 25 mg IV for prevention of itching, currently not symptomatic.  Awaiting imaging from CT scan.   Delman Kitten, MD 08/04/20 [phone]

## 2020-08-04 NOTE — ED Provider Notes (Signed)
[facility] Emergency Department Provider Note  ____________________________________________  Time seen: Approximately 5:22 PM  I have reviewed the triage vital signs and the nursing notes.   HISTORY  Chief Complaint Abdominal Pain    HPI Angela Rocha is a 25 y.o. female who presents the emergency department complaining of right upper quadrant abdominal pain, nausea and vomiting and diarrhea.  Patient states that she has had some issues with her stomach for the past 2 years.  She is undergone multiple work-ups, has not received a definitive diagnosis and states that GI believes that she has irritable bowel syndrome.  She states that she tries to avoid any spicy or greasy type foods.  She states that 2 days ago she was come back from the beach, had a grilled chicken sandwich and had onset of sharp right upper quadrant abdominal pain with nausea and vomiting after about 45 minutes from eating.  She states that she has had some ongoing right upper quadrant abdominal pain, ongoing nausea and vomiting and diarrhea.  This is typically the presentation with her pain where most of the pain is on the right side.  She never has left-sided abdominal pain.  There was no reports of blood in the vomit or stool.  Patient states that the vomit did have some green tinge to it today.  No fevers or chills.  No URI symptoms.  Patient has had a colonoscopy in the past with no acute findings.  She states that she has been tested for multiple things including autoimmune disorders as well as food allergies such as dairy and been tested for celiac disease without results.  Patient has no urinary symptoms to include dysuria, polyuria, hematuria.  No reports of vaginal bleeding or discharge.  Patient has irregular menstrual cycle due to in-place IUD.       Past Medical History:  Diagnosis Date   ADHD    Anxiety    Asthma    History of dysplastic nevus 03/04/2020   right upper back,  severe/excision   Migraine    Pelvic congestion syndrome    Recurrent upper respiratory infection (URI)    Urticaria     Patient Active Problem List   Diagnosis Date Noted   Pain from implanted hardware 03/23/2020   History of colon cancer    Asthma, not well controlled 02/12/2020   History of frequent upper respiratory infection 02/12/2020   Adverse reaction to food, subsequent encounter 02/12/2020   Drug reaction 02/12/2020   Heartburn 02/12/2020   History of systemic reaction to hymenoptera sting 02/12/2020   History of urticaria 02/12/2020   Numbness and tingling in left arm 01/16/2020   Other allergic rhinitis 01/08/2020   Migraine without aura and without status migrainosus, not intractable 08/23/2017   Generalized anxiety disorder 05/22/2017   Other acquired deformity of hip 05/22/2017   Pyelonephritis 05/17/2017   Attention deficit hyperactivity disorder (ADHD), 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   Vitamin D deficiency 01/12/2016   B12 deficiency 01/12/2016   Status post arthroscopy of hip 12/25/2015   Left hip pain 03/04/2015   Labral tear of hip, degenerative 02/04/2015   Quadriceps tendinitis 01/06/2015   Femoral neck stress fracture 01/06/2015   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 Authorizing Provider  cephALEXin (KEFLEX) 500 MG capsule Take 2 capsules (1,000 mg total) by mouth 2 (two) times daily. 08/04/20  Yes Mikiyah Glasner, Charline Bills, PA-C  dicyclomine (BENTYL) 10 MG capsule Take 1 capsule (10 mg total) by mouth 4  (four) times daily as needed for up to 14 days for spasms (abdominal pain). 08/04/20 08/18/20 Yes Teri Diltz, Charline Bills, PA-C  promethazine (PHENERGAN) 25 MG tablet Take 1 tablet (25 mg total) by mouth every 6 (six) hours as needed for nausea or vomiting. 08/04/20  Yes Lafaye Mcelmurry, Charline Bills, PA-C  albuterol (VENTOLIN HFA) 108 (90 Base) MCG/ACT inhaler Inhale 2 puffs into the lungs every 6 (six) hours as needed for wheezing or shortness of breath. 01/08/20   Lauraine Rinne, NP  amphetamine-dextroamphetamine (ADDERALL XR) 15 MG 24 hr capsule Take by mouth. 03/11/20   [provider]  budesonide-formoterol (SYMBICORT) 160-4.5 MCG/ACT inhaler Inhale 2 puffs into the lungs 2 (two) times daily. 01/08/20   Lauraine Rinne, NP  busPIRone (BUSPAR) 7.5 MG tablet Take 7.5 mg by mouth 2 (two) times daily. 10/20/19   [provider]  cetirizine (ZYRTEC) 10 MG tablet Take 10 mg by mouth daily. 04/17/17   [provider]  ciclopirox (LOPROX) 0.77 % cream Apply topically 2 (two) times daily. To feet for 3-4 weeks then once weekly 02/06/20   Moye, [place], MD  cyanocobalamin (,VITAMIN B-12,) 1000 MCG/ML injection INJECT 1 ML (1,000 MCG TOTAL) INTO THE MUSCLE EVERY 30 (THIRTY) DAYS. 12/11/17   Shambley, Melody N, CNM  dexmethylphenidate (FOCALIN) 2.5 MG tablet Take 2.5 mg by mouth daily. 01/30/17   [provider]  dicyclomine (BENTYL) 10 MG capsule TAKE 1 CAPSULE (10 MG TOTAL) BY MOUTH 3 (THREE) TIMES DAILY WITH MEALS AS NEEDED FOR SPASMS. 04/13/20 05/13/20  Lin Landsman, MD  EPINEPHrine (AUVI-Q) 0.3 mg/0.3 mL IJ SOAJ injection Inject 0.3 mg into the muscle as needed for anaphylaxis. 02/12/20   Garnet Sierras, DO  fluticasone (FLONASE) 50 MCG/ACT nasal spray Place 1 spray into both nostrils 2 (two) times daily as needed for allergies. 02/12/20   Garnet Sierras, DO  gabapentin (NEURONTIN) 100 MG capsule Take by mouth. 01/16/20   [provider]  Galcanezumab-gnlm (EMGALITY) 120 MG/ML SOAJ Inject  into the skin. 01/20/20   [provider]  hydrocortisone 2.5 % cream Apply to affected areas left eye area twice a day as needed for up to 1 week. 05/21/20   Moye, [place], MD  hydrOXYzine (ATARAX/VISTARIL) 25 MG tablet Take 25 mg by mouth daily. 12/25/19   [provider]  ketoconazole (NIZORAL) 2 % cream Apply topically. 11/24/19   [provider]  levonorgestrel (MIRENA) 20 MCG/24HR IUD by Intrauterine route. 01/14/20   [provider]  montelukast (SINGULAIR) 10 MG tablet Take by mouth. 04/14/20 04/14/21  [provider]  mupirocin ointment (BACTROBAN) 2 % Apply 1 application topically daily. 03/04/20   Moye, [place], MD  Olopatadine HCl 0.2 % SOLN Apply 1 drop to eye daily as needed (itchy/watery eyes). Do not put directly over contacts. Wait 15 minutes. 02/12/20   Garnet Sierras, DO  ondansetron (ZOFRAN-ODT) 4 MG disintegrating tablet Take 1 tablet (4 mg total) by mouth every 8 (eight) hours as needed for nausea or vomiting.  10/30/19   Avagrace Botelho, Charline Bills, PA-C  rizatriptan (MAXALT-MLT) 10 MG disintegrating tablet Take 10 mg by mouth as directed. TAKE 1 TAB AT HEADACHE ONSET MAY TAKE A 2ND DOSE AFTER 2 HOURS IF NEEDED NO MORE THAN 2 DOSES/24 HRS 04/25/17   [provider]  terbinafine (LAMISIL) 250 MG tablet Take 1 by mouth as directed. 03/11/20   Moye, [place], MD  venlafaxine XR (EFFEXOR-XR) 75 MG 24 hr capsule Take by mouth. 03/04/20 03/04/21  [provider]  Vitamin D, Ergocalciferol, (DRISDOL) 1.25 MG (50000 UNIT) CAPS capsule Take 1 capsule (50,000 Units total) by mouth every 7 (seven) days. 12/31/19   Philip Aspen, CNM    Allergies Doxycycline and Amoxicillin-pot clavulanate  Family History  Problem Relation Age of Onset   Diabetes Mellitus II Father    Cancer Father    Colon cancer Father    Cancer Paternal Grandfather    Lung cancer Paternal Grandfather     Social History Social History   Tobacco Use   Smoking status:  Never   Smokeless tobacco: Never  Vaping Use   Vaping Use: Never used  Substance Use Topics   Alcohol use: Yes    Alcohol/week: 0.0 standard drinks    Comment: occas   Drug use: No     Review of Systems  Constitutional: No fever/chills Eyes: No visual changes. No discharge ENT: No upper respiratory complaints. Cardiovascular: no chest pain. Respiratory: no cough. No SOB. Gastrointestinal: No abdominal pain.  No nausea, no vomiting.  No diarrhea.  No constipation. Genitourinary: Negative for dysuria. No hematuria Musculoskeletal: Negative for musculoskeletal pain. Skin: Negative for rash, abrasions, lacerations, ecchymosis. Neurological: Negative for headaches, focal weakness or numbness.  10 System ROS otherwise negative.  ____________________________________________   PHYSICAL EXAM:  VITAL SIGNS: ED Triage Vitals  Enc Vitals Group     BP 08/04/20 1613 (!) 113/95     Pulse Rate 08/04/20 1613 99     Resp 08/04/20 1613 16     Temp 08/04/20 1613 98 F (36.7 C)     Temp Source 08/04/20 1613 Oral     SpO2 08/04/20 1613 99 %     Weight 08/04/20 1615 157 lb (71.2 kg)     Height 08/04/20 1615 '5\' 3"'$  (1.6 m)     Head Circumference --      Peak Flow --      Pain Score 08/04/20 1614 8     Pain Loc --      Pain Edu? --      Excl. in [place]? --      Constitutional: Alert and oriented. Well appearing and in no acute distress. Eyes: Conjunctivae are normal. PERRL. EOMI. Head: Atraumatic. ENT:      Ears:       Nose: No congestion/rhinnorhea.      Mouth/Throat: Mucous membranes are moist.  Neck: No stridor.   Cardiovascular: Normal rate, regular rhythm. Normal S1 and S2.  Good peripheral circulation. Respiratory: Normal respiratory effort without tachypnea or retractions. Lungs CTAB. Good air entry to the bases with no decreased or absent breath sounds. Gastrointestinal: Bowel sounds 4 quadrants.  Soft to palpation all quadrants.  Patient is tender in the right upper quadrant  with mild guarding.  No rigidity. No palpable masses. No distention. No CVA tenderness. Musculoskeletal: Full range of motion to all extremities. No gross deformities appreciated. Neurologic:  Normal speech and language. No gross focal neurologic deficits are appreciated.  Skin:  Skin is warm, dry and intact.  No rash noted. Psychiatric: Mood and affect are normal. Speech and behavior are normal. Patient exhibits appropriate insight and judgement.   ____________________________________________   LABS (all labs ordered are listed, but only abnormal results are displayed)  Labs Reviewed  COMPREHENSIVE METABOLIC PANEL - Abnormal; Notable for the following components:      Result Value   Calcium 8.8 (*)    All other components within normal limits  URINALYSIS, COMPLETE (UACMP) WITH MICROSCOPIC - Abnormal; Notable for the following components:   Color, Urine YELLOW (*)    APPearance HAZY (*)    Hgb urine dipstick SMALL (*)    Leukocytes,Ua TRACE (*)    Bacteria, UA RARE (*)    All other components within normal limits  LIPASE, BLOOD  CBC  PREGNANCY, URINE  POC URINE PREG, ED   ____________________________________________  EKG   ____________________________________________  RADIOLOGY I personally viewed and evaluated these images as part of my medical decision making, as well as reviewing the written report by the radiologist.  ED Provider Interpretation: No acute findings in the GI tract including evidence of cholecystitis, colitis.  Patient had findings in the right urinary tract with some enlargement of the renal pelvis, mild urothelial thickening and enhancement of the bladder wall.  CT ABDOMEN PELVIS W CONTRAST  Result Date: 08/04/2020 CLINICAL DATA:  Recurrent right upper quadrant abdominal pain with nausea, vomiting and diarrhea EXAM: CT ABDOMEN AND PELVIS WITH CONTRAST TECHNIQUE: Multidetector CT imaging of the abdomen and pelvis was performed using the standard protocol  following bolus administration of intravenous contrast. CONTRAST:  71m OMNIPAQUE IOHEXOL 350 MG/ML SOLN COMPARISON:  CT 05/17/2017, renal ultrasound 05/19/2017, right upper quadrant ultrasound 05/17/2017 FINDINGS: Lower chest: Lung bases are clear. Normal heart size. No pericardial effusion. Hepatobiliary: No worrisome focal liver lesions. Smooth liver surface contour. Normal hepatic attenuation. Normal gallbladder and biliary tree p.o. Pancreas: No pancreatic ductal dilatation or surrounding inflammatory changes. Spleen: Normal in size. No concerning splenic lesions. Adrenals/Urinary Tract: Normal adrenals. The kidneys enhance symmetrically and uniformly. No worrisome renal lesions. Few subcentimeter hypoattenuating foci are too small to fully characterize though statistically likely benign. There is asymmetric prominence of the right renal collecting system and mild urothelial thickening and enhancement as well as circumferential bladder wall thickening which is greater than expected for mere underdistention. No obstructive urolithiasis. Left urinary tract appears grossly unremarkable. Stomach/Bowel: Distal esophagus, stomach and duodenal sweep are unremarkable. No small bowel wall thickening or dilatation. No evidence of obstruction. A normal appendix is visualized. No colonic dilatation or wall thickening. Vascular/Lymphatic: No significant vascular findings are present. No enlarged abdominal or pelvic lymph nodes. Reproductive: Anteverted uterus. Expected positioning of the radiopaque IUD. No concerning adnexal lesion. Other: No abdominopelvic free fluid or free gas. No bowel containing hernias. Musculoskeletal: No acute osseous abnormality or suspicious osseous lesion. IMPRESSION: Asymmetric prominence of the right renal collecting system with urothelial thickening and a circumferentially thickened bladder wall with perivesicular hazy stranding. Appearance most consistent with an ascending tract  infection/pyelitis without convincing features of frank pyelonephritis at this time. No obstructive urolithiasis is seen at this time. Recommend correlation with urinalysis. Electronically Signed   By: PLovena LeM.D.   On: 08/04/2020 19:50    ____________________________________________    PROCEDURES  Procedure(s) performed:    Procedures    Medications  HYDROmorphone (DILAUDID) injection 1 mg (has no administration in time range)  sodium chloride 0.9 % bolus 1,000 mL (0 mLs Intravenous Stopped 08/04/20 2011)  morphine 4 MG/ML injection  4 mg (4 mg Intravenous Given 08/04/20 1820)  ondansetron (ZOFRAN-ODT) disintegrating tablet 8 mg (8 mg Oral Given 08/04/20 1820)  diphenhydrAMINE (BENADRYL) injection 25 mg (25 mg Intravenous Given 08/04/20 1857)  iohexol (OMNIPAQUE) 350 MG/ML injection 75 mL (75 mLs Intravenous Contrast Given 08/04/20 1827)  dicyclomine (BENTYL) capsule 10 mg (10 mg Oral Given 08/04/20 1921)  fentaNYL (SUBLIMAZE) injection 50 mcg (50 mcg Intravenous Given 08/04/20 1921)     ____________________________________________   INITIAL IMPRESSION / ASSESSMENT AND PLAN / ED COURSE  Pertinent labs & imaging results that were available during my care of the patient were reviewed by me and considered in my medical decision making (see chart for details).  Review of the [place] CSRS was performed in accordance of the [place] prior to dispensing any controlled drugs.           Patient's diagnosis is consistent with biliary colic.  Patient presented to the emergency department with intense pain/cramping sensation in the right upper quadrant.  Patient states that she has had episodes where she will have this pain and cramping on the right upper quadrant followed by nausea and emesis.  This typically is following eating.  She states that she tries to eat bland and she has been diagnosed with IBS but states that she feels like the symptoms are too sudden to sharp for her to be IBS.   Patient has had no fevers or chills.  She denied any dysuria, polyuria, hematuria, vaginal bleeding or discharge.  She did have some nausea, vomiting and diarrhea.  Again patient states that this is happened multiple times.  She has been seen by GI with negative colonoscopy, as well as testing for food allergies such as celiac's and lactose intolerance.  Labs were relatively reassuring.  Patient has small amount of hemoglobin and white blood cells on her urine but labs were otherwise relatively reassuring.  Given the findings patient had CT scan which did reveal some nonspecific changes in the right urinary tract.  Patient states that she has had frequent UTIs in the past, states that she has none of her similar symptoms.  She had no CVA tenderness.  Given the findings in her urine I did discuss the potential for possible UTI.  I will prescribe antibiotics but I think that this is unlikely the patient's source of symptoms.  We discussed holding the antibiotics unless patient has symptoms to include fevers and chills, right-sided back pain, dysuria, polyuria or hematuria.  Patient is having tenderness in the right upper quadrant, nausea, vomiting and 45 minutes of eating.  With no evidence of cholelithiasis or cholecystitis on imaging I feel that this is likely biliary colic.  Follow-up with general surgery for this.  Return precautions discussed at length with the patient.  Patient will have symptom control medications at home in addition to the prescribed antibiotics. Patient is given ED precautions to return to the ED for any worsening or new symptoms.     ____________________________________________  FINAL CLINICAL IMPRESSION(S) / ED DIAGNOSES  Final diagnoses:  Biliary colic      NEW MEDICATIONS STARTED DURING THIS VISIT:  ED Discharge Orders          Ordered    cephALEXin (KEFLEX) 500 MG capsule  2 times daily        08/04/20 2049    dicyclomine (BENTYL) 10 MG capsule  4 times daily PRN         08/04/20 2049    promethazine (PHENERGAN) 25 MG tablet  Every 6 hours PRN        08/04/20 2049                This chart was dictated using voice recognition software/Dragon. Despite best efforts to proofread, errors can occur which can change the meaning. Any change was purely unintentional.    Darletta Moll, PA-C 08/04/20 2049    Nance Pear, MD 08/04/20 2059

## 2020-08-05 LAB — POC URINE PREG, ED: Preg Test, Ur: NEGATIVE

## 2020-08-08 ENCOUNTER — Other Ambulatory Visit: Payer: Self-pay

## 2020-08-08 ENCOUNTER — Emergency Department: Payer: BC Managed Care – PPO

## 2020-08-08 ENCOUNTER — Emergency Department
Admission: EM | Admit: 2020-08-08 | Discharge: 2020-08-08 | Disposition: A | Discharge status: Home / Self Care | Payer: BC Managed Care – PPO | Attending: Emergency Medicine | Admitting: Emergency Medicine

## 2020-08-08 DIAGNOSIS — R1011 Right upper quadrant pain: Secondary | ICD-10-CM

## 2020-08-08 DIAGNOSIS — N12 Tubulo-interstitial nephritis, not specified as acute or chronic: Secondary | ICD-10-CM | POA: Insufficient documentation

## 2020-08-08 DIAGNOSIS — Z85038 Personal history of other malignant neoplasm of large intestine: Secondary | ICD-10-CM | POA: Insufficient documentation

## 2020-08-08 DIAGNOSIS — Z7951 Long term (current) use of inhaled steroids: Secondary | ICD-10-CM | POA: Insufficient documentation

## 2020-08-08 DIAGNOSIS — J45909 Unspecified asthma, uncomplicated: Secondary | ICD-10-CM | POA: Insufficient documentation

## 2020-08-08 LAB — COMPREHENSIVE METABOLIC PANEL
ALT: 14 U/L (ref 0–44)
AST: 19 U/L (ref 15–41)
Albumin: 4.2 g/dL (ref 3.5–5.0)
Alkaline Phosphatase: 84 U/L (ref 38–126)
Anion gap: 7 (ref 5–15)
BUN: 12 mg/dL (ref 6–20)
CO2: 29 mmol/L (ref 22–32)
Calcium: 9.4 mg/dL (ref 8.9–10.3)
Chloride: 101 mmol/L (ref 98–111)
Creatinine, Ser: 0.73 mg/dL (ref 0.44–1.00)
GFR, Estimated: >60 mL/min (ref >60)
Glucose, Bld: 102 mg/dL — ABNORMAL HIGH (ref 70–99)
Potassium: 3.9 mmol/L (ref 3.5–5.1)
Sodium: 137 mmol/L (ref 135–145)
Total Bilirubin: 0.8 mg/dL (ref 0.3–1.2)
Total Protein: 7.4 g/dL (ref 6.5–8.1)

## 2020-08-08 LAB — URINALYSIS, COMPLETE (UACMP) WITH MICROSCOPIC
Bilirubin Urine: NEGATIVE
Glucose, UA: NEGATIVE mg/dL
Ketones, ur: NEGATIVE mg/dL
Nitrite: NEGATIVE
Protein, ur: 30 mg/dL — AB
Specific Gravity, Urine: 1.024 (ref 1.005–1.030)
pH: 7 (ref 5.0–8.0)

## 2020-08-08 LAB — LIPASE, BLOOD: Lipase: 24 U/L (ref 11–51)

## 2020-08-08 LAB — CBC
HCT: 40.9 % (ref 36.0–46.0)
Hemoglobin: 13.4 g/dL (ref 12.0–15.0)
MCH: 28.6 pg (ref 26.0–34.0)
MCHC: 32.8 g/dL (ref 30.0–36.0)
MCV: 87.4 fL (ref 80.0–100.0)
Platelets: 220 10*3/uL (ref 150–400)
RBC: 4.68 MIL/uL (ref 3.87–5.11)
RDW: 14.1 % (ref 11.5–15.5)
WBC: 4.6 10*3/uL (ref 4.0–10.5)
nRBC: 0 % (ref 0.0–0.2)

## 2020-08-08 LAB — POC URINE PREG, ED: Preg Test, Ur: NEGATIVE

## 2020-08-08 MED ORDER — SUCRALFATE 1 G PO TABS
1.0000 g | ORAL_TABLET | Freq: Once | ORAL | Status: AC
  Administered 2020-08-08: 1 g via ORAL
  Filled 2020-08-08: qty 1

## 2020-08-08 MED ORDER — SUCRALFATE 1 G PO TABS: 1.0000 g | ORAL_TABLET | Freq: Four times a day (QID) | ORAL | 0 refills | Status: DC

## 2020-08-08 MED ORDER — DIPHENHYDRAMINE HCL 25 MG PO CAPS
25.0000 mg | ORAL_CAPSULE | Freq: Once | ORAL | Status: AC
  Administered 2020-08-08: 25 mg via ORAL
  Filled 2020-08-08: qty 1

## 2020-08-08 MED ORDER — ONDANSETRON 4 MG PO TBDP
4.0000 mg | ORAL_TABLET | Freq: Once | ORAL | Status: AC
  Administered 2020-08-08: 4 mg via ORAL
  Filled 2020-08-08: qty 1

## 2020-08-08 MED ORDER — ACETAMINOPHEN 325 MG PO TABS
650.0000 mg | ORAL_TABLET | Freq: Once | ORAL | Status: AC
  Administered 2020-08-08: 650 mg via ORAL
  Filled 2020-08-08: qty 2

## 2020-08-08 MED ORDER — CEFTRIAXONE SODIUM 1 G IJ SOLR
1.0000 g | Freq: Once | INTRAMUSCULAR | Status: AC
  Administered 2020-08-08: 1 g via INTRAMUSCULAR
  Filled 2020-08-08: qty 10

## 2020-08-08 MED ORDER — OXYCODONE-ACETAMINOPHEN 5-325 MG PO TABS
1.0000 | ORAL_TABLET | Freq: Once | ORAL | Status: AC
  Administered 2020-08-08: 1 via ORAL
  Filled 2020-08-08: qty 1

## 2020-08-08 MED ORDER — CIPROFLOXACIN HCL 500 MG PO TABS
500.0000 mg | ORAL_TABLET | Freq: Once | ORAL | Status: AC
  Administered 2020-08-08: 500 mg via ORAL
  Filled 2020-08-08: qty 1

## 2020-08-08 MED ORDER — LIDOCAINE HCL (PF) 1 % IJ SOLN
INTRAMUSCULAR | Status: AC
  Administered 2020-08-08: 2.1 mL
  Filled 2020-08-08: qty 5

## 2020-08-08 MED ORDER — ONDANSETRON 4 MG PO TBDP: 4.0000 mg | ORAL_TABLET | Freq: Three times a day (TID) | ORAL | 0 refills | Status: DC | PRN

## 2020-08-08 MED ORDER — CIPROFLOXACIN HCL 500 MG PO TABS: 500.0000 mg | ORAL_TABLET | Freq: Two times a day (BID) | ORAL | 0 refills | Status: AC

## 2020-08-08 NOTE — ED Triage Notes (Signed)
Pt presents via POV c/o RUQ pain extending into back and dysuria. Reports seen here on Tuesday and given abx. Started abx yesterday per pt report. Ambulatory to triage.

## 2020-08-08 NOTE — Discharge Instructions (Addendum)
Please take the ciprofloxacin, antibiotic twice daily as prescribed.  You can try the Carafate for your upper abdominal pain to see if this is related to gastritis.  Use as prescribed.  Please keep your follow-up with Dr. Dahlia Byes for general surgery, return to the ER with any worsening.

## 2020-08-08 NOTE — ED Notes (Signed)
When going to administer Percocet, patient reported extreme itchiness when previously receiving and requested Benadryl. No previous serious reactions such as hives, trouble breathing reported.

## 2020-08-08 NOTE — ED Provider Notes (Signed)
[facility] Emergency Department Provider Note  ____________________________________________   Event Date/Time   First MD Initiated Contact with Patient 08/08/20 1716     (approximate)  I have reviewed the triage vital signs and the nursing notes.   HISTORY  Chief Complaint Abdominal Pain and Dysuria  HPI Angela Rocha is a 25 y.o. female who presents to the emergency department for evaluation of right upper quadrant pain, dysuria and right sided back pain.  She reports she was seen 4 days ago, diagnosed with biliary colic but also given the antibiotics for a UTI if she developed any urinary symptoms.  She states that she began the Keflex yesterday morning due to developing urinary frequency, but continues to deny dysuria.  Today, she developed severe right-sided back pain, fever of 101.5 that was treated with ibuprofen prior to arrival.  She also reports persistent right upper quadrant pain and nausea that has failed improvement.  She was diagnosed with biliary colic 4 days ago and has an appointment set up to see Dr. Dahlia Byes in 3 days.  She denies any chills, body aches, diarrhea.  She does report extensive history of abdominal pain of uncertain etiology and was diagnosed with IBS, but states that the last 4 days have been different than her norm.  She does report history of STDs, last treated several months ago for chlamydia.  States partner was also tested and she had repeat test of cure.  She denies any current concern for STDs.         Past Medical History:  Diagnosis Date   ADHD    Anxiety    Asthma    History of dysplastic nevus 03/04/2020   right upper back, severe/excision   Migraine    Pelvic congestion syndrome    Recurrent upper respiratory infection (URI)    Urticaria     Patient Active Problem List   Diagnosis Date Noted   Pain from implanted hardware 03/23/2020   History of colon cancer    Asthma, not well controlled 02/12/2020    History of frequent upper respiratory infection 02/12/2020   Adverse reaction to food, subsequent encounter 02/12/2020   Drug reaction 02/12/2020   Heartburn 02/12/2020   History of systemic reaction to hymenoptera sting 02/12/2020   History of urticaria 02/12/2020   Numbness and tingling in left arm 01/16/2020   Other allergic rhinitis 01/08/2020   Migraine without aura and without status migrainosus, not intractable 08/23/2017   Generalized anxiety disorder 05/22/2017   Other acquired deformity of hip 05/22/2017   Pyelonephritis 05/17/2017   Attention deficit hyperactivity disorder (ADHD), 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   Vitamin D deficiency 01/12/2016   B12 deficiency 01/12/2016   Status post arthroscopy of hip 12/25/2015   Left hip pain 03/04/2015   Labral tear of hip, degenerative 02/04/2015   Quadriceps tendinitis 01/06/2015   Femoral neck stress fracture 01/06/2015   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 Authorizing Provider  ciprofloxacin (CIPRO) 500 MG tablet Take 1 tablet (  500 mg total) by mouth 2 (two) times daily for 10 days. 08/08/20 08/18/20 Yes Harriett Azar, Farrel Gordon, PA  ondansetron (ZOFRAN ODT) 4 MG disintegrating tablet Take 1 tablet (4 mg total) by mouth every 8 (eight) hours as needed for nausea or vomiting. 08/08/20  Yes Marlana Salvage, PA  sucralfate (CARAFATE) 1 g tablet Take 1 tablet (1 g total) by mouth 4 (four) times daily. 08/08/20 09/07/20 Yes Iley Deignan, Farrel Gordon, PA  albuterol (VENTOLIN HFA) 108 (90 Base) MCG/ACT  inhaler Inhale 2 puffs into the lungs every 6 (six) hours as needed for wheezing or shortness of breath. 01/08/20   Lauraine Rinne, NP  amphetamine-dextroamphetamine (ADDERALL XR) 15 MG 24 hr capsule Take by mouth. 03/11/20   [provider]  budesonide-formoterol (SYMBICORT) 160-4.5 MCG/ACT inhaler Inhale 2 puffs into the lungs 2 (two) times daily. 01/08/20   Lauraine Rinne, NP  busPIRone (BUSPAR) 7.5 MG tablet Take 7.5 mg by mouth 2 (two) times daily. 10/20/19   [provider]  cephALEXin (KEFLEX) 500 MG capsule Take 2 capsules (1,000 mg total) by mouth 2 (two) times daily. 08/04/20   Cuthriell, Charline Bills, PA-C  cetirizine (ZYRTEC) 10 MG tablet Take 10 mg by mouth daily. 04/17/17   [provider]  ciclopirox (LOPROX) 0.77 % cream Apply topically 2 (two) times daily. To feet for 3-4 weeks then once weekly 02/06/20   Moye, [place], MD  cyanocobalamin (,VITAMIN B-12,) 1000 MCG/ML injection INJECT 1 ML (1,000 MCG TOTAL) INTO THE MUSCLE EVERY 30 (THIRTY) DAYS. 12/11/17   Shambley, Melody N, CNM  dexmethylphenidate (FOCALIN) 2.5 MG tablet Take 2.5 mg by mouth daily. 01/30/17   [provider]  dicyclomine (BENTYL) 10 MG capsule TAKE 1 CAPSULE (10 MG TOTAL) BY MOUTH 3 (THREE) TIMES DAILY WITH MEALS AS NEEDED FOR SPASMS. 04/13/20 05/13/20  Lin Landsman, MD  dicyclomine (BENTYL) 10 MG capsule Take 1 capsule (10 mg total) by mouth 4 (four) times daily as needed for up to 14 days for spasms (abdominal pain). 08/04/20 08/18/20  Cuthriell, Charline Bills, PA-C  EPINEPHrine (AUVI-Q) 0.3 mg/0.3 mL IJ SOAJ injection Inject 0.3 mg into the muscle as needed for anaphylaxis. 02/12/20   Garnet Sierras, DO  fluticasone (FLONASE) 50 MCG/ACT nasal spray Place 1 spray into both nostrils 2 (two) times daily as needed for allergies. 02/12/20   Garnet Sierras, DO  gabapentin (NEURONTIN) 100 MG capsule Take by mouth. 01/16/20   [provider]  Galcanezumab-gnlm (EMGALITY) 120 MG/ML SOAJ Inject into the  skin. 01/20/20   [provider]  hydrocortisone 2.5 % cream Apply to affected areas left eye area twice a day as needed for up to 1 week. 05/21/20   Moye, [place], MD  hydrOXYzine (ATARAX/VISTARIL) 25 MG tablet Take 25 mg by mouth daily. 12/25/19   [provider]  ketoconazole (NIZORAL) 2 % cream Apply topically. 11/24/19   [provider]  levonorgestrel (MIRENA) 20 MCG/24HR IUD by Intrauterine route. 01/14/20   [provider]  montelukast (SINGULAIR) 10 MG tablet Take by mouth. 04/14/20 04/14/21  [provider]  mupirocin ointment (BACTROBAN) 2 % Apply 1 application topically daily. 03/04/20   Moye, [place], MD  Olopatadine HCl 0.2 % SOLN Apply 1 drop to eye daily as needed (itchy/watery eyes). Do not put directly over contacts. Wait 15 minutes. 02/12/20   Garnet Sierras, DO  promethazine (PHENERGAN) 25 MG tablet Take 1 tablet (25 mg total) by mouth every 6 (six) hours as needed  for nausea or vomiting. 08/04/20   Cuthriell, Charline Bills, PA-C  rizatriptan (MAXALT-MLT) 10 MG disintegrating tablet Take 10 mg by mouth as directed. TAKE 1 TAB AT HEADACHE ONSET MAY TAKE A 2ND DOSE AFTER 2 HOURS IF NEEDED NO MORE THAN 2 DOSES/24 HRS 04/25/17   [provider]  terbinafine (LAMISIL) 250 MG tablet Take 1 by mouth as directed. 03/11/20   Moye, [place], MD  venlafaxine XR (EFFEXOR-XR) 75 MG 24 hr capsule Take by mouth. 03/04/20 03/04/21  [provider]  Vitamin D, Ergocalciferol, (DRISDOL) 1.25 MG (50000 UNIT) CAPS capsule Take 1 capsule (50,000 Units total) by mouth every 7 (seven) days. 12/31/19   Philip Aspen, CNM    Allergies Doxycycline and Amoxicillin-pot clavulanate  Family History  Problem Relation Age of Onset   Diabetes Mellitus II Father    Cancer Father    Colon cancer Father    Cancer Paternal Grandfather    Lung cancer Paternal Grandfather     Social History Social History   Tobacco Use   Smoking status: Never   Smokeless  tobacco: Never  Vaping Use   Vaping Use: Never used  Substance Use Topics   Alcohol use: Yes    Alcohol/week: 0.0 standard drinks    Comment: occas   Drug use: No    Review of Systems Constitutional: + fever, nochills Eyes: No visual changes. ENT: No sore throat. Cardiovascular: Denies chest pain. Respiratory: Denies shortness of breath. Gastrointestinal: + Right upper quadrant abdominal pain.  + nausea, + vomiting.  No diarrhea.  No constipation. Genitourinary: + Urinary frequency, negative for dysuria. Musculoskeletal: + Right-sided back pain Skin: Negative for rash. Neurological: Negative for headaches, focal weakness or numbness.   ____________________________________________   PHYSICAL EXAM:  VITAL SIGNS: ED Triage Vitals  Enc Vitals Group     BP 08/08/20 1512 125/86     Pulse Rate 08/08/20 1512 89     Resp 08/08/20 1512 16     Temp 08/08/20 1512 98.1 F (36.7 C)     Temp Source 08/08/20 1512 Oral     SpO2 08/08/20 1512 95 %     Weight --      Height --      Head Circumference --      Peak Flow --      Pain Score 08/08/20 1510 6     Pain Loc --      Pain Edu? --      Excl. in [place]? --    Constitutional: Alert and oriented. Well appearing and in no acute distress. Eyes: Conjunctivae are normal. PERRL. EOMI. Head: Atraumatic. Nose: No congestion/rhinnorhea. Mouth/Throat: Mucous membranes are moist.  Oropharynx non-erythematous. Neck: No stridor.   Cardiovascular: Normal rate, regular rhythm. Grossly normal heart sounds.  Good peripheral circulation. Respiratory: Normal respiratory effort.  No retractions. Lungs CTAB. Gastrointestinal: Tenderness to palpation of the right upper quadrant with mild voluntary guarding.  Rebound tenderness present.  No tenderness to palpation of the remainder of the abdomen.  No distention. No abdominal bruits. + Right-sided CVA tenderness, no left-sided CVA tenderness Musculoskeletal: No lower extremity tenderness nor edema.  No  joint effusions. Neurologic:  Normal speech and language. No gross focal neurologic deficits are appreciated. No gait instability. Skin:  Skin is warm, dry and intact. No rash noted. Psychiatric: Mood and affect are normal. Speech and behavior are normal.  ____________________________________________   LABS (all labs ordered are listed, but only abnormal results are displayed)  Labs Reviewed  COMPREHENSIVE METABOLIC  PANEL - Abnormal; Notable for the following components:      Result Value   Glucose, Bld 102 (*)    All other components within normal limits  URINALYSIS, COMPLETE (UACMP) WITH MICROSCOPIC - Abnormal; Notable for the following components:   Color, Urine YELLOW (*)    APPearance CLOUDY (*)    Hgb urine dipstick SMALL (*)    Protein, ur 30 (*)    Leukocytes,Ua TRACE (*)    Bacteria, UA MANY (*)    All other components within normal limits  LIPASE, BLOOD  CBC  POC URINE PREG, ED   ____________________________________________  RADIOLOGY  Official radiology report(s): US Renal  Result Date: 08/08/2020 CLINICAL DATA:  Right flank pain for 1 week EXAM: RENAL / URINARY TRACT ULTRASOUND COMPLETE COMPARISON:  CT abdomen pelvis 08/04/2020 FINDINGS: Right Kidney: Renal measurements: 8.9 x 3.8 x 4.8 cm = volume: 82 mL. Echogenicity within normal limits. No mass or hydronephrosis visualized. Left Kidney: Renal measurements: 9.5 x 4.9 x 3.4 cm = volume: 82 mL. Echogenicity within normal limits. No mass or hydronephrosis visualized. Bladder: Bladder wall appears mildly thickened but also under distended. Other: None. IMPRESSION: 1.  No acute finding in the bilateral kidneys. 2. Bladder wall appears mildly thickened but also under distended. Recommend correlation with urinalysis if clinically indicated. Electronically Signed   By: Audie Pinto M.D.   On: 08/08/2020 18:40   US Abdomen Limited RUQ (LIVER/GB)  Result Date: 08/08/2020 CLINICAL DATA:  Chronic right upper quadrant  pain EXAM: ULTRASOUND ABDOMEN LIMITED RIGHT UPPER QUADRANT COMPARISON:  CT abdomen pelvis 08/04/2020 FINDINGS: Gallbladder: No gallstones or wall thickening visualized. No sonographic Murphy sign noted by sonographer. Common bile duct: Diameter: 0.2 cm, within normal limits Liver: No focal lesion identified. Within normal limits in parenchymal echogenicity. Portal vein is patent on color Doppler imaging with normal direction of blood flow towards the liver. Other: None. IMPRESSION: No sonographic finding to explain the patient's right upper quadrant pain. Electronically Signed   By: Audie Pinto M.D.   On: 08/08/2020 17:29     ____________________________________________   INITIAL IMPRESSION / ASSESSMENT AND PLAN / ED COURSE  As part of my medical decision making, I reviewed the following data within the [facility] notes reviewed and incorporated, Labs reviewed, and Notes from prior ED visits        Patient is a 24 year old female who presents to the emergency department for persistent right upper quadrant pain and now associated fever and flank pain with urinary frequency.  She was seen 4 days ago, received a CT and was diagnosed with biliary colic but was given antibiotics in case of urinary symptoms.  She began this yesterday morning after having urinary frequency, has taken 36 hours worth of the Keflex with worsening of her pain.  See HPI for further details.  In triage, patient is afebrile here, admittedly with ibuprofen on board, remainder of vitals are within normal limits.  Laboratory evaluation including CBC with no significant abnormalities, CMP within normal limits, lipase within normal limits.  Urinalysis demonstrates small amount of hemoglobin, proteinuria, many bacteria with trace leukocytes.  Differentials considered include complicated UTI/pyelonephritis, cholecystitis, gallstone/biliary colic, pancreatitis, gastritis, other.  Patient is having  worsening urinary symptoms despite 36 hours on Keflex.  She also has changes in her urinalysis to now include many bacteria compared to rare 4 days ago.  CT from 4 days ago was reviewed and demonstrates concern for possible a sending urinary infection.  Renal  ultrasound performed today without any acute abnormalities.  We will send this urine for culture, though it may be skewed by her current Keflex on board.  Given her fever with CVA tenderness, will proceed for treatment for pyelonephritis with Rocephin, followed by ciprofloxacin.  Patient has history of hospital admission for pyelonephritis and was treated with Cipro, has had previous urine culture sensitive to Cipro.  In regards to right upper quadrant pain, CT with contrast 4 days ago did not show any significant abnormalities regarding intra-abdominal pathology, she has no leukocytosis.  Ultrasound of the right upper quadrant was obtained and is negative for any evidence of cholecystitis, gallstones or other acute changes.  She also has no changes in her liver enzymes or bilirubin to suggest obstructed stone.  Lipase is normal with no evidence of pancreatitis.  Feel that this may be related to gastritis with recent NSAID use versus relation to her IBS versus other GI pathology.  Recommended Carafate for treatment of gastritis, continued Zofran while receiving treatment for the pyelonephritis.  I discussed with the patient the importance of continuing her follow-up with general surgery to receive their recommendations.  Patient is amenable with this plan, return precautions were discussed, she stable this time for outpatient follow-up.      ____________________________________________   FINAL CLINICAL IMPRESSION(S) / ED DIAGNOSES  Final diagnoses:  RUQ pain  Pyelonephritis     ED Discharge Orders          Ordered    sucralfate (CARAFATE) 1 g tablet  4 times daily        08/08/20 1903    ondansetron (ZOFRAN ODT) 4 MG disintegrating tablet   Every 8 hours PRN        08/08/20 1903    ciprofloxacin (CIPRO) 500 MG tablet  2 times daily        08/08/20 1903             Note:  This document was prepared using Dragon voice recognition software and may include unintentional dictation errors.    Marlana Salvage, PA 08/08/20 2100    Lucrezia Starch, MD 08/08/20 2252

## 2020-08-08 NOTE — ED Notes (Signed)
Notified provider of patient's concern with intermittent left neck pain/throbbing. Patient was concerned her HR was high. Rechecked vitals and notified patient of HR and BP WNL.

## 2020-08-10 ENCOUNTER — Encounter: Payer: Self-pay | Admitting: Surgery

## 2020-08-10 ENCOUNTER — Other Ambulatory Visit: Payer: Self-pay

## 2020-08-10 ENCOUNTER — Ambulatory Visit: Payer: BC Managed Care – PPO | Admitting: Surgery

## 2020-08-10 VITALS — BP 113/75 | HR 89 | Temp 98.3°F | Ht 63.0 in | Wt 161.0 lb

## 2020-08-10 DIAGNOSIS — G8929 Other chronic pain: Secondary | ICD-10-CM

## 2020-08-10 DIAGNOSIS — R1011 Right upper quadrant pain: Secondary | ICD-10-CM | POA: Diagnosis not present

## 2020-08-10 LAB — URINE CULTURE: Culture: NO GROWTH

## 2020-08-10 NOTE — Progress Notes (Addendum)
Patient ID: Angela Rocha, female   DOB: [birth year], 25 y.o.   MRN: [id number]  HPI Angela Rocha is a 25 y.o. female seen in consultation at the request of Dr. Jacqualine Code.  She does have a chronic history of right upper quadrant pain.  She states that he has been present for well over 6 months.  It is intermittent and does not necessarily seem to be associated with meals.  She does have associated nausea and some vomiting.  She has been seen in the past by Dr. Marius Ditch and a recent colonoscopy was performed that was completely normal.  Please note that have personally reviewed the colonoscopy images.  She also more recently went to emergency room with right upper quadrant pain CT scan as well as right upper quadrant ultrasound was done and I have personally reviewed them.  Essentially there was some evidence of pyelitis but no other acute intra-abdominal abnormalities. She was prescribed antibiotics. She Does have chronic diarrhea.  She did also have bloating and cramps. No prior abdominal operations CBC was completely normal as well as lipase and CMP. Is able to perform more than 4 METS of activity without any shortness of breath or chest pain.  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 Mother had a cholecystectomy with good results.  His father also cholecystectomy in the setting of metastatic liver disease  HPI  Past Medical History:  Diagnosis Date   ADHD    Anxiety    Asthma    History of dysplastic nevus 03/04/2020   right upper back, severe/excision   Migraine    Pelvic congestion syndrome    Recurrent upper respiratory infection (URI)    Urticaria     Past Surgical History:  Procedure Laterality Date   ADENOIDECTOMY     COLONOSCOPY WITH PROPOFOL N/A 03/23/2020   Procedure: COLONOSCOPY WITH PROPOFOL;  Surgeon: Lin Landsman, MD;  Location: [facility] ENDOSCOPY;  Service: Gastroenterology;  Laterality: N/A;   HIP SURGERY     TONSILLECTOMY     TYMPANOSTOMY TUBE PLACEMENT       Family History  Problem Relation Age of Onset   Diabetes Mellitus II Father    Cancer Father    Colon cancer Father    Cancer Paternal Grandfather    Lung cancer Paternal Grandfather     Social History Social History   Tobacco Use   Smoking status: Never   Smokeless tobacco: Never  Vaping Use   Vaping Use: Never used  Substance Use Topics   Alcohol use: Yes    Alcohol/week: 0.0 standard drinks    Comment: occas   Drug use: No    Allergies  Allergen Reactions   Doxycycline Nausea And Vomiting    nausea   Amoxicillin-Pot Clavulanate Rash and Other (See Comments)    Has patient had a PCN reaction causing immediate rash, facial/tongue/throat swelling, SOB or lightheadedness with hypotension: Yes Has patient had a PCN reaction causing severe rash involving mucus membranes or skin necrosis: No Has patient had a PCN reaction that required hospitalization: No Has patient had a PCN reaction occurring within the last 10 years: No If all of the above answers are "NO", then may proceed with Cephalosporin use.     Current Outpatient Medications  Medication Sig Dispense Refill   albuterol (VENTOLIN HFA) 108 (90 Base) MCG/ACT inhaler Inhale 2 puffs into the lungs every 6 (six) hours as needed for wheezing or shortness of breath. 1 each 4   amphetamine-dextroamphetamine (ADDERALL  XR) 15 MG 24 hr capsule Take by mouth.     budesonide-formoterol (SYMBICORT) 160-4.5 MCG/ACT inhaler Inhale 2 puffs into the lungs 2 (two) times daily. 1 each 5   busPIRone (BUSPAR) 7.5 MG tablet Take 7.5 mg by mouth 2 (two) times daily.     cetirizine (ZYRTEC) 10 MG tablet Take 10 mg by mouth daily.  3   ciprofloxacin (CIPRO) 500 MG tablet Take 1 tablet (500 mg total) by mouth 2 (two) times daily for 10 days. 20 tablet 0   cyanocobalamin (,VITAMIN B-12,) 1000 MCG/ML injection INJECT 1 ML (1,000 MCG TOTAL) INTO THE MUSCLE EVERY 30 (THIRTY) DAYS. 10 mL 1   dexmethylphenidate (FOCALIN) 2.5 MG tablet Take  2.5 mg by mouth daily.     dicyclomine (BENTYL) 10 MG capsule Take 1 capsule (10 mg total) by mouth 4 (four) times daily as needed for up to 14 days for spasms (abdominal pain). 56 capsule 0   EPINEPHrine (AUVI-Q) 0.3 mg/0.3 mL IJ SOAJ injection Inject 0.3 mg into the muscle as needed for anaphylaxis. 1 each 2   fluticasone (FLONASE) 50 MCG/ACT nasal spray Place 1 spray into both nostrils 2 (two) times daily as needed for allergies. 16 g 5   gabapentin (NEURONTIN) 100 MG capsule Take by mouth.     Galcanezumab-gnlm (EMGALITY) 120 MG/ML SOAJ Inject into the skin.     hydrocortisone 2.5 % cream Apply to affected areas left eye area twice a day as needed for up to 1 week. 30 g 0   hydrOXYzine (ATARAX/VISTARIL) 25 MG tablet Take 25 mg by mouth daily.     levonorgestrel (MIRENA) 20 MCG/24HR IUD by Intrauterine route.     montelukast (SINGULAIR) 10 MG tablet Take by mouth.     ondansetron (ZOFRAN ODT) 4 MG disintegrating tablet Take 1 tablet (4 mg total) by mouth every 8 (eight) hours as needed for nausea or vomiting. 20 tablet 0   rizatriptan (MAXALT-MLT) 10 MG disintegrating tablet Take 10 mg by mouth as directed. TAKE 1 TAB AT HEADACHE ONSET MAY TAKE A 2ND DOSE AFTER 2 HOURS IF NEEDED NO MORE THAN 2 DOSES/24 HRS  3   sucralfate (CARAFATE) 1 g tablet Take 1 tablet (1 g total) by mouth 4 (four) times daily. 120 tablet 0   terbinafine (LAMISIL) 250 MG tablet Take 1 by mouth as directed. 28 tablet 0   venlafaxine XR (EFFEXOR-XR) 75 MG 24 hr capsule Take by mouth.     dicyclomine (BENTYL) 10 MG capsule TAKE 1 CAPSULE (10 MG TOTAL) BY MOUTH 3 (THREE) TIMES DAILY WITH MEALS AS NEEDED FOR SPASMS. 270 capsule 0   No current facility-administered medications for this visit.     Review of Systems Full ROS  was asked and was negative except for the information on the HPI  Physical Exam Blood pressure 113/75, pulse 89, temperature 98.3 F (36.8 C), temperature source Oral, height '5\' 3"'$  (1.6 m), weight 161  lb (73 kg), SpO2 98 %. CONSTITUTIONAL: NAD EYES: Pupils are equal, round,, Sclera are non-icteric. EARS, NOSE, MOUTH AND THROAT: she is wearing a mask. Hearing is intact to voice. LYMPH NODES:  Lymph nodes in the neck are normal. RESPIRATORY:  Lungs are clear. There is normal respiratory effort, with equal breath sounds bilaterally, and without pathologic use of accessory muscles. CARDIOVASCULAR: Heart is regular without murmurs, gallops, or rubs. GI: The abdomen is soft, mild diffuse tenderness to palpation right upper quadrant epigastric area.  No peritonitis no Murphy, and nondistended. There  are no palpable masses. There is no hepatosplenomegaly. There are normal bowel sounds  GU: Rectal deferred.   MUSCULOSKELETAL: Normal muscle strength and tone. No cyanosis or edema.   SKIN: Turgor is good and there are no pathologic skin lesions or ulcers. NEUROLOGIC: Motor and sensation is grossly normal. Cranial nerves are grossly intact. PSYCH:  Oriented to person, place and time. Affect is normal.  Data Reviewed  I have personally reviewed the patient's imaging, laboratory findings and medical records.    Assessment/Plan  25 year old female with chronic right upper quadrant pain that currently is complex but has not we will be any specific diagnosis.  He can certainly be caused by biliary dyskinesia but is this time I do not have any objective data. I Will recommend obtaining a HIDA scan to assess the gallbladder function. I discussed with her in detail about my thought process. Patient agreement.  If in fact we found this to be biliary dyskinesia I do think that she will benefit from cholecystectomy.  We had also discussion regarding what it will tales.  Procedure explained to the patient in detail and to the mother.  Risks, benefits and possible implications including but not limited to: Bleeding, infection bowel injuries and chronic diarrhea.  They understand and they are in agreement with PLAN.   There is no need for emergent surgical intervention at this time. A copy of this report was sent to the referring provider  Caroleen Hamman, MD FACS General Surgeon 08/10/2020, 11:45 AM

## 2020-08-10 NOTE — Patient Instructions (Addendum)
HIDA scan scheduled 08/28/20 @ 11:30. Nothing to eat/drink 6 hours prior. Do not take any pain medications within 24 hours of having this scan. You may call [phone] to see if they have cancellations-if so call and reschedule your follow up appointment with Dr.Pabon after you have the scan done.   Biliary Colic, Adult  Biliary colic is severe pain caused by a problem with the gallbladder. The gallbladder is a small organ in the upper right part of the abdomen. The gallbladder stores a digestive fluid produced in the liver (bile) that helps the body break down fat. Bile and other digestive enzymes are carried from the liver to the small intestine through tube-like structurescalled bile ducts. The gallbladder and the bile ducts form the biliary tract. Sometimes, hard deposits of digestive fluids (gallstones) form in the gallbladder and block the flow of bile from the gallbladder, causing biliary colic. This condition is also called a gallbladder attack. Gallstones can be as small as a grain of sand or as big as a golf ball. Therecould be just one gallstone in the gallbladder, or there could be many. What are the causes? This condition is usually caused by gallstones. Less often, a tumor could blockthe flow of bile from the gallbladder and trigger biliary colic. What increases the risk? The following factors may make you more likely to develop this condition: Being female. Having a family history of gallstones. Being obese. Losing weight suddenly or quickly. Eating a diet that is high in calories, low in fiber, and rich in refined carbohydrates, such as white bread and white rice. Having certain health conditions, such as: An intestinal disease that affects nutrient absorption, such as Crohn's disease. A metabolic condition, such as diabetes or metabolic syndrome. Metabolic syndrome occurs when someone has high blood pressure, high cholesterol, and diabetes. A blood condition, such as hemolytic  anemia or sickle cell disease. What are the signs or symptoms? The main symptom of this condition is severe pain in the upper right side of the abdomen. You may feel this pain below the chest but above the hip. This pain often occurs at night or after eating a meal that is high in fat. This pain may get worse for up to an hour and last as long as 12 hours. In most cases, the pain fades (subsides) within 2 hours. Other symptoms of this condition include: Nausea and vomiting. Pain under the right shoulder. How is this diagnosed? This condition is diagnosed based on your medical history, your symptoms, and aphysical exam. You may also have tests, including: Blood tests to rule out infection or inflammation of the bile ducts, gallbladder, pancreas, or liver. Imaging studies, such as: An ultrasound. A CT scan. An MRI. In some cases, you may need to have an imaging study done using a small amount of radioactive material (nuclear medicine) to confirm the diagnosis. How is this treated? This condition may be treated with medicines to: Relieve your pain or nausea. Dissolve the gallstones. It may take months or years before the gallstones are completely gone. If you have gallstones, or if you have a tumor in the gallbladder that is causing biliary colic, you may need surgery to remove the gallbladder (cholecystectomy). Follow these instructions at home: Eating and drinking Drink enough fluid to keep your urine pale yellow. Follow instructions from your health care provider about eating or drinking restrictions. These may include avoiding: Fatty, greasy, and fried foods. Any foods that make the pain worse. Overeating. Having a large meal  after not eating for a while. General instructions Take over-the-counter and prescription medicines only as told by your health care provider. Keep all follow-up visits as told by your health care provider. This is important. How is this prevented? Steps to  prevent this condition include: Maintaining a healthy body weight. Getting regular exercise. Eating a healthy diet that is high in fiber and low in fat. Limiting how much sugar and refined carbohydrates you eat. Contact a health care provider if: Your pain lasts more than 5 hours. You vomit. You have a fever and chills. Your pain gets worse. Get help right away if: Your skin or the whites of your eyes look yellow (jaundice). Your have tea-colored urine and light-colored stools (feces). You are dizzy or you faint. Summary Biliary colic is severe pain caused by a problem with the gallbladder. The gallbladder is a small organ in the upper right part of your abdomen. Treatment for this condition may include medicine to relieve your pain or nausea, or medicine to slowly dissolve the gallstones. If you have gallstones, or if you have a tumor in the gallbladder that is causing biliary colic, you may need surgery to remove the gallbladder (cholecystectomy). This information is not intended to replace advice given to you by your health care provider. Make sure you discuss any questions you have with your healthcare provider. Document Revised: 01/08/2019 Document Reviewed: 10/30/2018 Elsevier Patient Education  [employer].

## 2020-08-25 ENCOUNTER — Other Ambulatory Visit: Payer: Self-pay

## 2020-08-25 ENCOUNTER — Ambulatory Visit
Admission: RE | Admit: 2020-08-25 | Discharge: 2020-08-25 | Disposition: A | Discharge status: Home / Self Care | Payer: BC Managed Care – PPO | Source: Ambulatory Visit | Attending: Surgery | Admitting: Surgery

## 2020-08-25 DIAGNOSIS — G8929 Other chronic pain: Secondary | ICD-10-CM

## 2020-08-25 DIAGNOSIS — R1011 Right upper quadrant pain: Secondary | ICD-10-CM | POA: Insufficient documentation

## 2020-08-25 MED ORDER — TECHNETIUM TC 99M MEBROFENIN IV KIT
5.0000 | PACK | Freq: Once | INTRAVENOUS | Status: AC | PRN
  Administered 2020-08-25: 5.07 via INTRAVENOUS

## 2020-08-26 ENCOUNTER — Telehealth: Payer: Self-pay

## 2020-08-26 NOTE — Telephone Encounter (Signed)
Mail box full unable to leave message-HIDA scan normal but keep follow up appointment with Dr.Pabon. 08/31/20 @ 9:30 Virtual visit.

## 2020-08-31 ENCOUNTER — Telehealth: Payer: Self-pay | Admitting: Surgery

## 2020-08-31 ENCOUNTER — Telehealth (INDEPENDENT_AMBULATORY_CARE_PROVIDER_SITE_OTHER): Payer: BC Managed Care – PPO | Admitting: Surgery

## 2020-08-31 ENCOUNTER — Other Ambulatory Visit: Payer: Self-pay

## 2020-08-31 DIAGNOSIS — R1011 Right upper quadrant pain: Secondary | ICD-10-CM

## 2020-08-31 DIAGNOSIS — G8929 Other chronic pain: Secondary | ICD-10-CM

## 2020-08-31 NOTE — Telephone Encounter (Signed)
Patient calls back.  She is informed of all dates regarding her surgery and information given.  Patient verbalized understanding.

## 2020-08-31 NOTE — Progress Notes (Signed)
Referring provider:  Wayland Denis, PA-C [address] [place],  [place] [address]  Virtual Visit via Telemedicine Note  I connected with Angela Rocha by Video virtual visit at hher home on 08/31/20 at  9:30 AM EDT and verified that I was speaking with the correct person using their name and two idenfiers/date of birth.   I discussed the limitations, risks, security and privacy concerns of performing an evaluation and management service by video telemedicine and the availability of in person appointments. I also discussed with the patient that there may be a patient responsible charge related to this service. The patient expressed understanding and agreed to proceed.  History of Present Illness: 25 y.o. Female well-known to me with history chronic abdominal pain epigastric, right upper quadrant.  The pain is intermittent and crampy and seems to be worsening by heavy meals.  She did have an ultrasound that have personally reviewed showing no evidence of stones or acute cholecystitis.  Last visit I ordered a HIDA scan with ejection fraction that have personally reviewed and shows a preserved ejection fraction no evidence of cholecystitis.  The patient did however have replication of symptoms after the Ensure was given about 20 minutes and later that day.  She continues to have intermittent colicky pain that is mild to moderate intensity associated with nausea and vomiting.  Last time she vomited was about a week ago.  No fevers no chills.  She does have a history of irritable bowel syndrome and has been seeing Dr. Marius Ditch.  She takes Bentyl with no significant change.  Her grandmother had a history of similar issues and improved after cholecystectomy. She is currently in college and she is able to perform more than 4 METS of activity without any shortness of breath or chest pain.  No prior abdominal operations She denies any fevers chills or biliary obstruction.  No  jaundice    Review of Systems:  Constitutional: denies fever/chills  ENT: denies sore throat, hearing problems  Respiratory: denies shortness of breath, wheezing  Cardiovascular: denies chest pain, palpitations  Gastrointestinal: ABD PAIN, NAUSEA, VOMITING AND DIARRHEA Skin: Denies any other rashes or skin discolorations except  Laboratory studies:   Assessment:  25 y.o. yo Female with a problem list including...  Patient Active Problem List   Diagnosis Date Noted   Pain from implanted hardware 03/23/2020   History of colon cancer    Asthma, not well controlled 02/12/2020   History of frequent upper respiratory infection 02/12/2020   Adverse reaction to food, subsequent encounter 02/12/2020   Drug reaction 02/12/2020   Heartburn 02/12/2020   History of systemic reaction to hymenoptera sting 02/12/2020   History of urticaria 02/12/2020   Numbness and tingling in left arm 01/16/2020   Other allergic rhinitis 01/08/2020   Migraine without aura and without status migrainosus, not intractable 08/23/2017   Generalized anxiety disorder 05/22/2017   Other acquired deformity of hip 05/22/2017   Pyelonephritis 05/17/2017   Attention deficit hyperactivity disorder (ADHD), 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   Vitamin D deficiency 01/12/2016   B12 deficiency 01/12/2016   Status post arthroscopy of hip 12/25/2015   Left hip pain 03/04/2015   Labral tear of hip, degenerative 02/04/2015   Quadriceps tendinitis 01/06/2015   Femoral neck stress fracture 01/06/2015   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  We had an extensive discussion about her condition.  Potential role for cholecystectomy.  I have also discussed with her the results of the HIDA scan and she understands.  She is very  smart.  After lengthy conversation she was asking about the potential for cholecystectomy to improve her symptoms.  I certainly disclose that there is false positives and false negative test and a HIDA scan.  There is certainly possibility that she may improve with cholecystectomy but this is definitely not a guarantee.  She also understands that cholecystectomy may worsens her diarrhea.  After discussion of WHAT IT WOULD entail to have a cholecystectomy she wishes to move forward with procedure. She has also discussed w Dr. Marius Ditch and feels that this is the right step for her. I do think that Robotic chole would be a good approach for her    The risks, benefits, complications, treatment options, and expected outcomes were discussed with the patient. The possibilities of bleeding, recurrent infection, finding a normal gallbladder, perforation of viscus organs, damage to surrounding structures, bile leak, abscess formation, needing a drain placed, the need for additional procedures, reaction to medication, pulmonary aspiration,  failure to diagnose a condition, the possible need to convert to an open procedure, and creating a complication requiring transfusion or operation were discussed with the patient. The patient and/or family concurred with the proposed plan, giving informed consent.     All of the above recommendations were discussed with the patient, and all of patient's questions were answered to her expressed satisfaction.  The patient was advised to call back or seek an in-person evaluation if the symptoms worsen or if the condition fails to improve as anticipated.  From [facility] outpatient surgery office, I provided 30 minutes of non-face-to-face time during this encounter.  -- Caroleen Hamman, MD,FACS [place] for exceptional care. Office: [phone]

## 2020-08-31 NOTE — Telephone Encounter (Signed)
Outgoing call is made, left message for patient to call.  Please inform patient of the following:  Patient has been advised of Pre-Admission date/time, COVID Testing date and Surgery date.  Surgery Date: 09/10/20 Preadmission Testing Date: 09/03/20 (phone 8a-1p) Covid Testing Date: Not needed.     Also patient will need to call at [phone], between 1-3:00pm the day before surgery, to find out what time to arrive for surgery.

## 2020-09-03 ENCOUNTER — Other Ambulatory Visit
Admission: RE | Admit: 2020-09-03 | Discharge: 2020-09-03 | Disposition: A | Discharge status: Home / Self Care | Payer: BC Managed Care – PPO | Source: Ambulatory Visit | Attending: Surgery | Admitting: Surgery

## 2020-09-03 ENCOUNTER — Other Ambulatory Visit: Payer: Self-pay

## 2020-09-03 HISTORY — DX: Deficiency of other specified B group vitamins: E53.8

## 2020-09-03 HISTORY — DX: Gastro-esophageal reflux disease without esophagitis: K21.9

## 2020-09-03 HISTORY — DX: Tubulo-interstitial nephritis, not specified as acute or chronic: N12

## 2020-09-03 HISTORY — DX: Stress fracture, unspecified femur, initial encounter for fracture: M84.353A

## 2020-09-03 HISTORY — DX: Other specified congenital deformities of hip: Q65.89

## 2020-09-03 NOTE — Patient Instructions (Addendum)
Your procedure is scheduled on: September 10, 2020 THURSDAY Report to the Registration Desk on the 1st floor of the [facility]. To find out your arrival time, please call [phone] between 1PM - 3PM on: Wednesday September 09, 2020  REMEMBER: Instructions that are not followed completely may result in serious medical risk, up to and including death; or upon the discretion of your surgeon and anesthesiologist your surgery may need to be rescheduled.  Do not eat food after midnight the night before surgery.  No gum chewing, lozengers or hard candies.  You may however, drink CLEAR liquids up to 2 hours before you are scheduled to arrive for your surgery. Do not drink anything within 2 hours of your scheduled arrival time.  Clear liquids include: - water  - apple juice without pulp - gatorade (not RED, PURPLE, OR BLUE) - black coffee or tea (Do NOT add milk or creamers to the coffee or tea) Do NOT drink anything that is not on this list.  TAKE THESE MEDICATIONS THE MORNING OF SURGERY WITH A SIP OF WATER: LAMICTAL  GABAPENTIN  Use inhalers on the day of surgery   One week prior to surgery: Stop Anti-inflammatories (NSAIDS) such as Advil, Aleve, Ibuprofen, Motrin, Naproxen, Naprosyn and ASPIRIN OR Aspirin based products such as Excedrin, Goodys Powder, BC Powder. Stop ANY OVER THE COUNTER supplements until after surgery. You may however, continue to take Tylenol if needed for pain up until the day of surgery.  No Alcohol for 24 hours before or after surgery.  No Smoking including e-cigarettes for 24 hours prior to surgery.  No chewable tobacco products for at least 6 hours prior to surgery.  No nicotine patches on the day of surgery.  Do not use any "recreational" drugs for at least a week prior to your surgery.  Please be advised that the combination of cocaine and anesthesia may have negative outcomes, up to and including death. If you test positive for cocaine, your surgery will  be cancelled.  On the morning of surgery brush your teeth with toothpaste and water, you may rinse your mouth with mouthwash if you wish. Do not swallow any toothpaste or mouthwash.  Do not wear jewelry, make-up, hairpins, clips or nail polish.  Do not wear lotions, powders, or perfumes OR DEODORANT   Do not shave body from the neck down 48 hours prior to surgery just in case you cut yourself which could leave a site for infection.  Also, freshly shaved skin may become irritated if using the CHG soap.  Contact lenses, hearing aids and dentures may not be worn into surgery.  Do not bring valuables to the hospital. [facility] is not responsible for any missing/lost belongings or valuables.   SHOWER WITH DIAL SOAP  Notify your doctor if there is any change in your medical condition (cold, fever, infection).  Wear comfortable clothing (specific to your surgery type) to the hospital.  After surgery, you can help prevent lung complications by doing breathing exercises.  Take deep breaths and cough every 1-2 hours. Your doctor may order a device called an Incentive Spirometer to help you take deep breaths. When coughing or sneezing, hold a pillow firmly against your incision with both hands. This is called "splinting." Doing this helps protect your incision. It also decreases belly discomfort.  If you are being discharged the day of surgery, you will not be allowed to drive home. You will need a responsible adult (18 years or older) to drive you  home and stay with you that night.   If you are taking public transportation, you will need to have a responsible adult (18 years or older) with you. Please confirm with your physician that it is acceptable to use public transportation.   Please call the [place] Dept. at [phone] if you have any questions about these instructions.  Surgery Visitation Policy:  Patients undergoing a surgery or procedure may have one family  member or support person with them as long as that person is not COVID-19 positive or experiencing its symptoms.  That person may remain in the waiting area during the procedure.

## 2020-09-09 MED ORDER — CHLORHEXIDINE GLUCONATE CLOTH 2 % EX PADS: 6.0000 | MEDICATED_PAD | Freq: Once | CUTANEOUS | Status: DC

## 2020-09-09 MED ORDER — CEFAZOLIN SODIUM-DEXTROSE 2-4 GM/100ML-% IV SOLN
2.0000 g | INTRAVENOUS | Status: AC
  Administered 2020-09-10: 2 g via INTRAVENOUS

## 2020-09-09 MED ORDER — LACTATED RINGERS IV SOLN: INTRAVENOUS | Status: DC

## 2020-09-09 MED ORDER — FAMOTIDINE 20 MG PO TABS: 20.0000 mg | ORAL_TABLET | Freq: Once | ORAL | Status: AC

## 2020-09-09 MED ORDER — ORAL CARE MOUTH RINSE: 15.0000 mL | Freq: Once | OROMUCOSAL | Status: AC

## 2020-09-09 MED ORDER — CHLORHEXIDINE GLUCONATE 0.12 % MT SOLN: 15.0000 mL | Freq: Once | OROMUCOSAL | Status: AC

## 2020-09-10 ENCOUNTER — Ambulatory Visit: Payer: BC Managed Care – PPO | Admitting: Certified Registered"

## 2020-09-10 ENCOUNTER — Encounter: Payer: Self-pay | Admitting: Surgery

## 2020-09-10 ENCOUNTER — Encounter
Admission: RE | Disposition: A | Discharge status: Home / Self Care | Payer: Self-pay | Source: Home / Self Care | Attending: Surgery

## 2020-09-10 ENCOUNTER — Other Ambulatory Visit: Payer: Self-pay

## 2020-09-10 ENCOUNTER — Ambulatory Visit
Admission: RE | Admit: 2020-09-10 | Discharge: 2020-09-10 | Disposition: A | Discharge status: Home / Self Care | Payer: BC Managed Care – PPO | Attending: Surgery | Admitting: Surgery

## 2020-09-10 DIAGNOSIS — R1011 Right upper quadrant pain: Secondary | ICD-10-CM

## 2020-09-10 DIAGNOSIS — Z881 Allergy status to other antibiotic agents status: Secondary | ICD-10-CM | POA: Insufficient documentation

## 2020-09-10 DIAGNOSIS — Z79899 Other long term (current) drug therapy: Secondary | ICD-10-CM | POA: Diagnosis not present

## 2020-09-10 DIAGNOSIS — Z880 Allergy status to penicillin: Secondary | ICD-10-CM | POA: Insufficient documentation

## 2020-09-10 DIAGNOSIS — K8044 Calculus of bile duct with chronic cholecystitis without obstruction: Secondary | ICD-10-CM | POA: Insufficient documentation

## 2020-09-10 DIAGNOSIS — G8929 Other chronic pain: Secondary | ICD-10-CM

## 2020-09-10 LAB — POCT PREGNANCY, URINE: Preg Test, Ur: NEGATIVE

## 2020-09-10 SURGERY — CHOLECYSTECTOMY, ROBOT-ASSISTED, LAPAROSCOPIC
Anesthesia: General

## 2020-09-10 MED ORDER — FENTANYL CITRATE (PF) 100 MCG/2ML IJ SOLN
INTRAMUSCULAR | Status: AC
  Filled 2020-09-10: qty 2

## 2020-09-10 MED ORDER — CHLORHEXIDINE GLUCONATE 0.12 % MT SOLN
OROMUCOSAL | Status: AC
  Administered 2020-09-10: 15 mL via OROMUCOSAL
  Filled 2020-09-10: qty 15

## 2020-09-10 MED ORDER — INDOCYANINE GREEN 25 MG IV SOLR
2.5000 mg | Freq: Once | INTRAVENOUS | Status: AC
  Administered 2020-09-10: 2.5 mg via INTRAVENOUS
  Filled 2020-09-10: qty 1

## 2020-09-10 MED ORDER — OXYCODONE HCL 5 MG PO TABS
5.0000 mg | ORAL_TABLET | Freq: Once | ORAL | Status: AC | PRN
  Administered 2020-09-10: 5 mg via ORAL

## 2020-09-10 MED ORDER — DIPHENHYDRAMINE HCL 50 MG/ML IJ SOLN: 12.5000 mg | Freq: Once | INTRAMUSCULAR | Status: AC

## 2020-09-10 MED ORDER — FENTANYL CITRATE (PF) 100 MCG/2ML IJ SOLN
INTRAMUSCULAR | Status: DC | PRN
  Administered 2020-09-10 (×3): 50 ug via INTRAVENOUS

## 2020-09-10 MED ORDER — SODIUM CHLORIDE FLUSH 0.9 % IV SOLN
INTRAVENOUS | Status: AC
  Filled 2020-09-10: qty 10

## 2020-09-10 MED ORDER — ONDANSETRON HCL 4 MG/2ML IJ SOLN
INTRAMUSCULAR | Status: AC
  Filled 2020-09-10: qty 2

## 2020-09-10 MED ORDER — ONDANSETRON HCL 4 MG/2ML IJ SOLN: 4.0000 mg | Freq: Once | INTRAMUSCULAR | Status: DC | PRN

## 2020-09-10 MED ORDER — HYDROCODONE-ACETAMINOPHEN 5-325 MG PO TABS: 1.0000 | ORAL_TABLET | ORAL | 0 refills | Status: DC | PRN

## 2020-09-10 MED ORDER — ROCURONIUM BROMIDE 100 MG/10ML IV SOLN
INTRAVENOUS | Status: DC | PRN
  Administered 2020-09-10: 50 mg via INTRAVENOUS

## 2020-09-10 MED ORDER — DEXAMETHASONE SODIUM PHOSPHATE 10 MG/ML IJ SOLN
INTRAMUSCULAR | Status: AC
  Filled 2020-09-10: qty 1

## 2020-09-10 MED ORDER — OXYCODONE HCL 5 MG PO TABS
ORAL_TABLET | ORAL | Status: AC
  Filled 2020-09-10: qty 1

## 2020-09-10 MED ORDER — ONDANSETRON HCL 4 MG/2ML IJ SOLN
INTRAMUSCULAR | Status: DC | PRN
  Administered 2020-09-10 (×2): 4 mg via INTRAVENOUS

## 2020-09-10 MED ORDER — DIPHENHYDRAMINE HCL 50 MG/ML IJ SOLN
INTRAMUSCULAR | Status: AC
  Filled 2020-09-10: qty 1

## 2020-09-10 MED ORDER — GLYCOPYRROLATE 0.2 MG/ML IJ SOLN
INTRAMUSCULAR | Status: DC | PRN
  Administered 2020-09-10: .1 mg via INTRAVENOUS

## 2020-09-10 MED ORDER — DIPHENHYDRAMINE HCL 50 MG/ML IJ SOLN
INTRAMUSCULAR | Status: AC
  Administered 2020-09-10: 12.5 mg via INTRAVENOUS
  Filled 2020-09-10: qty 1

## 2020-09-10 MED ORDER — PROMETHAZINE HCL 25 MG/ML IJ SOLN
INTRAMUSCULAR | Status: AC
  Filled 2020-09-10: qty 1

## 2020-09-10 MED ORDER — FENTANYL CITRATE (PF) 100 MCG/2ML IJ SOLN
25.0000 ug | INTRAMUSCULAR | Status: DC | PRN
  Administered 2020-09-10: 50 ug via INTRAVENOUS
  Administered 2020-09-10 (×4): 25 ug via INTRAVENOUS

## 2020-09-10 MED ORDER — BUPIVACAINE-EPINEPHRINE (PF) 0.25% -1:200000 IJ SOLN
INTRAMUSCULAR | Status: DC | PRN
  Administered 2020-09-10: 50 mL via INTRAMUSCULAR

## 2020-09-10 MED ORDER — HYDROMORPHONE HCL 1 MG/ML IJ SOLN
INTRAMUSCULAR | Status: AC
  Filled 2020-09-10: qty 1

## 2020-09-10 MED ORDER — OXYCODONE HCL 5 MG/5ML PO SOLN: 5.0000 mg | Freq: Once | ORAL | Status: AC | PRN

## 2020-09-10 MED ORDER — PROMETHAZINE HCL 25 MG/ML IJ SOLN
6.2500 mg | Freq: Once | INTRAMUSCULAR | Status: AC
  Administered 2020-09-10: 6.25 mg via INTRAVENOUS

## 2020-09-10 MED ORDER — DIPHENHYDRAMINE HCL 50 MG/ML IJ SOLN
12.5000 mg | Freq: Once | INTRAMUSCULAR | Status: AC
  Administered 2020-09-10: 12.5 mg via INTRAVENOUS

## 2020-09-10 MED ORDER — PROPOFOL 10 MG/ML IV BOLUS
INTRAVENOUS | Status: DC | PRN
  Administered 2020-09-10: 150 mg via INTRAVENOUS

## 2020-09-10 MED ORDER — MIDAZOLAM HCL 2 MG/2ML IJ SOLN
INTRAMUSCULAR | Status: DC | PRN
  Administered 2020-09-10: 2 mg via INTRAVENOUS

## 2020-09-10 MED ORDER — BUPIVACAINE-EPINEPHRINE (PF) 0.25% -1:200000 IJ SOLN
INTRAMUSCULAR | Status: AC
  Filled 2020-09-10: qty 30

## 2020-09-10 MED ORDER — SUGAMMADEX SODIUM 200 MG/2ML IV SOLN
INTRAVENOUS | Status: DC | PRN
  Administered 2020-09-10: 300 mg via INTRAVENOUS

## 2020-09-10 MED ORDER — 0.9 % SODIUM CHLORIDE (POUR BTL) OPTIME
TOPICAL | Status: DC | PRN
  Administered 2020-09-10: 500 mL

## 2020-09-10 MED ORDER — ACETAMINOPHEN 10 MG/ML IV SOLN: 1000.0000 mg | Freq: Once | INTRAVENOUS | Status: DC | PRN

## 2020-09-10 MED ORDER — PHENYLEPHRINE HCL (PRESSORS) 10 MG/ML IV SOLN
INTRAVENOUS | Status: DC | PRN
  Administered 2020-09-10: 100 ug via INTRAVENOUS

## 2020-09-10 MED ORDER — HYDROMORPHONE HCL 1 MG/ML IJ SOLN
0.5000 mg | INTRAMUSCULAR | Status: DC | PRN
  Administered 2020-09-10 (×2): 0.5 mg via INTRAVENOUS

## 2020-09-10 MED ORDER — MIDAZOLAM HCL 2 MG/2ML IJ SOLN
INTRAMUSCULAR | Status: AC
  Filled 2020-09-10: qty 2

## 2020-09-10 MED ORDER — ACETAMINOPHEN 10 MG/ML IV SOLN
INTRAVENOUS | Status: DC | PRN
  Administered 2020-09-10: 1000 mg via INTRAVENOUS

## 2020-09-10 MED ORDER — DEXAMETHASONE SODIUM PHOSPHATE 10 MG/ML IJ SOLN
INTRAMUSCULAR | Status: DC | PRN
  Administered 2020-09-10: 10 mg via INTRAVENOUS

## 2020-09-10 MED ORDER — CEFAZOLIN SODIUM-DEXTROSE 2-4 GM/100ML-% IV SOLN
INTRAVENOUS | Status: AC
  Filled 2020-09-10: qty 100

## 2020-09-10 MED ORDER — BUPIVACAINE LIPOSOME 1.3 % IJ SUSP
INTRAMUSCULAR | Status: AC
  Filled 2020-09-10: qty 20

## 2020-09-10 MED ORDER — FAMOTIDINE 20 MG PO TABS
ORAL_TABLET | ORAL | Status: AC
  Administered 2020-09-10: 20 mg via ORAL
  Filled 2020-09-10: qty 1

## 2020-09-10 MED ORDER — LIDOCAINE HCL (CARDIAC) PF 100 MG/5ML IV SOSY
PREFILLED_SYRINGE | INTRAVENOUS | Status: DC | PRN
Start: 2020-09-10 — End: 2020-09-10
  Administered 2020-09-10: 80 mg via INTRAVENOUS

## 2020-09-10 SURGICAL SUPPLY — 54 items
"PENCIL ELECTRO HAND CTR " (MISCELLANEOUS) ×1 IMPLANT
ADH SKN CLS APL DERMABOND .7 (GAUZE/BANDAGES/DRESSINGS) ×1
APL PRP STRL LF DISP 70% ISPRP (MISCELLANEOUS) ×1
BAG SPEC RTRVL LRG 6X4 10 (ENDOMECHANICALS) ×1
CANNULA REDUC XI 12-8 STAPL (CANNULA) ×2
CANNULA REDUCER 12-8 DVNC XI (CANNULA) ×1 IMPLANT
CHLORAPREP W/TINT 26 (MISCELLANEOUS) ×2 IMPLANT
CLIP VESOLOCK MED LG 6/CT (CLIP) ×2 IMPLANT
DECANTER SPIKE VIAL GLASS SM (MISCELLANEOUS) ×2 IMPLANT
DEFOGGER SCOPE WARMER CLEARIFY (MISCELLANEOUS) ×2 IMPLANT
DERMABOND ADVANCED (GAUZE/BANDAGES/DRESSINGS) ×1
DERMABOND ADVANCED .7 DNX12 (GAUZE/BANDAGES/DRESSINGS) ×1 IMPLANT
DRAPE ARM DVNC X/XI (DISPOSABLE) ×4 IMPLANT
DRAPE COLUMN DVNC XI (DISPOSABLE) ×1 IMPLANT
DRAPE DA VINCI XI ARM (DISPOSABLE) ×8
DRAPE DA VINCI XI COLUMN (DISPOSABLE) ×2
ELECT CAUTERY BLADE 6.4 (BLADE) ×2 IMPLANT
ELECT REM PT RETURN 9FT ADLT (ELECTROSURGICAL) ×2
ELECTRODE REM PT RTRN 9FT ADLT (ELECTROSURGICAL) ×1 IMPLANT
GAUZE 4X4 16PLY ~~LOC~~+RFID DBL (SPONGE) ×2 IMPLANT
GLOVE SURG ENC MOIS LTX SZ7 (GLOVE) ×4 IMPLANT
GOWN STRL REUS W/ TWL LRG LVL3 (GOWN DISPOSABLE) ×4 IMPLANT
GOWN STRL REUS W/TWL LRG LVL3 (GOWN DISPOSABLE) ×8
IRRIGATION STRYKERFLOW (MISCELLANEOUS) IMPLANT
IRRIGATOR STRYKERFLOW (MISCELLANEOUS)
IV NS 1000ML (IV SOLUTION)
IV NS 1000ML BAXH (IV SOLUTION) IMPLANT
KIT PINK PAD W/HEAD ARE REST (MISCELLANEOUS) ×2
KIT PINK PAD W/HEAD ARM REST (MISCELLANEOUS) ×1 IMPLANT
LABEL OR SOLS (LABEL) ×2 IMPLANT
MANIFOLD NEPTUNE II (INSTRUMENTS) ×1 IMPLANT
NEEDLE HYPO 22GX1.5 SAFETY (NEEDLE) ×2 IMPLANT
NS IRRIG 500ML POUR BTL (IV SOLUTION) ×2 IMPLANT
OBTURATOR OPTICAL STANDARD 8MM (TROCAR) ×2
OBTURATOR OPTICAL STND 8 DVNC (TROCAR) ×1
OBTURATOR OPTICALSTD 8 DVNC (TROCAR) ×1 IMPLANT
PACK LAP CHOLECYSTECTOMY (MISCELLANEOUS) ×2 IMPLANT
PENCIL ELECTRO HAND CTR (MISCELLANEOUS) ×2 IMPLANT
POUCH SPECIMEN RETRIEVAL 10MM (ENDOMECHANICALS) ×2 IMPLANT
SEAL CANN UNIV 5-8 DVNC XI (MISCELLANEOUS) ×3 IMPLANT
SEAL XI 5MM-8MM UNIVERSAL (MISCELLANEOUS) ×6
SET TUBE SMOKE EVAC HIGH FLOW (TUBING) ×2 IMPLANT
SOLUTION ELECTROLUBE (MISCELLANEOUS) ×2 IMPLANT
SPONGE T-LAP 18X18 ~~LOC~~+RFID (SPONGE) ×2 IMPLANT
SPONGE T-LAP 4X18 ~~LOC~~+RFID (SPONGE) IMPLANT
STAPLER CANNULA SEAL DVNC XI (STAPLE) ×1 IMPLANT
STAPLER CANNULA SEAL XI (STAPLE) ×2
SUT MNCRL AB 4-0 PS2 18 (SUTURE) ×3 IMPLANT
SUT VICRYL 0 AB UR-6 (SUTURE) ×4 IMPLANT
SYR 20ML LL LF (SYRINGE) ×2 IMPLANT
SYR 30ML LL (SYRINGE) ×2 IMPLANT
TAPE TRANSPORE STRL 2 31045 (GAUZE/BANDAGES/DRESSINGS) ×2 IMPLANT
TROCAR BALLN GELPORT 12X130M (ENDOMECHANICALS) ×2 IMPLANT
WATER STERILE IRR 500ML POUR (IV SOLUTION) ×1 IMPLANT

## 2020-09-10 NOTE — H&P (Signed)
HPI  Angela Rocha is a 25 y.o. female seen in consultation at the request of Dr. Jacqualine Code.  She does have a chronic history of right upper quadrant pain.  She states that he has been present for well over 6 months.  It is intermittent and does not necessarily seem to be associated with meals.  She does have associated nausea and some vomiting.  She is here for cholecystectomy       HPI       Past Medical History:  Diagnosis Date   ADHD     Anxiety     Asthma     History of dysplastic nevus 03/04/2020    right upper back, severe/excision   Migraine     Pelvic congestion syndrome     Recurrent upper respiratory infection (URI)     Urticaria             Past Surgical History:  Procedure Laterality Date   ADENOIDECTOMY       COLONOSCOPY WITH PROPOFOL N/A 03/23/2020    Procedure: COLONOSCOPY WITH PROPOFOL;  Surgeon: Lin Landsman, MD;  Location: [facility];  Service: Gastroenterology;  Laterality: N/A;   HIP SURGERY       TONSILLECTOMY       TYMPANOSTOMY TUBE PLACEMENT               Family History  Problem Relation Age of Onset   Diabetes Mellitus II Father     Cancer Father     Colon cancer Father     Cancer Paternal Grandfather     Lung cancer Paternal Grandfather        Social History Social History         Tobacco Use   Smoking status: Never   Smokeless tobacco: Never  Vaping Use   Vaping Use: Never used  Substance Use Topics   Alcohol use: Yes      Alcohol/week: 0.0 standard drinks      Comment: occas   Drug use: No           Allergies  Allergen Reactions   Doxycycline Nausea And Vomiting      nausea   Amoxicillin-Pot Clavulanate Rash and Other (See Comments)      Has patient had a PCN reaction causing immediate rash, facial/tongue/throat swelling, SOB or lightheadedness with hypotension: Yes Has patient had a PCN reaction causing severe rash involving mucus membranes or skin necrosis: No Has patient had a PCN reaction that required  hospitalization: No Has patient had a PCN reaction occurring within the last 10 years: No If all of the above answers are "NO", then may proceed with Cephalosporin use.              Current Outpatient Medications  Medication Sig Dispense Refill   albuterol (VENTOLIN HFA) 108 (90 Base) MCG/ACT inhaler Inhale 2 puffs into the lungs every 6 (six) hours as needed for wheezing or shortness of breath. 1 each 4   amphetamine-dextroamphetamine (ADDERALL XR) 15 MG 24 hr capsule Take by mouth.       budesonide-formoterol (SYMBICORT) 160-4.5 MCG/ACT inhaler Inhale 2 puffs into the lungs 2 (two) times daily. 1 each 5   busPIRone (BUSPAR) 7.5 MG tablet Take 7.5 mg by mouth 2 (two) times daily.       cetirizine (ZYRTEC) 10 MG tablet Take 10 mg by mouth daily.   3   ciprofloxacin (CIPRO) 500 MG tablet Take 1 tablet (500 mg total) by mouth 2 (two)  times daily for 10 days. 20 tablet 0   cyanocobalamin (,VITAMIN B-12,) 1000 MCG/ML injection INJECT 1 ML (1,000 MCG TOTAL) INTO THE MUSCLE EVERY 30 (THIRTY) DAYS. 10 mL 1   dexmethylphenidate (FOCALIN) 2.5 MG tablet Take 2.5 mg by mouth daily.       dicyclomine (BENTYL) 10 MG capsule Take 1 capsule (10 mg total) by mouth 4 (four) times daily as needed for up to 14 days for spasms (abdominal pain). 56 capsule 0   EPINEPHrine (AUVI-Q) 0.3 mg/0.3 mL IJ SOAJ injection Inject 0.3 mg into the muscle as needed for anaphylaxis. 1 each 2   fluticasone (FLONASE) 50 MCG/ACT nasal spray Place 1 spray into both nostrils 2 (two) times daily as needed for allergies. 16 g 5   gabapentin (NEURONTIN) 100 MG capsule Take by mouth.       Galcanezumab-gnlm (EMGALITY) 120 MG/ML SOAJ Inject into the skin.       hydrocortisone 2.5 % cream Apply to affected areas left eye area twice a day as needed for up to 1 week. 30 g 0   hydrOXYzine (ATARAX/VISTARIL) 25 MG tablet Take 25 mg by mouth daily.       levonorgestrel (MIRENA) 20 MCG/24HR IUD by Intrauterine route.       montelukast  (SINGULAIR) 10 MG tablet Take by mouth.       ondansetron (ZOFRAN ODT) 4 MG disintegrating tablet Take 1 tablet (4 mg total) by mouth every 8 (eight) hours as needed for nausea or vomiting. 20 tablet 0   rizatriptan (MAXALT-MLT) 10 MG disintegrating tablet Take 10 mg by mouth as directed. TAKE 1 TAB AT HEADACHE ONSET MAY TAKE A 2ND DOSE AFTER 2 HOURS IF NEEDED NO MORE THAN 2 DOSES/24 HRS   3   sucralfate (CARAFATE) 1 g tablet Take 1 tablet (1 g total) by mouth 4 (four) times daily. 120 tablet 0   terbinafine (LAMISIL) 250 MG tablet Take 1 by mouth as directed. 28 tablet 0   venlafaxine XR (EFFEXOR-XR) 75 MG 24 hr capsule Take by mouth.       dicyclomine (BENTYL) 10 MG capsule TAKE 1 CAPSULE (10 MG TOTAL) BY MOUTH 3 (THREE) TIMES DAILY WITH MEALS AS NEEDED FOR SPASMS. 270 capsule 0    No current facility-administered medications for this visit.        Review of Systems Full ROS  was asked and was negative except for the information on the HPI   Physical Exam  CONSTITUTIONAL: NAD EYES: Pupils are equal, round,, Sclera are non-icteric. EARS, NOSE, MOUTH AND THROAT: she is wearing a mask. Hearing is intact to voice. LYMPH NODES:  Lymph nodes in the neck are normal. RESPIRATORY:  Lungs are clear. There is normal respiratory effort, with equal breath sounds bilaterally, and without pathologic use of accessory muscles. CARDIOVASCULAR: Heart is regular without murmurs, no gallops, or rubs. GI: The abdomen is soft, mild diffuse tenderness to palpation right upper quadrant epigastric area.  No peritonitis no Murphy, and nondistended. There are no palpable masses. There is no hepatosplenomegaly. There are normal bowel sounds  GU: Rectal deferred.   MUSCULOSKELETAL: Normal muscle strength and tone. No cyanosis or edema.   SKIN: Turgor is good and there are no pathologic skin lesions or ulcers. NEUROLOGIC: Motor and sensation is grossly normal. Cranial nerves are grossly intact. PSYCH:  Oriented to  person, place and time. Affect is normal.   Data Reviewed   I have personally reviewed the patient's imaging, laboratory findings and medical records.  Assessment/Plan Angela Rocha is a 25 year old female with chronic abdominal pain but without definitive evidence of cholecystitis or biliary dyskinesia.  We had an extensive discussion about her condition.  Potential role for cholecystectomy.  I have also discussed with her the results of the HIDA scan and she understands.  She is very smart.  After lengthy conversation she was asking about the potential for cholecystectomy to improve her symptoms.  I certainly disclose that there is false positives and false negative results associated with any tests including  HIDA scan.  There is certainly possibility that she may improve with cholecystectomy but this is definitely not a guarantee.  She also understands that cholecystectomy may worsens her diarrhea.  After discussion of WHAT IT WOULD entail to have a cholecystectomy she wishes to move forward with procedure. She has also discussed w Dr. Marius Ditch and feels that this is the right step for her. I do think that Robotic chole would be a good approach for her  The risks, benefits, complications, treatment options, and expected outcomes were discussed with the patient. The possibilities of bleeding, recurrent infection, finding a normal gallbladder, perforation of viscus organs, damage to surrounding structures, bile leak, abscess formation, needing a drain placed, the need for additional procedures, reaction to medication, pulmonary aspiration,  failure to diagnose a condition, the possible need to convert to an open procedure, and creating a complication requiring transfusion or operation were discussed with the patient. The patient and/or family concurred with the proposed plan, giving informed consent.       All of the above recommendations were discussed with the patient, and all of patient's questions were  answered to her expressed satisfaction.   Caroleen Hamman, MD [facility] General Surgeon

## 2020-09-10 NOTE — Transfer of Care (Signed)
Immediate Anesthesia Transfer of Care Note  Patient: Angela Rocha  Procedure(s) Performed: XI ROBOTIC ASSISTED LAPAROSCOPIC CHOLECYSTECTOMY  Patient Location: PACU  Anesthesia Type:General  Level of Consciousness: awake, drowsy and patient cooperative  Airway & Oxygen Therapy: Patient Spontanous Breathing and Patient connected to face mask oxygen  Post-op Assessment: Report given to RN and Post -op Vital signs reviewed and stable  Post vital signs: Reviewed and stable  Last Vitals:  Vitals Value Taken Time  BP    Temp    Pulse 70 09/10/20 1415  Resp 21 09/10/20 1415  SpO2 100 % 09/10/20 1415  Vitals shown include unvalidated device data.  Last Pain:  Vitals:   09/10/20 1107  TempSrc: Temporal  PainSc: 6          Complications: No notable events documented.

## 2020-09-10 NOTE — Discharge Instructions (Addendum)
Laparoscopic Cholecystectomy, Care After   These instructions give you information on caring for yourself after your procedure. Your doctor may also give you more specific instructions. Call your doctor if you have any problems or questions after your procedure.  HOME CARE  Change your bandages (dressings) as told by your doctor.  Keep the wound dry and clean. Wash the wound gently with soap and water. Pat the wound dry with a clean towel.  Do not take baths, swim, or use hot tubs for 2 weeks, or as told by your doctor.  Only take medicine as told by your doctor.  Eat a normal diet as told by your doctor.  Do not lift anything heavier than 10 pounds (4.5 kg) until your doctor says it is okay.  Do not play contact sports for 1 week, or as told by your doctor. GET HELP IF:  Your wound is red, puffy (swollen), or painful.  You have yellowish-white fluid (pus) coming from the wound.  You have fluid draining from the wound for more than 1 day.  You have a bad smell coming from the wound.  Your wound breaks open. GET HELP RIGHT AWAY IF:  You have trouble breathing.  You have chest pain.  You have a fever >101  You have pain in the shoulders (shoulder strap areas) that is getting worse.  You feel dizzy or pass out (faint).  You have severe belly (abdominal) pain.  You feel sick to your stomach (nauseous) or throw up (vomit) for more than 1 day.  Laparoscopic Cholecystectomy, Care After   These instructions give you information on caring for yourself after your procedure. Your doctor may also give you more specific instructions. Call your doctor if you have any problems or questions after your procedure.  HOME CARE  Change your bandages (dressings) as told by your doctor.  Keep the wound dry and clean. Wash the wound gently with soap and water. Pat the wound dry with a clean towel.  Do not take baths, swim, or use hot tubs for 2 weeks, or as told by your doctor.  Only take medicine as told by  your doctor.  Eat a normal diet as told by your doctor.  Do not lift anything heavier than 10 pounds (4.5 kg) until your doctor says it is okay.  Do not play contact sports for 1 week, or as told by your doctor. GET HELP IF:  Your wound is red, puffy (swollen), or painful.  You have yellowish-white fluid (pus) coming from the wound.  You have fluid draining from the wound for more than 1 day.  You have a bad smell coming from the wound.  Your wound breaks open. GET HELP RIGHT AWAY IF:  You have trouble breathing.  You have chest pain.  You have a fever >101  You have pain in the shoulders (shoulder strap areas) that is getting worse.  You feel dizzy or pass out (faint).  You have severe belly (abdominal) pain.  You feel sick to your stomach (nauseous) or throw up (vomit) for more than 1 day.      AMBULATORY SURGERY  DISCHARGE INSTRUCTIONS   The drugs that you were given will stay in your system until tomorrow so for the next 24 hours you should not:  Drive an automobile Make any legal decisions Drink any alcoholic beverage   You may resume regular meals tomorrow.  Today it is better to start with liquids and gradually work up to solid foods.  You may eat anything you prefer, but it is better to start with liquids, then soup and crackers, and gradually work up to solid foods.   Please notify your doctor immediately if you have any unusual bleeding, trouble breathing, redness and pain at the surgery site, drainage, fever, or pain not relieved by medication.    Additional Instructions:        Please contact your physician with any problems or Same Day Surgery at [phone], Monday through Friday 6 am to 4 pm, or [place] at [facility] number at [phone].

## 2020-09-10 NOTE — Anesthesia Preprocedure Evaluation (Signed)
Anesthesia Evaluation  Patient identified by MRN, date of birth, ID band Patient awake    Reviewed: Allergy & Precautions, NPO status , Patient's Chart, lab work & pertinent test results  History of Anesthesia Complications Negative for: history of anesthetic complications  Airway Mallampati: I  TM Distance: >3 FB Neck ROM: Full    Dental no notable dental hx. (+) Teeth Intact   Pulmonary asthma , neg sleep apnea, pneumonia, resolved, neg COPD, Patient abstained from smoking.Not current smoker,  Pneumonia last October, not admitted to hospital for it   Pulmonary exam normal breath sounds clear to auscultation       Cardiovascular Exercise Tolerance: Good METS(-) hypertension(-) CAD and (-) Past MI negative cardio ROS  (-) dysrhythmias  Rhythm:Regular Rate:Normal - Systolic murmurs    Neuro/Psych  Headaches, PSYCHIATRIC DISORDERS Anxiety    GI/Hepatic GERD  ,(+)     (-) substance abuse  ,   Endo/Other  neg diabetes  Renal/GU negative Renal ROS     Musculoskeletal  (+) Arthritis ,   Abdominal   Peds  Hematology   Anesthesia Other Findings Past Medical History: No date: ADHD No date: Anxiety No date: Asthma No date: B12 deficiency No date: Femoral neck stress fracture No date: GERD (gastroesophageal reflux disease) No date: Hip dysplasia 03/04/2020: History of dysplastic nevus     Comment:  right upper back, severe/excision No date: Migraine No date: Pelvic congestion syndrome No date: Pyelonephritis No date: Recurrent upper respiratory infection (URI) No date: Urticaria  Reproductive/Obstetrics                             Anesthesia Physical Anesthesia Plan  ASA: 2  Anesthesia Plan: General   Post-op Pain Management:    Induction: Intravenous  PONV Risk Score and Plan: 4 or greater and Ondansetron, Dexamethasone, Midazolam, Treatment may vary due to age or medical  condition and Diphenhydramine  Airway Management Planned: Oral ETT  Additional Equipment: None  Intra-op Plan:   Post-operative Plan: Extubation in OR  Informed Consent: I have reviewed the patients History and Physical, chart, labs and discussed the procedure including the risks, benefits and alternatives for the proposed anesthesia with the patient or authorized representative who has indicated his/her understanding and acceptance.     Dental advisory given  Plan Discussed with: CRNA and Surgeon  Anesthesia Plan Comments: (Patient says she gets itchy and combative after anesthesia. Discussed risks of anesthesia with patient, including PONV, sore throat, lip/dental damage. Rare risks discussed as well, such as cardiorespiratory and neurological sequelae, and allergic reactions. Patient understands.)        Anesthesia Quick Evaluation

## 2020-09-10 NOTE — Anesthesia Procedure Notes (Signed)
Procedure Name: Intubation Date/Time: 09/10/2020 1:11 PM Performed by: Kelton Pillar, CRNA Pre-anesthesia Checklist: Patient identified, Emergency Drugs available, Suction available and Patient being monitored Patient Re-evaluated:Patient Re-evaluated prior to induction Oxygen Delivery Method: Circle system utilized Preoxygenation: Pre-oxygenation with 100% oxygen Induction Type: IV induction Ventilation: Mask ventilation without difficulty Laryngoscope Size: McGraph and 3 Grade View: Grade I Tube type: Oral Number of attempts: 1 Airway Equipment and Method: Stylet and Oral airway Placement Confirmation: ETT inserted through vocal cords under direct vision, positive ETCO2, breath sounds checked- equal and bilateral and CO2 detector Secured at: 21 cm Tube secured with: Tape Dental Injury: Teeth and Oropharynx as per pre-operative assessment

## 2020-09-10 NOTE — Op Note (Signed)
Robotic assisted laparoscopic Cholecystectomy  Pre-operative Diagnosis: Biliary colic  Post-operative Diagnosis: same  Procedure:  Robotic assisted laparoscopic Cholecystectomy  Surgeon: Caroleen Hamman, MD FACS  Anesthesia: Gen. with endotracheal tube  Findings: Omentum adhered to the GB suggesting Chronic mild Cholecystitis   Estimated Blood Loss: 5cc       Specimens: Gallbladder           Complications: none   Procedure Details  The patient was seen again in the Holding Room. The benefits, complications, treatment options, and expected outcomes were discussed with the patient. The risks of bleeding, infection, recurrence of symptoms, failure to resolve symptoms, bile duct damage, bile duct leak, retained common bile duct stone, bowel injury, any of which could require further surgery and/or ERCP, stent, or papillotomy were reviewed with the patient. The likelihood of improving the patient's symptoms with return to their baseline status is good.  The patient and/or family concurred with the proposed plan, giving informed consent.  The patient was taken to Operating Room, identified  and the procedure verified as Laparoscopic Cholecystectomy.  A Time Out was held and the above information confirmed.  Prior to the induction of general anesthesia, antibiotic prophylaxis was administered. VTE prophylaxis was in place. General endotracheal anesthesia was then administered and tolerated well. After the induction, the abdomen was prepped with Chloraprep and draped in the sterile fashion. The patient was positioned in the supine position.  Cut down technique was used to enter the abdominal cavity and a Hasson trochar was placed after two vicryl stitches were anchored to the fascia. Pneumoperitoneum was then created with CO2 and tolerated well without any adverse changes in the patient's vital signs.  Three 8-mm ports were placed under direct vision. All skin incisions  were infiltrated with a  local anesthetic agent before making the incision and placing the trocars.   The patient was positioned  in reverse Trendelenburg, robot was brought to the surgical field and docked in the standard fashion.  We made sure all the instrumentation was kept indirect view at all times and that there were no collision between the arms. I scrubbed out and went to the console.  The gallbladder was identified, the fundus grasped and retracted cephalad. Adhesions were lysed bluntly. The infundibulum was grasped and retracted laterally, exposing the peritoneum overlying the triangle of Calot. This was then divided and exposed in a blunt fashion. An extended critical view of the cystic duct and cystic artery was obtained.  The cystic duct was clearly identified and bluntly dissected.   Artery and duct were double clipped and divided. Using ICG cholangiography we visualize the cystic duct and CBD no evidence of bile injuries encountered. The gallbladder was taken from the gallbladder fossa in a retrograde fashion with the electrocautery.  Hemostasis was achieved with the electrocautery. nspection of the right upper quadrant was performed. No bleeding, bile duct injury or leak, or bowel injury was noted. Robotic instruments and robotic arms were undocked in the standard fashion.  I scrubbed back in.  The gallbladder was removed and placed in an Endocatch bag.   Pneumoperitoneum was released.  The periumbilical port site was closed with interrumpted 0 Vicryl sutures. 4-0 subcuticular Monocryl was used to close the skin. Dermabond was  applied.  The patient was then extubated and brought to the recovery room in stable condition. Sponge, lap, and needle counts were correct at closure and at the conclusion of the case.  Caroleen Hamman, MD, FACS

## 2020-09-11 NOTE — Anesthesia Postprocedure Evaluation (Signed)
Anesthesia Post Note  Patient: Angela Rocha  Procedure(s) Performed: XI ROBOTIC ASSISTED LAPAROSCOPIC CHOLECYSTECTOMY  Patient location during evaluation: PACU Anesthesia Type: General Level of consciousness: awake and alert Pain management: pain level controlled Vital Signs Assessment: post-procedure vital signs reviewed and stable Respiratory status: spontaneous breathing, nonlabored ventilation, respiratory function stable and patient connected to nasal cannula oxygen Cardiovascular status: blood pressure returned to baseline and stable Postop Assessment: no apparent nausea or vomiting Anesthetic complications: no   No notable events documented.   Last Vitals:  Vitals:   09/10/20 1530 09/10/20 1553  BP: 111/80 123/74  Pulse: 81 99  Resp: 15 14  Temp: (!) 36.2 C 36.5 C  SpO2: 95% 98%    Last Pain:  Vitals:   09/10/20 1553  TempSrc: Temporal  PainSc: 4                  Precious Haws Kristain Filo

## 2020-09-15 LAB — SURGICAL PATHOLOGY

## 2020-10-28 ENCOUNTER — Other Ambulatory Visit: Payer: Self-pay | Admitting: Physician Assistant

## 2020-10-28 DIAGNOSIS — R519 Headache, unspecified: Secondary | ICD-10-CM

## 2020-10-28 DIAGNOSIS — R200 Anesthesia of skin: Secondary | ICD-10-CM

## 2020-10-28 DIAGNOSIS — R531 Weakness: Secondary | ICD-10-CM

## 2020-10-28 DIAGNOSIS — R251 Tremor, unspecified: Secondary | ICD-10-CM

## 2020-10-28 DIAGNOSIS — R202 Paresthesia of skin: Secondary | ICD-10-CM

## 2020-10-29 ENCOUNTER — Telehealth: Payer: Self-pay | Admitting: Emergency Medicine

## 2020-10-29 ENCOUNTER — Telehealth: Payer: Self-pay | Admitting: Certified Nurse Midwife

## 2020-10-29 ENCOUNTER — Other Ambulatory Visit: Payer: Self-pay

## 2020-10-29 MED ORDER — BUDESONIDE-FORMOTEROL FUMARATE 160-4.5 MCG/ACT IN AERO: 2.0000 | INHALATION_SPRAY | Freq: Two times a day (BID) | RESPIRATORY_TRACT | 2 refills | Status: DC

## 2020-10-29 NOTE — Telephone Encounter (Signed)
Patient is requesting a refill of Acyclovir sent to [facility] on [address] in [place]

## 2020-10-29 NOTE — Telephone Encounter (Signed)
Refill of the symbicort has been sent to the pharmacy.  Nothing further is needed.

## 2020-10-29 NOTE — Telephone Encounter (Signed)
Messaged pt in regards to refill request, requested rx not listed in pts file

## 2020-11-09 ENCOUNTER — Other Ambulatory Visit: Payer: Self-pay

## 2020-11-09 MED ORDER — VALACYCLOVIR HCL 500 MG PO TABS: 500.0000 mg | ORAL_TABLET | Freq: Two times a day (BID) | ORAL | 0 refills | Status: DC

## 2020-11-26 ENCOUNTER — Ambulatory Visit (INDEPENDENT_AMBULATORY_CARE_PROVIDER_SITE_OTHER): Payer: BC Managed Care – PPO | Admitting: Dermatology

## 2020-11-26 ENCOUNTER — Other Ambulatory Visit: Payer: Self-pay

## 2020-11-26 DIAGNOSIS — B353 Tinea pedis: Secondary | ICD-10-CM | POA: Diagnosis not present

## 2020-11-26 DIAGNOSIS — L821 Other seborrheic keratosis: Secondary | ICD-10-CM

## 2020-11-26 DIAGNOSIS — Z86018 Personal history of other benign neoplasm: Secondary | ICD-10-CM

## 2020-11-26 DIAGNOSIS — D1800 Hemangioma unspecified site: Secondary | ICD-10-CM

## 2020-11-26 DIAGNOSIS — Z1283 Encounter for screening for malignant neoplasm of skin: Secondary | ICD-10-CM | POA: Diagnosis not present

## 2020-11-26 DIAGNOSIS — L814 Other melanin hyperpigmentation: Secondary | ICD-10-CM

## 2020-11-26 DIAGNOSIS — B351 Tinea unguium: Secondary | ICD-10-CM

## 2020-11-26 DIAGNOSIS — D229 Melanocytic nevi, unspecified: Secondary | ICD-10-CM

## 2020-11-26 DIAGNOSIS — B079 Viral wart, unspecified: Secondary | ICD-10-CM

## 2020-11-26 DIAGNOSIS — L578 Other skin changes due to chronic exposure to nonionizing radiation: Secondary | ICD-10-CM

## 2020-11-26 DIAGNOSIS — L700 Acne vulgaris: Secondary | ICD-10-CM

## 2020-11-26 DIAGNOSIS — L219 Seborrheic dermatitis, unspecified: Secondary | ICD-10-CM

## 2020-11-26 DIAGNOSIS — Z79899 Other long term (current) drug therapy: Secondary | ICD-10-CM

## 2020-11-26 MED ORDER — TERBINAFINE HCL 250 MG PO TABS
ORAL_TABLET | ORAL | 0 refills | Status: DC
Start: 2020-11-26 — End: 2021-10-20

## 2020-11-26 MED ORDER — ADAPALENE 0.3 % EX GEL: CUTANEOUS | 3 refills | Status: DC

## 2020-11-26 MED ORDER — KETOCONAZOLE 2 % EX SHAM: MEDICATED_SHAMPOO | CUTANEOUS | 3 refills | Status: DC

## 2020-11-26 MED ORDER — CLOBETASOL PROPIONATE 0.05 % EX SOLN: CUTANEOUS | 3 refills | Status: DC

## 2020-11-26 NOTE — Patient Instructions (Addendum)
Topical steroids (such as triamcinolone, fluocinolone, fluocinonide, mometasone, clobetasol, halobetasol, betamethasone, hydrocortisone) can cause thinning and lightening of the skin if they are used for too long in the same area. Your physician has selected the right strength medicine for your problem and area affected on the body. Please use your medication only as directed by your physician to prevent side effects.   Ok to use Clobetasol 0.05% solution to bug bites twice daily up to two weeks.   Topical retinoid medications like tretinoin/Retin-A, adapalene/Differin, tazarotene/Fabior, and Epiduo/Epiduo Forte can cause dryness and irritation when first started. Only apply a pea-sized amount to the entire affected area. Avoid applying it around the eyes, edges of mouth and creases at the nose. If you experience irritation, use a good moisturizer first and/or apply the medicine less often. If you are doing well with the medicine, you can increase how often you use it until you are applying every night. Be careful with sun protection while using this medication as it can make you sensitive to the sun. This medicine should not be used by pregnant women.   Continue Lamisil 250mg  once daily for one week every other month for three more rounds. First week of December, February, and April.   If You Need Anything After Your Visit  If you have any questions or concerns for your doctor, please call our main line at [phone] and press option 4 to reach your doctor's medical assistant. If no one answers, please leave a voicemail as directed and we will return your call as soon as possible. Messages left after 4 pm will be answered the following business day.   You may also send [place] a message via [place]. We typically respond to MyChart messages within 1-2 business days.  For prescription refills, please ask your pharmacy to contact our office. Our fax number is [phone].  If you have an urgent issue  when the clinic is closed that cannot wait until the next business day, you can page your doctor at the number below.    Please note that while we do our best to be available for urgent issues outside of office hours, we are not available 24/7.   If you have an urgent issue and are unable to reach [place], you may choose to seek medical care at your doctor's office, retail clinic, urgent care center, or emergency room.  If you have a medical emergency, please immediately call 911 or go to the emergency department.  Pager Numbers  - Dr. Nehemiah Massed: [phone]  - Dr. Laurence Ferrari: [phone]  - Dr. Nicole Kindred: [phone]  In the event of inclement weather, please call our main line at [phone] for an update on the status of any delays or closures.  Dermatology Medication Tips: Please keep the boxes that topical medications come in in order to help keep track of the instructions about where and how to use these. Pharmacies typically print the medication instructions only on the boxes and not directly on the medication tubes.   If your medication is too expensive, please contact our office at [phone] option 4 or send [place] a message through [place].   We are unable to tell what your co-pay for medications will be in advance as this is different depending on your insurance coverage. However, we may be able to find a substitute medication at lower cost or fill out paperwork to get insurance to cover a needed medication.   If a prior authorization is required to get your medication covered by  your insurance company, please allow [place] 1-2 business days to complete this process.  Drug prices often vary depending on where the prescription is filled and some pharmacies may offer cheaper prices.  The website www.goodrx.com contains coupons for medications through different pharmacies. The prices here do not account for what the cost may be with help from insurance (it may be cheaper with your insurance),  but the website can give you the price if you did not use any insurance.  - You can print the associated coupon and take it with your prescription to the pharmacy.  - You may also stop by our office during regular business hours and pick up a GoodRx coupon card.  - If you need your prescription sent electronically to a different pharmacy, notify our office through [facility] or by phone at [phone] option 4.     Si Usted Necesita Algo Despus de Su Visita  Tambin puede enviarnos un mensaje a travs de Pharmacist, community. Por lo general respondemos a los mensajes de MyChart en el transcurso de 1 a 2 das hbiles.  Para renovar recetas, por favor pida a su farmacia que se ponga en contacto con nuestra oficina. Harland Dingwall de fax es [place] [phone].  Si tiene un asunto urgente cuando la clnica est cerrada y que no puede esperar hasta el siguiente da hbil, puede llamar/localizar a su doctor(a) al nmero que aparece a continuacin.   Por favor, tenga en cuenta que aunque hacemos todo lo posible para estar disponibles para asuntos urgentes fuera del horario de [place], no estamos disponibles las 24 horas del da, los 7 das de la [place].   Si tiene un problema urgente y no puede comunicarse con nosotros, puede optar por buscar atencin mdica  en el consultorio de su doctor(a), en una clnica privada, en un centro de atencin urgente o en una sala de emergencias.  Si tiene Engineering geologist, por favor llame inmediatamente al 911 o vaya a la sala de emergencias.  Nmeros de bper  - Dr. Nehemiah Massed: [phone]  - Dra. Moye: [phone]  - Dra. Nicole Kindred: [phone]  En caso de inclemencias del [place], por favor llame a Johnsie Kindred principal al [phone] para una actualizacin sobre el [place] de cualquier retraso o cierre.  Consejos para la medicacin en dermatologa: Por favor, guarde las cajas en las que vienen los medicamentos de uso tpico para ayudarle a seguir las  instrucciones sobre dnde y cmo usarlos. Las farmacias generalmente imprimen las instrucciones del medicamento slo en las cajas y no directamente en los tubos del [place].   Si su medicamento es muy caro, por favor, pngase en contacto con Zigmund Daniel llamando al [phone] y presione la opcin 4 o envenos un mensaje a travs de Pharmacist, community.   No podemos decirle cul ser su copago por los medicamentos por adelantado ya que esto es diferente dependiendo de la cobertura de su seguro. Sin embargo, es posible que podamos encontrar un medicamento sustituto a Electrical engineer un formulario para que el seguro cubra el medicamento que se considera necesario.   Si se requiere una autorizacin previa para que su compaa de seguros [place] su medicamento, por favor permtanos de 1 a 2 das hbiles para completar este proceso.  Los precios de los medicamentos varan con frecuencia dependiendo del Environmental consultant de dnde se surte la receta y alguna farmacias pueden ofrecer precios ms baratos.  El sitio web www.goodrx.com tiene cupones para medicamentos de Airline pilot. Los precios aqu no tienen  en cuenta lo que podra costar con la ayuda del seguro (puede ser ms barato con su seguro), pero el sitio web puede darle el precio si no utiliz ningn seguro.  - Puede imprimir el cupn correspondiente y llevarlo con su receta a la farmacia.  - Tambin puede pasar por nuestra oficina durante el horario de atencin regular y recoger una tarjeta de cupones de GoodRx.  - Si necesita que su receta se enve electrnicamente a una farmacia diferente, informe a nuestra oficina a travs de MyChart de [place] o por telfono llamando al [phone] y presione la opcin 4.  

## 2020-11-26 NOTE — Progress Notes (Signed)
Follow-Up Visit   Subjective  Angela Rocha is a 25 y.o. female who presents for the following: Flaky scalp (Has had dry flaky scalp for years and has tried H&S), Annual Exam (Hx dysplastic nevi - patient here today for a full body skin exam.), Nail Problem (Patient sometimes forgets to take the oral Lamisil but has noticed an improvement in the nails when she does use it. ), and tinea pedis (Patient didn't pick up Ciclopirox cream but has been doing the Terbinafine pulse dosing.). The patient presents for Total-Body Skin Exam (TBSE) for skin cancer screening and mole check.  The following portions of the chart were reviewed this encounter and updated as appropriate:       Review of Systems:  No other skin or systemic complaints except as noted in HPI or Assessment and Plan.  Objective  Well appearing patient in no apparent distress; mood and affect are within normal limits.  A full examination was performed including scalp, head, eyes, ears, nose, lips, neck, chest, axillae, abdomen, back, buttocks, bilateral upper extremities, bilateral lower extremities, hands, feet, fingers, toes, fingernails, and toenails. All findings within normal limits unless otherwise noted below.  Scalp Pink patches with greasy scale.   B/L toenails Nails thickened with subungual debris at left foot. Some improvement and some clearance at proximal nail plate  R 5th finger, R thumb (2) Verrucous papules -- Discussed viral etiology and contagion.   Face Trace comedones.    Assessment & Plan  Seborrheic dermatitis Scalp  Chronic condition with duration or expected duration over one year. Condition is bothersome to patient. Currently flared.  Seborrheic Dermatitis  -  is a chronic persistent rash characterized by pinkness and scaling most commonly of the mid face but also can occur on the scalp (dandruff), ears; mid chest, mid back and groin.  It tends to be exacerbated by stress and cooler weather.   People who have neurologic disease may experience new onset or exacerbation of existing seborrheic dermatitis.  The condition is not curable but treatable and can be controlled.  Start Ketoconazole 2% shampoo 2-3 days per week. Let sit 5-10 minutes before washing out.   Start Clobetasol 0.05% solution to aa's QD PRN. Avoid applying to face, groin, and axilla. Use as directed. Long-term use can cause thinning of the skin.  Topical steroids (such as triamcinolone, fluocinolone, fluocinonide, mometasone, clobetasol, halobetasol, betamethasone, hydrocortisone) can cause thinning and lightening of the skin if they are used for too long in the same area. Your physician has selected the right strength medicine for your problem and area affected on the body. Please use your medication only as directed by your physician to prevent side effects.    ketoconazole (NIZORAL) 2 % shampoo - Scalp Shampoo into the scalp let sit 5-10 minutes then wash out. Use 2-3 days per week.  clobetasol (TEMOVATE) 0.05 % external solution - Scalp Apply to aa's scalp QD PRN. Avoid the face, groin, and underarms.  Onychomycosis B/L toenails  Chronic condition with duration of decades. Condition is bothersome to patient. Currently flared.   Past culture grew trichophyton rubrum  Discussed switching to daily terbinafine for possible better clearance versus continuing pulse therapy and rechecking. Pt prefers to continue pulse therapy.    Continue Lamisil 250mg  QD x 1 week every other month for three more rounds. #21 0RF. First week of December, February, and April.   Terbinafine Counseling  Terbinafine is an anti-fungal medicine that can be applied to the skin (over  the counter) or taken by mouth (prescription) to treat fungal infections. The pill version is often used to treat fungal infections of the nails or scalp. While most people do not have any side effects from taking terbinafine pills, some possible side effects of  the medicine can include taste changes, headache, loss of smell, vision changes, nausea, vomiting, or diarrhea.   Rare side effects can include irritation of the liver, allergic reaction, or decrease in blood counts (which may show up as not feeling well or developing an infection). If you are concerned about any of these side effects, please stop the medicine and call your doctor, or in the case of an emergency such as feeling very unwell, seek immediate medical care.    Viral warts, unspecified type (2) R 5th finger, R thumb  Discussed viral etiology and risk of spread.  Discussed multiple treatments may be required to clear warts.  Discussed possible post-treatment dyspigmentation and risk of recurrence.  Acne vulgaris Face  Start Differin 0.3% gel QHS.   Topical retinoid medications like tretinoin/Retin-A, adapalene/Differin, tazarotene/Fabior, and Epiduo/Epiduo Forte can cause dryness and irritation when first started. Only apply a pea-sized amount to the entire affected area. Avoid applying it around the eyes, edges of mouth and creases at the nose. If you experience irritation, use a good moisturizer first and/or apply the medicine less often. If you are doing well with the medicine, you can increase how often you use it until you are applying every night. Be careful with sun protection while using this medication as it can make you sensitive to the sun. This medicine should not be used by pregnant women.    Adapalene (DIFFERIN) 0.3 % gel - Face Apply a pea sized amount to the entire face QHS.  Tinea pedis of right foot  Related Medications terbinafine (LAMISIL) 250 MG tablet Take 1 by mouth as directed.  Lentigines - Scattered tan macules - Due to sun exposure - Benign-appearing, observe - Recommend daily broad spectrum sunscreen SPF 30+ to sun-exposed areas, reapply every 2 hours as needed. - Call for any changes  Seborrheic Keratoses - Stuck-on, waxy, tan-brown papules  and/or plaques  - Benign-appearing - Discussed benign etiology and prognosis. - Observe - Call for any changes  Melanocytic Nevi - Tan-brown and/or pink-flesh-colored symmetric macules and papules - Benign appearing on exam today - Observation - Call clinic for new or changing moles - Recommend daily use of broad spectrum spf 30+ sunscreen to sun-exposed areas.   Hemangiomas - Red papules - Discussed benign nature - Observe - Call for any changes  Actinic Damage - Chronic condition, secondary to cumulative UV/sun exposure - diffuse scaly erythematous macules with underlying dyspigmentation - Recommend daily broad spectrum sunscreen SPF 30+ to sun-exposed areas, reapply every 2 hours as needed.  - Staying in the shade or wearing long sleeves, sun glasses (UVA+UVB protection) and wide brim hats (4-inch brim around the entire circumference of the hat) are also recommended for sun protection.  - Call for new or changing lesions.  History of Dysplastic Nevi - No evidence of recurrence today - Recommend regular full body skin exams - Recommend daily broad spectrum sunscreen SPF 30+ to sun-exposed areas, reapply every 2 hours as needed.  - Call if any new or changing lesions are noted between office visits  Skin cancer screening performed today.  Return in about 1 year (around 11/26/2021) for TBSE., about 6-7 months for recheck onychomycosis  I, Rudell Cobb, CMA, am acting as scribe  for Forest Gleason, MD .  Documentation: I have reviewed the above documentation for accuracy and completeness, and I agree with the above.  Forest Gleason, MD

## 2021-01-06 ENCOUNTER — Ambulatory Visit (INDEPENDENT_AMBULATORY_CARE_PROVIDER_SITE_OTHER): Payer: BC Managed Care – PPO | Admitting: Certified Nurse Midwife

## 2021-01-06 ENCOUNTER — Other Ambulatory Visit: Payer: Self-pay

## 2021-01-06 ENCOUNTER — Encounter: Payer: Self-pay | Admitting: Certified Nurse Midwife

## 2021-01-06 ENCOUNTER — Other Ambulatory Visit (HOSPITAL_COMMUNITY)
Admission: RE | Admit: 2021-01-06 | Discharge: 2021-01-06 | Disposition: A | Discharge status: Home / Self Care | Payer: BC Managed Care – PPO | Source: Ambulatory Visit | Attending: Certified Nurse Midwife | Admitting: Certified Nurse Midwife

## 2021-01-06 VITALS — BP 118/86 | HR 118 | Ht 62.0 in | Wt 178.6 lb

## 2021-01-06 DIAGNOSIS — Z01419 Encounter for gynecological examination (general) (routine) without abnormal findings: Secondary | ICD-10-CM

## 2021-01-06 DIAGNOSIS — Z113 Encounter for screening for infections with a predominantly sexual mode of transmission: Secondary | ICD-10-CM | POA: Diagnosis not present

## 2021-01-06 NOTE — Progress Notes (Signed)
GYNECOLOGY ANNUAL PREVENTATIVE CARE ENCOUNTER NOTE  History:     Angela Rocha is a 25 y.o. G0P0000 female here for a routine annual gynecologic exam.  Current complaints: none.   Denies abnormal vaginal bleeding, discharge, pelvic pain, problems with intercourse or other gynecologic concerns.     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   Gynecologic History Patient's last menstrual period was 12/29/2020 (exact date). Contraception: IUD Last Pap: 12/12/2019. Results were: normal  Last mammogram: n/a .   Obstetric History OB History  Gravida Para Term Preterm AB Living  0 0 0 0 0 0  SAB IAB Ectopic Multiple Live Births  0 0 0 0 0    Past Medical History:  Diagnosis Date   ADHD    Anxiety    Asthma    B12 deficiency    Femoral neck stress fracture    GERD (gastroesophageal reflux disease)    Hip dysplasia    History of dysplastic nevus 03/04/2020   right upper back, severe/excision   Migraine    Pelvic congestion syndrome    Pyelonephritis    Recurrent upper respiratory infection (URI)    Urticaria     Past Surgical History:  Procedure Laterality Date   ADENOIDECTOMY     COLONOSCOPY WITH PROPOFOL N/A 03/23/2020   Procedure: COLONOSCOPY WITH PROPOFOL;  Surgeon: Lin Landsman, MD;  Location: [facility] ENDOSCOPY;  Service: Gastroenterology;  Laterality: N/A;   HIP SURGERY     HARDWARE REMOVAL X2   PERIACETUBLAR Bilateral    OSTEOTOMY   TONSILLECTOMY     TYMPANOSTOMY TUBE PLACEMENT      Current Outpatient Medications on File Prior to Visit  Medication Sig Dispense Refill   Adapalene (DIFFERIN) 0.3 % gel Apply a pea sized amount to the entire face QHS. 45 g 3   albuterol (VENTOLIN HFA) 108 (90 Base) MCG/ACT inhaler Inhale 2 puffs into the lungs every 6 (six) hours as needed for wheezing or shortness of breath. 1 each 4    amphetamine-dextroamphetamine (ADDERALL XR) 15 MG 24 hr capsule Take by mouth.     budesonide-formoterol (SYMBICORT) 160-4.5 MCG/ACT inhaler Inhale 2 puffs into the lungs 2 (two) times daily. 1 each 2   cetirizine (ZYRTEC) 10 MG tablet Take 10 mg by mouth daily.  3   clobetasol (TEMOVATE) 0.05 % external solution Apply to aa's scalp QD PRN. Avoid the face, groin, and underarms. 50 mL 3   cyanocobalamin (,VITAMIN B-12,) 1000 MCG/ML injection INJECT 1 ML (1,000 MCG TOTAL) INTO THE MUSCLE EVERY 30 (THIRTY) DAYS. 10 mL 1   dexmethylphenidate (FOCALIN) 2.5 MG tablet Take 2.5 mg by mouth daily.     EPINEPHrine (AUVI-Q) 0.3 mg/0.3 mL IJ SOAJ injection Inject 0.3 mg into the muscle as needed for anaphylaxis. 1 each 2   fluticasone (FLONASE) 50 MCG/ACT nasal spray Place 1 spray into both nostrils 2 (two) times daily as needed for allergies. 16 g 5   gabapentin (NEURONTIN) 100 MG capsule Take by mouth.     gabapentin (NEURONTIN) 100 MG capsule Take 200 mg by mouth at bedtime.     Galcanezumab-gnlm (EMGALITY) 120 MG/ML SOAJ Inject into the skin.     HYDROcodone-acetaminophen (NORCO/VICODIN) 5-325 MG tablet Take 1-2 tablets by mouth every 4 (four) hours as needed for moderate pain. 20 tablet 0   hydrocortisone 2.5 % cream Apply to affected areas left eye  area twice a day as needed for up to 1 week. 30 g 0   hydrOXYzine (ATARAX/VISTARIL) 25 MG tablet Take 50 mg by mouth daily.     ketoconazole (NIZORAL) 2 % shampoo Shampoo into the scalp let sit 5-10 minutes then wash out. Use 2-3 days per week. 120 mL 3   lamoTRIgine (LAMICTAL) 25 MG tablet Take 50 mg by mouth daily.     levonorgestrel (MIRENA) 20 MCG/24HR IUD by Intrauterine route.     montelukast (SINGULAIR) 10 MG tablet Take by mouth.     ondansetron (ZOFRAN ODT) 4 MG disintegrating tablet Take 1 tablet (4 mg total) by mouth every 8 (eight) hours as needed for nausea or vomiting. 20 tablet 0   rizatriptan (MAXALT-MLT) 10 MG disintegrating tablet Take 10 mg  by mouth as directed. TAKE 1 TAB AT HEADACHE ONSET MAY TAKE A 2ND DOSE AFTER 2 HOURS IF NEEDED NO MORE THAN 2 DOSES/24 HRS  3   terbinafine (LAMISIL) 250 MG tablet Take 1 by mouth as directed. 21 tablet 0   valACYclovir (VALTREX) 500 MG tablet Take 1 tablet (500 mg total) by mouth 2 (two) times daily. 6 tablet 0   dicyclomine (BENTYL) 10 MG capsule TAKE 1 CAPSULE (10 MG TOTAL) BY MOUTH 3 (THREE) TIMES DAILY WITH MEALS AS NEEDED FOR SPASMS. 270 capsule 0   dicyclomine (BENTYL) 10 MG capsule Take 1 capsule (10 mg total) by mouth 4 (four) times daily as needed for up to 14 days for spasms (abdominal pain). 56 capsule 0   No current facility-administered medications on file prior to visit.    Allergies  Allergen Reactions   Doxycycline Nausea And Vomiting    nausea   Amoxicillin-Pot Clavulanate Rash and Other (See Comments)    Has patient had a PCN reaction causing immediate rash, facial/tongue/throat swelling, SOB or lightheadedness with hypotension: Yes Has patient had a PCN reaction causing severe rash involving mucus membranes or skin necrosis: No Has patient had a PCN reaction that required hospitalization: No Has patient had a PCN reaction occurring within the last 10 years: No If all of the above answers are "NO", then may proceed with Cephalosporin use.     Social History:  reports that she has never smoked. She has never used smokeless tobacco. She reports current alcohol use. She reports that she does not use drugs.  Family History  Problem Relation Age of Onset   Diabetes Mellitus II Father    Cancer Father    Colon cancer Father    Cancer Paternal Grandfather    Lung cancer Paternal Grandfather     The following portions of the patient's history were reviewed and updated as appropriate: allergies, current medications, past family history, past medical history, past social history, past surgical history and problem list.  Review of Systems Pertinent items noted in HPI and  remainder of comprehensive ROS otherwise negative.  Physical Exam:  BP 118/86    Pulse (!) 118    Ht 5\' 2"  (1.575 m)    Wt 178 lb 9.6 oz (81 kg)    LMP 12/29/2020 (Exact Date)    BMI 32.67 kg/m  CONSTITUTIONAL: Well-developed, well-nourished female in no acute distress.  HENT:  Normocephalic, atraumatic, External right and left ear normal. Oropharynx is clear and moist EYES: Conjunctivae and EOM are normal. Pupils are equal, round, and reactive to light. No scleral icterus.  NECK: Normal range of motion, supple, no masses.  Normal thyroid.  SKIN: Skin is warm and dry.  No rash noted. Not diaphoretic. No erythema. No pallor. MUSCULOSKELETAL: Normal range of motion. No tenderness.  No cyanosis, clubbing, or edema.  2+ distal pulses. NEUROLOGIC: Alert and oriented to person, place, and time. Normal reflexes, muscle tone coordination.  PSYCHIATRIC: Normal mood and affect. Normal behavior. Normal judgment and thought content. CARDIOVASCULAR: Normal heart rate noted, regular rhythm RESPIRATORY: Clear to auscultation bilaterally. Effort and breath sounds normal, no problems with respiration noted. BREASTS: Symmetric in size. No masses, tenderness, skin changes, nipple drainage, or lymphadenopathy bilaterally.  ABDOMEN: Soft, no distention noted.  No tenderness, rebound or guarding.  PELVIC: Normal appearing external genitalia and urethral meatus; normal appearing vaginal mucosa and cervix.  No abnormal discharge noted.  Pap smear not indicated.  Normal uterine size, no other palpable masses, no uterine or adnexal tenderness.  .   Assessment and Plan:    1. Well woman exam with routine gynecological exam   Pap: not due Mammogram : n/a  Labs:vaginal swab  Refills: none Referral: none Routine preventative health maintenance measures emphasized. Please refer to After Visit Summary for other counseling recommendations.      Philip Aspen, CNM [facility]

## 2021-01-08 LAB — CERVICOVAGINAL ANCILLARY ONLY
Bacterial Vaginitis (gardnerella): NEGATIVE
Candida Glabrata: NEGATIVE
Candida Vaginitis: NEGATIVE
Chlamydia: NEGATIVE
Comment: NEGATIVE
Comment: NEGATIVE
Comment: NEGATIVE
Comment: NEGATIVE
Comment: NEGATIVE
Comment: NORMAL
Neisseria Gonorrhea: NEGATIVE
Trichomonas: NEGATIVE

## 2021-01-18 DIAGNOSIS — S93402A Sprain of unspecified ligament of left ankle, initial encounter: Secondary | ICD-10-CM | POA: Insufficient documentation

## 2021-05-03 ENCOUNTER — Encounter: Payer: Self-pay | Admitting: Certified Nurse Midwife

## 2021-08-02 DIAGNOSIS — Z800 Family history of malignant neoplasm of digestive organs: Secondary | ICD-10-CM | POA: Insufficient documentation

## 2021-10-20 ENCOUNTER — Ambulatory Visit: Payer: BC Managed Care – PPO | Admitting: Podiatry

## 2021-10-20 ENCOUNTER — Encounter: Payer: Self-pay | Admitting: Podiatry

## 2021-10-20 DIAGNOSIS — L600 Ingrowing nail: Secondary | ICD-10-CM | POA: Diagnosis not present

## 2021-10-20 DIAGNOSIS — B351 Tinea unguium: Secondary | ICD-10-CM

## 2021-10-20 MED ORDER — NEOMYCIN-POLYMYXIN-HC 1 % OT SOLN: OTIC | 1 refills | Status: DC

## 2021-10-20 MED ORDER — TERBINAFINE HCL 250 MG PO TABS: 250.0000 mg | ORAL_TABLET | Freq: Every day | ORAL | 0 refills | Status: DC

## 2021-10-20 NOTE — Patient Instructions (Signed)

## 2021-10-21 NOTE — Progress Notes (Signed)
Subjective:  Patient ID: Angela Rocha, female    DOB: [birth year],  MRN: [id number] HPI Chief Complaint  Patient presents with   Nail Problem    Hallux left - medial border, tender, red, swollen x few days, concerned about nail fungus, dermatologist has her on terbinafine on a pulse dose, but doesn't always remember to take it   New Patient (Initial Visit)    26 y.o. female presents with the above complaint.   ROS: Denies fever chills nausea vomit muscle aches pains calf pain back pain chest pain shortness of breath.  Past Medical History:  Diagnosis Date   ADHD    Anxiety    Asthma    B12 deficiency    Femoral neck stress fracture    GERD (gastroesophageal reflux disease)    Hip dysplasia    History of dysplastic nevus 03/04/2020   right upper back, severe/excision   Migraine    Pelvic congestion syndrome    Pyelonephritis    Recurrent upper respiratory infection (URI)    Urticaria    Past Surgical History:  Procedure Laterality Date   ADENOIDECTOMY     COLONOSCOPY WITH PROPOFOL N/A 03/23/2020   Procedure: COLONOSCOPY WITH PROPOFOL;  Surgeon: Lin Landsman, MD;  Location: [facility] ENDOSCOPY;  Service: Gastroenterology;  Laterality: N/A;   HIP SURGERY     HARDWARE REMOVAL X2   PERIACETUBLAR Bilateral    OSTEOTOMY   TONSILLECTOMY     TYMPANOSTOMY TUBE PLACEMENT      Current Outpatient Medications:    NEOMYCIN-POLYMYXIN-HYDROCORTISONE (CORTISPORIN) 1 % SOLN OTIC solution, Apply 1-2 drops to toe BID after soaking, Disp: 10 mL, Rfl: 1   terbinafine (LAMISIL) 250 MG tablet, Take 1 tablet (250 mg total) by mouth daily., Disp: 30 tablet, Rfl: 0   Adapalene (DIFFERIN) 0.3 % gel, Apply a pea sized amount to the entire face QHS., Disp: 45 g, Rfl: 3   albuterol (VENTOLIN HFA) 108 (90 Base) MCG/ACT inhaler, Inhale 2 puffs into the lungs every 6 (six) hours as needed for wheezing or shortness of breath., Disp: 1 each, Rfl: 4   budesonide-formoterol (SYMBICORT) 160-4.5 MCG/ACT  inhaler, Inhale 2 puffs into the lungs 2 (two) times daily., Disp: 1 each, Rfl: 2   busPIRone (BUSPAR) 10 MG tablet, Take 10 mg by mouth 2 (two) times daily as needed., Disp: , Rfl:    cetirizine (ZYRTEC) 10 MG tablet, Take 10 mg by mouth daily., Disp: , Rfl: 3   clobetasol (TEMOVATE) 0.05 % external solution, Apply to aa's scalp QD PRN. Avoid the face, groin, and underarms., Disp: 50 mL, Rfl: 3   cloNIDine (CATAPRES) 0.1 MG tablet, Take 0.1 mg by mouth 2 (two) times daily., Disp: , Rfl:    cyanocobalamin (,VITAMIN B-12,) 1000 MCG/ML injection, INJECT 1 ML (1,000 MCG TOTAL) INTO THE MUSCLE EVERY 30 (THIRTY) DAYS., Disp: 10 mL, Rfl: 1   fluticasone (FLONASE) 50 MCG/ACT nasal spray, Place 1 spray into both nostrils 2 (two) times daily as needed for allergies., Disp: 16 g, Rfl: 5   gabapentin (NEURONTIN) 300 MG capsule, Take by mouth., Disp: , Rfl:    Galcanezumab-gnlm (EMGALITY) 120 MG/ML SOAJ, Inject into the skin., Disp: , Rfl:    hydrocortisone 2.5 % cream, Apply to affected areas left eye area twice a day as needed for up to 1 week., Disp: 30 g, Rfl: 0   ketoconazole (NIZORAL) 2 % shampoo, Shampoo into the scalp let sit 5-10 minutes then wash out. Use 2-3 days per week., Disp:  120 mL, Rfl: 3   levonorgestrel (MIRENA) 20 MCG/24HR IUD, by Intrauterine route., Disp: , Rfl:    lisdexamfetamine (VYVANSE) 50 MG capsule, Take 50 mg by mouth daily., Disp: , Rfl:    montelukast (SINGULAIR) 10 MG tablet, Take 10 mg by mouth at bedtime., Disp: , Rfl:    ondansetron (ZOFRAN ODT) 4 MG disintegrating tablet, Take 1 tablet (4 mg total) by mouth every 8 (eight) hours as needed for nausea or vomiting., Disp: 20 tablet, Rfl: 0   rizatriptan (MAXALT-MLT) 10 MG disintegrating tablet, Take 10 mg by mouth as directed. TAKE 1 TAB AT HEADACHE ONSET MAY TAKE A 2ND DOSE AFTER 2 HOURS IF NEEDED NO MORE THAN 2 DOSES/24 HRS, Disp: , Rfl: 3   sertraline (ZOLOFT) 100 MG tablet, Take 150 mg by mouth daily., Disp: , Rfl:     valACYclovir (VALTREX) 500 MG tablet, Take 1 tablet (500 mg total) by mouth 2 (two) times daily., Disp: 6 tablet, Rfl: 0  Allergies  Allergen Reactions   Doxycycline Nausea And Vomiting    nausea   Amoxicillin-Pot Clavulanate Rash and Other (See Comments)    Has patient had a PCN reaction causing immediate rash, facial/tongue/throat swelling, SOB or lightheadedness with hypotension: Yes Has patient had a PCN reaction causing severe rash involving mucus membranes or skin necrosis: No Has patient had a PCN reaction that required hospitalization: No Has patient had a PCN reaction occurring within the last 10 years: No If all of the above answers are "NO", then may proceed with Cephalosporin use.    Penicillins Rash   Review of Systems Objective:  There were no vitals filed for this visit.  General: Well developed, nourished, in no acute distress, alert and oriented x3   Dermatological: Skin is warm, dry and supple bilateral. Nails x 10 are well maintained; remaining integument appears unremarkable at this time. There are no open sores, no preulcerative lesions, no rash or signs of infection present.  Ingrown toenail tibial border hallux left.  She also demonstrates what appears to be onychomycosis provided me a report from dermatology saying that it was Trichophyton rubrum.  Vascular: Dorsalis Pedis artery and Posterior Tibial artery pedal pulses are 2/4 bilateral with immedate capillary fill time. Pedal hair growth present. No varicosities and no lower extremity edema present bilateral.   Neruologic: Grossly intact via light touch bilateral. Vibratory intact via tuning fork bilateral. Protective threshold with Semmes Wienstein monofilament intact to all pedal sites bilateral. Patellar and Achilles deep tendon reflexes 2+ bilateral. No Babinski or clonus noted bilateral.   Musculoskeletal: No gross boney pedal deformities bilateral. No pain, crepitus, or limitation noted with foot and ankle  range of motion bilateral. Muscular strength 5/5 in all groups tested bilateral.  Gait: Unassisted, Nonantalgic.    Radiographs:  None taken  Assessment & Plan:   Assessment: Ingrown toenail tibial border hallux left.  Onychomycosis with T rubrum left foot.  Plan: Discussed etiology pathology conservative versus surgical therapies.  At this point performed chemical matricectomy to the tibial border tolerated procedure well without complications.  Was given both oral and I will instruction for care and soaking of the foot as well as a prescription Cortisporin Otic to be applied twice daily after soaking.  We will start her on terbinafine 250 mg tablets 1 p.o. daily 30 in number and we will follow-up with her in 1 month for a liver check.     Dulse Rutan T. [place], [place]

## 2021-11-15 ENCOUNTER — Other Ambulatory Visit: Payer: Self-pay

## 2021-11-17 ENCOUNTER — Ambulatory Visit: Payer: BC Managed Care – PPO | Admitting: Podiatry

## 2021-11-17 ENCOUNTER — Encounter: Payer: Self-pay | Admitting: Podiatry

## 2021-11-17 DIAGNOSIS — Z9889 Other specified postprocedural states: Secondary | ICD-10-CM

## 2021-11-17 DIAGNOSIS — L600 Ingrowing nail: Secondary | ICD-10-CM

## 2021-11-17 DIAGNOSIS — B351 Tinea unguium: Secondary | ICD-10-CM | POA: Diagnosis not present

## 2021-11-17 MED ORDER — TERBINAFINE HCL 250 MG PO TABS: 250.0000 mg | ORAL_TABLET | Freq: Every day | ORAL | 0 refills | Status: DC

## 2021-11-17 NOTE — Progress Notes (Signed)
She presents today for follow-up of her matrixectomy hallux left states is doing just great no problems whatsoever she denies fever chills nausea mobic muscle aches and pains associated with the use of the Lamisil no rashes or itching.  Objective: Hallux left appears to be healing very nicely there is no purulence no malodor no tenderness.  Assessment: Well-healing surgical toe hallux left.  Long-term therapy with Lamisil for onychomycosis.  Plan: At this point we are requesting blood work complete metabolic panel.  Should this come back abnormal we will notify her immediately otherwise I will go ahead and write a prescription for another 90 tablets of Lamisil.

## 2021-11-18 ENCOUNTER — Telehealth: Payer: Self-pay | Admitting: *Deleted

## 2021-11-18 LAB — COMPREHENSIVE METABOLIC PANEL
ALT: 13 IU/L (ref 0–32)
AST: 15 IU/L (ref 0–40)
Albumin/Globulin Ratio: 2.3 — ABNORMAL HIGH (ref 1.2–2.2)
Albumin: 4.6 g/dL (ref 4.0–5.0)
Alkaline Phosphatase: 106 IU/L (ref 44–121)
BUN/Creatinine Ratio: 11 (ref 9–23)
BUN: 9 mg/dL (ref 6–20)
Bilirubin Total: 0.3 mg/dL (ref 0.0–1.2)
CO2: 27 mmol/L (ref 20–29)
Calcium: 9.5 mg/dL (ref 8.7–10.2)
Chloride: 103 mmol/L (ref 96–106)
Creatinine, Ser: 0.81 mg/dL (ref 0.57–1.00)
Globulin, Total: 2 g/dL (ref 1.5–4.5)
Glucose: 100 mg/dL — ABNORMAL HIGH (ref 70–99)
Potassium: 4.4 mmol/L (ref 3.5–5.2)
Sodium: 140 mmol/L (ref 134–144)
Total Protein: 6.6 g/dL (ref 6.0–8.5)
eGFR: 104 mL/min/{1.73_m2} (ref >59)

## 2021-11-18 NOTE — Telephone Encounter (Signed)
-----   Message from Garrel Ridgel, [place] sent at 11/18/2021  9:17 AM EST ----- Blood work  looks good and may continue medication.

## 2021-12-09 ENCOUNTER — Encounter: Payer: BC Managed Care – PPO | Admitting: Dermatology

## 2021-12-20 IMAGING — NM NM HEPATO W/GB/PHARM/[PERSON_NAME]
2 series · 12 of 12 positions shown · non-contrast
Comparison: Ultrasound August 08, 2020

CLINICAL DATA: Abdominal pain.

EXAM:
NUCLEAR MEDICINE HEPATOBILIARY IMAGING WITH GALLBLADDER EF
TECHNIQUE: Sequential images of the abdomen were obtained [DATE] minutes
following intravenous administration of radiopharmaceutical. After
oral ingestion of Ensure, gallbladder ejection fraction was
determined. At 60 min, normal ejection fraction is greater than 33%.
RADIOPHARMACEUTICALS:  5.07 mCi 7c-XXm  Choletec IV

[Series 1000: hepatobiliary scan · 9.59mm/px · 6 of 60 frames shown]
[frame 6/60]
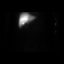
[frame 16/60]
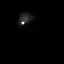
[frame 26/60]
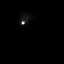
[frame 36/60]
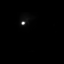
[frame 46/60]
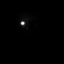
[frame 56/60]
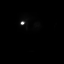

[Series 1000: gallbladder ef · 4.80mm/px · 6 of 120 frames shown]
[frame 11/120]
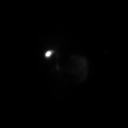
[frame 31/120]
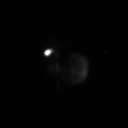
[frame 51/120]
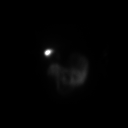
[frame 71/120]
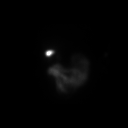
[frame 91/120]
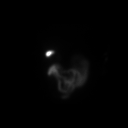
[frame 111/120]
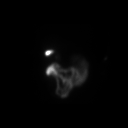

[12 of 12 positions shown; findings below may reference images not displayed]

FINDINGS: There is prompt, uniform radiotracer uptake by the liver with normal
filling of the intrahepatic ducts, common bile duct. Gallbladder
activity is visualized, consistent with patency of cystic duct
(normal < 60 minutes). Additionally there is normal biliary to bowel
transit (normal < 60 minutes), consistent with patent common bile
duct.

Ensure was administered and the gallbladder appears to empty
normally on sequential images. Calculated gallbladder ejection
fraction is 79%%. (Normal gallbladder ejection fraction with Ensure
is greater than 33%.). Patient endorses 6-7/10 abdominal pain post
ingestion of Ensure.

No evidence of enterogastric biliary reflux.
IMPRESSION: 1.  Patent cystic and common bile ducts.

2.  Normal gallbladder ejection fraction.

## 2021-12-23 ENCOUNTER — Telehealth: Payer: Self-pay | Admitting: Certified Nurse Midwife

## 2021-12-23 NOTE — Telephone Encounter (Signed)
Called patient to reschedule appt that was schedule on 01-11-2021 with [facility]. Asked patient to call the office back so we can get rescheduled for annual appt.

## 2022-01-07 NOTE — Telephone Encounter (Signed)
Patient returned to 01/27/22 with Missy

## 2022-01-11 ENCOUNTER — Encounter: Payer: BC Managed Care – PPO | Admitting: Certified Nurse Midwife

## 2022-01-18 ENCOUNTER — Ambulatory Visit (INDEPENDENT_AMBULATORY_CARE_PROVIDER_SITE_OTHER): Payer: BC Managed Care – PPO | Admitting: Dermatology

## 2022-01-18 DIAGNOSIS — L304 Erythema intertrigo: Secondary | ICD-10-CM

## 2022-01-18 DIAGNOSIS — R232 Flushing: Secondary | ICD-10-CM

## 2022-01-18 DIAGNOSIS — B078 Other viral warts: Secondary | ICD-10-CM

## 2022-01-18 DIAGNOSIS — D229 Melanocytic nevi, unspecified: Secondary | ICD-10-CM

## 2022-01-18 DIAGNOSIS — L700 Acne vulgaris: Secondary | ICD-10-CM | POA: Diagnosis not present

## 2022-01-18 DIAGNOSIS — L821 Other seborrheic keratosis: Secondary | ICD-10-CM

## 2022-01-18 DIAGNOSIS — L219 Seborrheic dermatitis, unspecified: Secondary | ICD-10-CM | POA: Diagnosis not present

## 2022-01-18 DIAGNOSIS — Z1283 Encounter for screening for malignant neoplasm of skin: Secondary | ICD-10-CM | POA: Diagnosis not present

## 2022-01-18 DIAGNOSIS — L814 Other melanin hyperpigmentation: Secondary | ICD-10-CM

## 2022-01-18 DIAGNOSIS — L578 Other skin changes due to chronic exposure to nonionizing radiation: Secondary | ICD-10-CM

## 2022-01-18 DIAGNOSIS — Z86018 Personal history of other benign neoplasm: Secondary | ICD-10-CM

## 2022-01-18 MED ORDER — AKLIEF 0.005 % EX CREA: TOPICAL_CREAM | CUTANEOUS | 5 refills | Status: DC

## 2022-01-18 MED ORDER — PIMECROLIMUS 1 % EX CREA: TOPICAL_CREAM | CUTANEOUS | 0 refills | Status: DC

## 2022-01-18 MED ORDER — CLOBETASOL PROPIONATE 0.05 % EX SOLN: CUTANEOUS | 3 refills | Status: AC

## 2022-01-18 NOTE — Progress Notes (Signed)
Follow-Up Visit   Subjective  Angela Rocha is a 27 y.o. female who presents for the following: FBSE (Hx dysplastic nevi.), Acne (Patient currently using adapalene 0.3% gel every other night. Patient would like to know if there's anything else she can use to keep her skin looking clear, she is sensitive. She has noticed redness and heat at her face. ), Seborrheic Dermatitis (Patient currently uses ketoconazole 2% shampoo and clobetasol solution. ), and Nail Problem (Patient has done a course of terbinafine. Her podiatrist is treating her now with terbinafine and checking blood work. ).  The patient presents for Total-Body Skin Exam (TBSE) for skin cancer screening and mole check.  The patient has spots, moles and lesions to be evaluated, some may be new or changing and the patient has concerns that these could be cancer.   The following portions of the chart were reviewed this encounter and updated as appropriate:   Tobacco  Allergies  Meds  Problems  Med Hx  Surg Hx  Fam Hx      Review of Systems:  No other skin or systemic complaints except as noted in HPI or Assessment and Plan.  Objective  Well appearing patient in no apparent distress; mood and affect are within normal limits.  A full examination was performed including scalp, head, eyes, ears, nose, lips, neck, chest, axillae, abdomen, back, buttocks, bilateral upper extremities, bilateral lower extremities, hands, feet, fingers, toes, fingernails, and toenails. All findings within normal limits unless otherwise noted below.  face Rare open comedone  Scalp Mild scale and erythema  right 5th finger Verrucous papules -- Discussed viral etiology and contagion.   Left Axilla Erythematous patches  face     Assessment & Plan  Acne vulgaris face  Chronic condition with duration over one year. Currently well-controlled but with side effect (irritation) from medication.   D/c adapalene   Start Aklief nightly to  face as tolerated. Topical retinoid medications like tretinoin/Retin-A, adapalene/Differin, tazarotene/Fabior, and Epiduo/Epiduo Forte can cause dryness and irritation when first started. Only apply a pea-sized amount to the entire affected area. Avoid applying it around the eyes, edges of mouth and creases at the nose. If you experience irritation, use a good moisturizer first and/or apply the medicine less often. If you are doing well with the medicine, you can increase how often you use it until you are applying every night. Be careful with sun protection while using this medication as it can make you sensitive to the sun. This medicine should not be used by pregnant women.    Trifarotene (AKLIEF) 0.005 % CREA - face Apply to face nightly as tolerated  Seborrheic dermatitis Scalp  Chronic and persistent condition with duration over one year. Condition is symptomatic/ bothersome to patient. Not currently at goal.  D/c ketoconazole shampoo (not using often since affecting hair color) and switch to color safe dandruff shampoo. Leave in 10 minutes 3 times per week.   Continue clobetasol solution to spot treat areas as needed for itch. Avoid applying to face, groin, and axilla. Use as directed. Long-term use can cause thinning of the skin.  Topical steroids (such as triamcinolone, fluocinolone, fluocinonide, mometasone, clobetasol, halobetasol, betamethasone, hydrocortisone) can cause thinning and lightening of the skin if they are used for too long in the same area. Your physician has selected the right strength medicine for your problem and area affected on the body. Please use your medication only as directed by your physician to prevent side effects.  Related Medications clobetasol (TEMOVATE) 0.05 % external solution Apply to aa's scalp QD PRN. Avoid the face, groin, and underarms.  Other viral warts right 5th finger  Viral Wart (HPV) Counseling  Discussed viral / HPV (Human  Papilloma Virus) etiology and risk of spread /infectivity to other areas of body as well as to other people.  Multiple treatments and methods may be required to clear warts and it is possible treatment may not be successful.  Treatment risks include discoloration; scarring and there is still potential for wart recurrence.  Prior to procedure, discussed risks of blister formation, small wound, skin dyspigmentation, or rare scar following cryotherapy. Recommend Vaseline ointment to treated areas while healing.  Squaric Acid 3% applied to warts today. Prior to application reviewed risk of inflammation and irritation.  Cantharidin Plus is a blistering agent that comes from a beetle.  It needs to be washed off in about 4 hours after application.  Although it is painless when applied in office, it may cause symptoms of mild pain and burning several hours later.  Treated areas will swell and turn red, and blisters may form.  Vaseline and a bandaid may be applied until wound has healed.  Once healed, the skin may remain temporarily discolored.  It can take weeks to months for pigmentation to return to normal.  Advised to wash off with soap and water in 4 hours or sooner if it becomes tender before then.    Destruction of lesion - right 5th finger  Destruction method: cryotherapy   Informed consent: discussed and consent obtained   Lesion destroyed using liquid nitrogen: Yes   Cryotherapy cycles:  2 Outcome: patient tolerated procedure well with no complications   Post-procedure details: wound care instructions given    Destruction of lesion - right 5th finger  Destruction method: chemical removal   Informed consent: discussed and consent obtained   Timeout:  patient name, date of birth, surgical site, and procedure verified Chemical destruction method comment:  Cantharidin plus, squaric acid 3% Application time:  4 hours Procedure instructions: patient instructed to wash and dry area   Outcome:  patient tolerated procedure well with no complications   Post-procedure details: wound care instructions given   Additional details:  Patient advised to set alarm to remind them to wash off with soap and water at the directed time.  Erythema intertrigo Left Axilla  Vs contact dermatis at axillae  Start Elidel 1-2 times daily as needed for rash  pimecrolimus (ELIDEL) 1 % cream - Left Axilla Apply 1-2 times daily as needed to rash at face and axilla.  Flushing face  History of facial flushing at cheeks, seeming to spare NL folds in photos  Also reports flushing at neck and some wheezing or breathing changes sometimes.  Will order 5HIAA screening for carcinoid syndrome.  Consider ANA if 5HIAA WNL.  If testing normal, plan to monitor and do trial of pimecrolimus and possibly rhofade  Related Procedures 5 HIAA w/Creatinine, 24 hr.   History of Dysplastic Nevi - No evidence of recurrence today at right upper back - Recommend regular full body skin exams - Recommend daily broad spectrum sunscreen SPF 30+ to sun-exposed areas, reapply every 2 hours as needed.  - Call if any new or changing lesions are noted between office visits  Lentigines - Scattered tan macules - Due to sun exposure - Benign-appearing, observe - Recommend daily broad spectrum sunscreen SPF 30+ to sun-exposed areas, reapply every 2 hours as needed. - Call for  any changes  Seborrheic Keratoses - Stuck-on, waxy, tan-brown papules and/or plaques  - Benign-appearing - Discussed benign etiology and prognosis. - Observe - Call for any changes  Melanocytic Nevi - Tan-brown and/or pink-flesh-colored symmetric macules and papules - Benign appearing on exam today - Observation - Call clinic for new or changing moles - Recommend daily use of broad spectrum spf 30+ sunscreen to sun-exposed areas.   Hemangiomas - Red papules - Discussed benign nature - Observe - Call for any changes  Actinic Damage -  Chronic condition, secondary to cumulative UV/sun exposure - diffuse scaly erythematous macules with underlying dyspigmentation - Recommend daily broad spectrum sunscreen SPF 30+ to sun-exposed areas, reapply every 2 hours as needed.  - Staying in the shade or wearing long sleeves, sun glasses (UVA+UVB protection) and wide brim hats (4-inch brim around the entire circumference of the hat) are also recommended for sun protection.  - Call for new or changing lesions.  Skin cancer screening performed today.  Return in about 1 year (around 01/19/2023) for TBSE, Hx Dysplastic Nevi, 1 month follow up.  Graciella Belton, RMA, am acting as scribe for Forest Gleason, MD .  Documentation: I have reviewed the above documentation for accuracy and completeness, and I agree with the above.  Forest Gleason, MD

## 2022-01-18 NOTE — Patient Instructions (Addendum)
Discontinue ketoconazole shampoo and switch to color safe dandruff shampoo. Continue clobetasol solution to spot treat areas as needed for itch. Avoid applying to face, groin, and axilla. Use as directed. Long-term use can cause thinning of the skin.  Topical steroids (such as triamcinolone, fluocinolone, fluocinonide, mometasone, clobetasol, halobetasol, betamethasone, hydrocortisone) can cause thinning and lightening of the skin if they are used for too long in the same area. Your physician has selected the right strength medicine for your problem and area affected on the body. Please use your medication only as directed by your physician to prevent side effects.   Instructions for After In-Office Application of Cantharidin  1. This is a strong medicine; please follow ALL instructions.  2. Gently wash off with soap and water in four hours or sooner s directed by your physician.  3. **WARNING** this medicine can cause severe blistering, blood blisters, infection, and/or scarring if it is not washed off as directed.  4. Your progress will be rechecked in 1-2 months; call sooner if there are any questions or problems.  Cantharidin Plus is a blistering agent that comes from a beetle.  It needs to be washed off in about 4 hours after application.  Although it is painless when applied in office, it may cause symptoms of mild pain and burning several hours later.  Treated areas will swell and turn red, and blisters may form.  Vaseline and a bandaid may be applied until wound has healed.  Once healed, the skin may remain temporarily discolored.  It can take weeks to months for pigmentation to return to normal.  Advised to wash off with soap and water in 4 hours or sooner if it becomes tender before then.  Melanoma ABCDEs  Melanoma is the most dangerous type of skin cancer, and is the leading cause of death from skin disease.  You are more likely to develop melanoma if you: Have light-colored skin,  light-colored eyes, or red or blond hair Spend a lot of time in the sun Tan regularly, either outdoors or in a tanning bed Have had blistering sunburns, especially during childhood Have a close family member who has had a melanoma Have atypical moles or large birthmarks  Early detection of melanoma is key since treatment is typically straightforward and cure rates are extremely high if we catch it early.   The first sign of melanoma is often a change in a mole or a new dark spot.  The ABCDE system is a way of remembering the signs of melanoma.  A for asymmetry:  The two halves do not match. B for border:  The edges of the growth are irregular. C for color:  A mixture of colors are present instead of an even brown color. D for diameter:  Melanomas are usually (but not always) greater than 6m - the size of a pencil eraser. E for evolution:  The spot keeps changing in size, shape, and color.  Please check your skin once per month between visits. You can use a small mirror in front and a large mirror behind you to keep an eye on the back side or your body.   If you see any new or changing lesions before your next follow-up, please call to schedule a visit.  Please continue daily skin protection including broad spectrum sunscreen SPF 30+ to sun-exposed areas, reapplying every 2 hours as needed when you're outdoors.    Due to recent changes in healthcare laws, you may see results of your pathology  and/or laboratory studies on MyChart before the doctors have had a chance to review them. We understand that in some cases there may be results that are confusing or concerning to you. Please understand that not all results are received at the same time and often the doctors may need to interpret multiple results in order to provide you with the best plan of care or course of treatment. Therefore, we ask that you please give [place] 2 business days to thoroughly review all your results before contacting the  office for clarification. Should we see a critical lab result, you will be contacted sooner.   If You Need Anything After Your Visit  If you have any questions or concerns for your doctor, please call our main line at [phone] and press option 4 to reach your doctor's medical assistant. If no one answers, please leave a voicemail as directed and we will return your call as soon as possible. Messages left after 4 pm will be answered the following business day.   You may also send [place] a message via [place]. We typically respond to MyChart messages within 1-2 business days.  For prescription refills, please ask your pharmacy to contact our office. Our fax number is [phone].  If you have an urgent issue when the clinic is closed that cannot wait until the next business day, you can page your doctor at the number below.    Please note that while we do our best to be available for urgent issues outside of office hours, we are not available 24/7.   If you have an urgent issue and are unable to reach [place], you may choose to seek medical care at your doctor's office, retail clinic, urgent care center, or emergency room.  If you have a medical emergency, please immediately call 911 or go to the emergency department.  Pager Numbers  - Dr. Nehemiah Massed: [phone]  - Dr. Laurence Ferrari: [phone]  - Dr. Nicole Kindred: [phone]  In the event of inclement weather, please call our main line at [phone] for an update on the status of any delays or closures.  Dermatology Medication Tips: Please keep the boxes that topical medications come in in order to help keep track of the instructions about where and how to use these. Pharmacies typically print the medication instructions only on the boxes and not directly on the medication tubes.   If your medication is too expensive, please contact our office at [phone] option 4 or send [place] a message through [place].   We are unable to tell what your co-pay  for medications will be in advance as this is different depending on your insurance coverage. However, we may be able to find a substitute medication at lower cost or fill out paperwork to get insurance to cover a needed medication.   If a prior authorization is required to get your medication covered by your insurance company, please allow [place] 1-2 business days to complete this process.  Drug prices often vary depending on where the prescription is filled and some pharmacies may offer cheaper prices.  The website www.goodrx.com contains coupons for medications through different pharmacies. The prices here do not account for what the cost may be with help from insurance (it may be cheaper with your insurance), but the website can give you the price if you did not use any insurance.  - You can print the associated coupon and take it with your prescription to the pharmacy.  - You may also stop by our  office during regular business hours and pick up a GoodRx coupon card.  - If you need your prescription sent electronically to a different pharmacy, notify our office through [facility] or by phone at [phone] option 4.     Si Usted Necesita Algo Despus de Su Visita  Tambin puede enviarnos un mensaje a travs de Pharmacist, community. Por lo general respondemos a los mensajes de MyChart en el transcurso de 1 a 2 das hbiles.  Para renovar recetas, por favor pida a su farmacia que se ponga en contacto con nuestra oficina. Harland Dingwall de fax es [place] [phone].  Si tiene un asunto urgente cuando la clnica est cerrada y que no puede esperar hasta el siguiente da hbil, puede llamar/localizar a su doctor(a) al nmero que aparece a continuacin.   Por favor, tenga en cuenta que aunque hacemos todo lo posible para estar disponibles para asuntos urgentes fuera del horario de [place], no estamos disponibles las 24 horas del da, los 7 das de la [place].   Si tiene un problema urgente y no puede  comunicarse con nosotros, puede optar por buscar atencin mdica  en el consultorio de su doctor(a), en una clnica privada, en un centro de atencin urgente o en una sala de emergencias.  Si tiene Engineering geologist, por favor llame inmediatamente al 911 o vaya a la sala de emergencias.  Nmeros de bper  - Dr. Nehemiah Massed: [phone]  - Dra. Moye: [phone]  - Dra. Nicole Kindred: [phone]  En caso de inclemencias del [place], por favor llame a Johnsie Kindred principal al [phone] para una actualizacin sobre el [place] de cualquier retraso o cierre.  Consejos para la medicacin en dermatologa: Por favor, guarde las cajas en las que vienen los medicamentos de uso tpico para ayudarle a seguir las instrucciones sobre dnde y cmo usarlos. Las farmacias generalmente imprimen las instrucciones del medicamento slo en las cajas y no directamente en los tubos del [place].   Si su medicamento es muy caro, por favor, pngase en contacto con Zigmund Daniel llamando al [phone] y presione la opcin 4 o envenos un mensaje a travs de Pharmacist, community.   No podemos decirle cul ser su copago por los medicamentos por adelantado ya que esto es diferente dependiendo de la cobertura de su seguro. Sin embargo, es posible que podamos encontrar un medicamento sustituto a Electrical engineer un formulario para que el seguro cubra el medicamento que se considera necesario.   Si se requiere una autorizacin previa para que su compaa de seguros [place] su medicamento, por favor permtanos de 1 a 2 das hbiles para completar este proceso.  Los precios de los medicamentos varan con frecuencia dependiendo del Environmental consultant de dnde se surte la receta y alguna farmacias pueden ofrecer precios ms baratos.  El sitio web www.goodrx.com tiene cupones para medicamentos de Airline pilot. Los precios aqu no tienen en cuenta lo que podra costar con la ayuda del seguro (puede ser ms barato con su seguro), pero  el sitio web puede darle el precio si no utiliz Research scientist (physical sciences).  - Puede imprimir el cupn correspondiente y llevarlo con su receta a la farmacia.  - Tambin puede pasar por nuestra oficina durante el horario de atencin regular y Charity fundraiser una tarjeta de cupones de GoodRx.  - Si necesita que su receta se enve electrnicamente a una farmacia diferente, informe a nuestra oficina a travs de MyChart de [place] o por telfono llamando al [phone] y presione la opcin 4.

## 2022-01-24 ENCOUNTER — Telehealth: Payer: Self-pay | Admitting: Obstetrics

## 2022-01-24 NOTE — Telephone Encounter (Signed)
Left message for patient to call office back to r/s appt. MyChart message sent.

## 2022-01-25 ENCOUNTER — Encounter: Payer: Self-pay | Admitting: Dermatology

## 2022-01-25 ENCOUNTER — Other Ambulatory Visit: Payer: Self-pay | Admitting: Dermatology

## 2022-01-25 DIAGNOSIS — R232 Flushing: Secondary | ICD-10-CM

## 2022-01-25 LAB — 5 HIAA W/CREATININE, 24 HR
5-HIAA, Ur: 2.4 mg/L
5-HIAA,Quant.,24 Hr Urine: 3.1 mg/24 hr (ref 0.0–14.9)
Creatinine, 24H Ur: 1197 mg/24 hr (ref 800–1800)
Creatinine, Urine: 92.1 mg/dL

## 2022-01-26 ENCOUNTER — Telehealth: Payer: Self-pay

## 2022-01-26 DIAGNOSIS — R232 Flushing: Secondary | ICD-10-CM

## 2022-01-26 NOTE — Telephone Encounter (Signed)
-----  Message from [place], MD sent at 01/25/2022  4:38 PM EST ----- Test normal and does not suggest carcinoid syndrome  Recommend blood test (ANA with reflex) and if normal, would do a trial of the pimecrolimus cream for the rash and otherwise monitor.  MAs please call and put lab slip at front desk for her. Thank you!

## 2022-01-26 NOTE — Telephone Encounter (Signed)
Called patient. Discussed lab results. Advised will leave lab requisition for ANA w/reflex at front desk. She will p/u tomorrow. Advised may start a cream pending results.

## 2022-01-27 ENCOUNTER — Ambulatory Visit: Payer: BC Managed Care – PPO | Admitting: Obstetrics

## 2022-01-27 DIAGNOSIS — Z01419 Encounter for gynecological examination (general) (routine) without abnormal findings: Secondary | ICD-10-CM

## 2022-01-27 NOTE — Telephone Encounter (Signed)
Patient is rescheduled for 1/31 with MS

## 2022-02-09 ENCOUNTER — Other Ambulatory Visit (HOSPITAL_COMMUNITY)
Admission: RE | Admit: 2022-02-09 | Discharge: 2022-02-09 | Disposition: A | Discharge status: Home / Self Care | Payer: BC Managed Care – PPO | Source: Ambulatory Visit | Attending: Obstetrics | Admitting: Obstetrics

## 2022-02-09 ENCOUNTER — Encounter: Payer: Self-pay | Admitting: Obstetrics

## 2022-02-09 ENCOUNTER — Ambulatory Visit (INDEPENDENT_AMBULATORY_CARE_PROVIDER_SITE_OTHER): Payer: BC Managed Care – PPO | Admitting: Obstetrics

## 2022-02-09 VITALS — BP 107/77 | HR 88 | Ht 63.0 in | Wt 184.0 lb

## 2022-02-09 DIAGNOSIS — Z01419 Encounter for gynecological examination (general) (routine) without abnormal findings: Secondary | ICD-10-CM | POA: Diagnosis not present

## 2022-02-09 DIAGNOSIS — N9489 Other specified conditions associated with female genital organs and menstrual cycle: Secondary | ICD-10-CM | POA: Diagnosis not present

## 2022-02-09 DIAGNOSIS — Z113 Encounter for screening for infections with a predominantly sexual mode of transmission: Secondary | ICD-10-CM | POA: Diagnosis present

## 2022-02-09 MED ORDER — VALACYCLOVIR HCL 500 MG PO TABS: 500.0000 mg | ORAL_TABLET | Freq: Two times a day (BID) | ORAL | 0 refills | Status: AC

## 2022-02-09 NOTE — Progress Notes (Signed)
SUBJECTIVE  HPI  Angela Rocha is a 27 y.o.-year-old G0P0000 who presents for an annual gynecological exam today. She states that she has had an IUD for 2 years, and over the past 6 months, she has had an increase in bleeding and cramping with menses. She has a h/o heavy, painful periods. She is changing a super tampon about 6 times a day. She has been diagnosed with pelvic congestion syndrome several years ago.  She denies pelvic pain outside of menstruation and ovulation, dyspareunia, and UTI symptoms. She reports decreased libido. She has had difficulty losing weight and would like a referral for weight loss.  Medical/Surgical History Past Medical History:  Diagnosis Date   ADHD    Anxiety    Asthma    B12 deficiency    Femoral neck stress fracture    GERD (gastroesophageal reflux disease)    Hip dysplasia    History of dysplastic nevus 03/04/2020   right upper back, severe/excision   Migraine    Pelvic congestion syndrome    Pyelonephritis    Recurrent upper respiratory infection (URI)    Urticaria    Past Surgical History:  Procedure Laterality Date   ADENOIDECTOMY     COLONOSCOPY WITH PROPOFOL N/A 03/23/2020   Procedure: COLONOSCOPY WITH PROPOFOL;  Surgeon: Lin Landsman, MD;  Location: [facility] ENDOSCOPY;  Service: Gastroenterology;  Laterality: N/A;   HIP SURGERY     HARDWARE REMOVAL X2   PERIACETUBLAR Bilateral    OSTEOTOMY   TONSILLECTOMY     TYMPANOSTOMY TUBE PLACEMENT      Social History Lives with mother. Feels safe there Work: Presenter, broadcasting Exercise: walking  Obstetric History OB History     Gravida  0   Para  0   Term  0   Preterm  0   AB  0   Living  0      SAB  0   IAB  0   Ectopic  0   Multiple  0   Live Births  0            GYN/Menstrual History Patient's last menstrual period was 01/23/2022. Regular monthly periods Last Pap: 12/2019, NILM Contraception: IUD  Prevention Endorses regular dental and eye  exams Mammogram: at 42 Colonoscopy: at 47  Current Medications Outpatient Medications Prior to Visit  Medication Sig   albuterol (VENTOLIN HFA) 108 (90 Base) MCG/ACT inhaler Inhale 2 puffs into the lungs every 6 (six) hours as needed for wheezing or shortness of breath.   budesonide-formoterol (SYMBICORT) 160-4.5 MCG/ACT inhaler Inhale 2 puffs into the lungs 2 (two) times daily.   busPIRone (BUSPAR) 10 MG tablet Take 10 mg by mouth 2 (two) times daily as needed.   cetirizine (ZYRTEC) 10 MG tablet Take 10 mg by mouth daily.   clobetasol (TEMOVATE) 0.05 % external solution Apply to aa's scalp QD PRN. Avoid the face, groin, and underarms.   cloNIDine (CATAPRES) 0.1 MG tablet Take 0.1 mg by mouth 2 (two) times daily.   cyanocobalamin (,VITAMIN B-12,) 1000 MCG/ML injection INJECT 1 ML (1,000 MCG TOTAL) INTO THE MUSCLE EVERY 30 (THIRTY) DAYS.   fluticasone (FLONASE) 50 MCG/ACT nasal spray Place 1 spray into both nostrils 2 (two) times daily as needed for allergies.   gabapentin (NEURONTIN) 300 MG capsule Take by mouth.   Galcanezumab-gnlm (EMGALITY) 120 MG/ML SOAJ Inject into the skin.   hydrocortisone 2.5 % cream Apply to affected areas left eye area twice a day as needed for up to  1 week.   levonorgestrel (MIRENA) 20 MCG/24HR IUD by Intrauterine route.   montelukast (SINGULAIR) 10 MG tablet Take 10 mg by mouth at bedtime.   NEOMYCIN-POLYMYXIN-HYDROCORTISONE (CORTISPORIN) 1 % SOLN OTIC solution Apply 1-2 drops to toe BID after soaking   ondansetron (ZOFRAN ODT) 4 MG disintegrating tablet Take 1 tablet (4 mg total) by mouth every 8 (eight) hours as needed for nausea or vomiting.   pimecrolimus (ELIDEL) 1 % cream Apply 1-2 times daily as needed to rash at face and axilla.   rizatriptan (MAXALT-MLT) 10 MG disintegrating tablet Take 10 mg by mouth as directed. TAKE 1 TAB AT HEADACHE ONSET MAY TAKE A 2ND DOSE AFTER 2 HOURS IF NEEDED NO MORE THAN 2 DOSES/24 HRS   sertraline (ZOLOFT) 100 MG tablet Take  150 mg by mouth daily.   terbinafine (LAMISIL) 250 MG tablet Take 1 tablet (250 mg total) by mouth daily.   tiZANidine (ZANAFLEX) 4 MG tablet 1/2 - 1 po qHS   Trifarotene (AKLIEF) 0.005 % CREA Apply to face nightly as tolerated   valACYclovir (VALTREX) 500 MG tablet TAKE 1 TABLET BY MOUTH TWICE A DAY   lisdexamfetamine (VYVANSE) 50 MG capsule Take 50 mg by mouth daily. (Patient not taking: Reported on 02/09/2022)   No facility-administered medications prior to visit.      [facility] - 02/09/22 0845       Pregnancy Intention Screening   Does the patient want to become pregnant in the next year? No    Does the patient's partner want to become pregnant in the next year? No    Would the patient like to discuss contraceptive options today? No      Contraception Wrap Up   Current Method IUD or IUS    Contraception Counseling Provided No    How was the end contraceptive method provided? N/A            The pregnancy intention screening data noted above was reviewed. Potential methods of contraception were discussed. The patient elected to proceed with IUD.   ROS Constitutional: Denied constitutional symptoms, recent illness, fever, insomnia and weight loss. +night sweats, fatigue  Eyes: Denied eye symptoms, eye pain, photophobia, vision change and visual disturbance.  Ears/Nose/Throat/Neck: Denied ear, nose, throat or neck symptoms, hearing loss, nasal discharge, sinus congestion and sore throat.  Cardiovascular: Denied cardiovascular symptoms, arrhythmia, chest pain/pressure, edema, exercise intolerance, orthopnea and palpitations.  Respiratory: Denied pulmonary symptoms, asthma, pleuritic pain, productive sputum, cough, dyspnea and wheezing.  Gastrointestinal: Denied, gastro-esophageal reflux, melena, nausea and vomiting.  Genitourinary: Denied genitourinary symptoms including symptomatic vaginal discharge, pelvic relaxation issues, and urinary complaints. +cramping and heavy bleeding   Musculoskeletal: Denied musculoskeletal symptoms, stiffness, swelling, muscle weakness and myalgia.  Dermatologic: Denied dermatology symptoms, rash and scar.  Neurologic: Denied neurology symptoms, dizziness, headache, neck pain and syncope.  Psychiatric: Denied psychiatric symptoms, anxiety and depression.  Endocrine: + hot flashes and night sweats.    OBJECTIVE  BP 107/77   Pulse 88   Ht '5\' 3"'$  (1.6 m)   Wt 184 lb (83.5 kg)   LMP 01/23/2022   BMI 32.59 kg/m    Physical examination General NAD, Conversant  HEENT Atraumatic; Op clear with mmm.  Normo-cephalic. Pupils reactive. Anicteric sclerae  Thyroid/Neck Smooth without nodularity or enlargement. Normal ROM.  Neck Supple.  Skin No rashes, lesions or ulceration. Normal palpated skin turgor. No nodularity.  Breasts: No masses or discharge.  Symmetric.  No axillary adenopathy.  Lungs: Clear to auscultation.No rales or  wheezes. Normal Respiratory effort, no retractions.  Heart: NSR.  No murmurs or rubs appreciated. No peripheral edema  Abdomen: Soft.  Non-tender.  No masses.  No HSM. No hernia  Extremities: Moves all appropriately.  Normal ROM for age. No lymphadenopathy.  Neuro: Oriented to PPT.  Normal mood. Normal affect.     Pelvic: Declines exam   ASSESSMENT  1) Annual exam 2) Dysmenorrhea, IUD in place 3) Decreased libido 4) Desires weight loss referral  PLAN 1) Physical exam as noted. Discussed healthy lifestyle choices and preventive care. Declines STI testing. Routine labs as well as labs ordered by her dermatologist drawn today. Pap due in December.  2) Discussed options including 63-monthcourse of OCPs and pelvic [place] determine proper location. Reviewed surgical options given long h/o dysmenorrhea and PCS. Pelvic [place]. 3) Discussed potential causes of decreased libido. Encouraged open communication with partner. Regular use of prescribed Buspar may help mitigate decreased libido caused by SSRIs. 4) Schedule  appt with APhilip Aspen CNM for weight loss.  Return in one year for annual exam or as needed for concerns.   MLloyd Huger CNM

## 2022-02-10 ENCOUNTER — Encounter: Payer: Self-pay | Admitting: Obstetrics

## 2022-02-10 ENCOUNTER — Other Ambulatory Visit: Payer: Self-pay | Admitting: Obstetrics

## 2022-02-10 LAB — CERVICOVAGINAL ANCILLARY ONLY
Bacterial Vaginitis (gardnerella): NEGATIVE
Candida Glabrata: NEGATIVE
Candida Vaginitis: POSITIVE — AB
Chlamydia: NEGATIVE
Comment: NEGATIVE
Comment: NEGATIVE
Comment: NEGATIVE
Comment: NEGATIVE
Comment: NEGATIVE
Comment: NORMAL
Neisseria Gonorrhea: NEGATIVE
Trichomonas: NEGATIVE

## 2022-02-10 MED ORDER — FLUCONAZOLE 150 MG PO TABS: 150.0000 mg | ORAL_TABLET | Freq: Once | ORAL | 1 refills | Status: AC

## 2022-02-12 ENCOUNTER — Emergency Department
Admission: EM | Admit: 2022-02-12 | Discharge: 2022-02-12 | Disposition: A | Discharge status: Home / Self Care | Payer: BC Managed Care – PPO | Attending: Emergency Medicine | Admitting: Emergency Medicine

## 2022-02-12 DIAGNOSIS — N390 Urinary tract infection, site not specified: Secondary | ICD-10-CM | POA: Insufficient documentation

## 2022-02-12 DIAGNOSIS — Z79899 Other long term (current) drug therapy: Secondary | ICD-10-CM | POA: Insufficient documentation

## 2022-02-12 DIAGNOSIS — E860 Dehydration: Secondary | ICD-10-CM | POA: Diagnosis not present

## 2022-02-12 DIAGNOSIS — D72829 Elevated white blood cell count, unspecified: Secondary | ICD-10-CM | POA: Diagnosis not present

## 2022-02-12 DIAGNOSIS — R112 Nausea with vomiting, unspecified: Secondary | ICD-10-CM | POA: Diagnosis present

## 2022-02-12 DIAGNOSIS — J45909 Unspecified asthma, uncomplicated: Secondary | ICD-10-CM | POA: Insufficient documentation

## 2022-02-12 DIAGNOSIS — R197 Diarrhea, unspecified: Secondary | ICD-10-CM | POA: Diagnosis not present

## 2022-02-12 LAB — COMPREHENSIVE METABOLIC PANEL
ALT: 15 IU/L (ref 0–32)
ALT: 14 U/L (ref 0–44)
AST: 12 IU/L (ref 0–40)
AST: 19 U/L (ref 15–41)
Albumin/Globulin Ratio: 2 (ref 1.2–2.2)
Albumin: 4.7 g/dL (ref 4.0–5.0)
Albumin: 4.7 g/dL (ref 3.5–5.0)
Alkaline Phosphatase: 112 IU/L (ref 44–121)
Alkaline Phosphatase: 89 U/L (ref 38–126)
Anion gap: 10 (ref 5–15)
BUN/Creatinine Ratio: 12 (ref 9–23)
BUN: 11 mg/dL (ref 6–20)
BUN: 12 mg/dL (ref 6–20)
Bilirubin Total: 0.3 mg/dL (ref 0.0–1.2)
CO2: 23 mmol/L (ref 20–29)
CO2: 24 mmol/L (ref 22–32)
Calcium: 9.5 mg/dL (ref 8.7–10.2)
Calcium: 9.1 mg/dL (ref 8.9–10.3)
Chloride: 100 mmol/L (ref 96–106)
Chloride: 104 mmol/L (ref 98–111)
Creatinine, Ser: 0.91 mg/dL (ref 0.57–1.00)
Creatinine, Ser: 0.88 mg/dL (ref 0.44–1.00)
GFR, Estimated: >60 mL/min (ref >60)
Globulin, Total: 2.3 g/dL (ref 1.5–4.5)
Glucose, Bld: 84 mg/dL (ref 70–99)
Glucose: 119 mg/dL — ABNORMAL HIGH (ref 70–99)
Potassium: 4.9 mmol/L (ref 3.5–5.2)
Potassium: 3.8 mmol/L (ref 3.5–5.1)
Sodium: 137 mmol/L (ref 134–144)
Sodium: 138 mmol/L (ref 135–145)
Total Bilirubin: 0.6 mg/dL (ref 0.3–1.2)
Total Protein: 7 g/dL (ref 6.0–8.5)
Total Protein: 7.7 g/dL (ref 6.5–8.1)
eGFR: 90 mL/min/{1.73_m2} (ref >59)

## 2022-02-12 LAB — URINALYSIS, ROUTINE W REFLEX MICROSCOPIC
Bilirubin Urine: NEGATIVE
Glucose, UA: NEGATIVE mg/dL
Ketones, ur: NEGATIVE mg/dL
Nitrite: POSITIVE — AB
Protein, ur: NEGATIVE mg/dL
Specific Gravity, Urine: 1.008 (ref 1.005–1.030)
pH: 6 (ref 5.0–8.0)

## 2022-02-12 LAB — LIPID PANEL
Chol/HDL Ratio: 4.9 ratio — ABNORMAL HIGH (ref 0.0–4.4)
Cholesterol, Total: 229 mg/dL — ABNORMAL HIGH (ref 100–199)
HDL: 47 mg/dL (ref >39)
LDL Chol Calc (NIH): 159 mg/dL — ABNORMAL HIGH (ref 0–99)
Triglycerides: 127 mg/dL (ref 0–149)
VLDL Cholesterol Cal: 23 mg/dL (ref 5–40)

## 2022-02-12 LAB — ENA+DNA/DS+ANTICH+CENTRO+FA...
Anti JO-1: <0.2 AI (ref 0.0–0.9)
Antiribosomal P Antibodies: <0.2 AI (ref 0.0–0.9)
Centromere Ab Screen: <0.2 AI (ref 0.0–0.9)
Chromatin Ab SerPl-aCnc: <0.2 AI (ref 0.0–0.9)
ENA RNP Ab: <0.2 AI (ref 0.0–0.9)
ENA SM Ab Ser-aCnc: <0.2 AI (ref 0.0–0.9)
ENA SSA (RO) Ab: <0.2 AI (ref 0.0–0.9)
ENA SSB (LA) Ab: <0.2 AI (ref 0.0–0.9)
Scleroderma (Scl-70) (ENA) Antibody, IgG: <0.2 AI (ref 0.0–0.9)
Smith/RNP Antibodies: <0.2 AI (ref 0.0–0.9)
Speckled Pattern: 1:320 {titer} — ABNORMAL HIGH
dsDNA Ab: <1 IU/mL (ref 0–9)

## 2022-02-12 LAB — GASTROINTESTINAL PANEL BY PCR, STOOL (REPLACES STOOL CULTURE)

## 2022-02-12 LAB — CBC
HCT: 39.4 % (ref 36.0–46.0)
Hematocrit: 38.6 % (ref 34.0–46.6)
Hemoglobin: 13.2 g/dL (ref 11.1–15.9)
Hemoglobin: 12.8 g/dL (ref 12.0–15.0)
MCH: 30.8 pg (ref 26.6–33.0)
MCH: 29.6 pg (ref 26.0–34.0)
MCHC: 34.2 g/dL (ref 31.5–35.7)
MCHC: 32.5 g/dL (ref 30.0–36.0)
MCV: 90 fL (ref 79–97)
MCV: 91 fL (ref 80.0–100.0)
Platelets: 260 10*3/uL (ref 150–450)
Platelets: 261 10*3/uL (ref 150–400)
RBC: 4.28 x10E6/uL (ref 3.77–5.28)
RBC: 4.33 MIL/uL (ref 3.87–5.11)
RDW: 13.1 % (ref 11.7–15.4)
RDW: 13.4 % (ref 11.5–15.5)
WBC: 5.6 10*3/uL (ref 3.4–10.8)
WBC: 7.1 10*3/uL (ref 4.0–10.5)
nRBC: 0 % (ref 0.0–0.2)

## 2022-02-12 LAB — POC URINE PREG, ED: Preg Test, Ur: NEGATIVE

## 2022-02-12 LAB — HEMOGLOBIN A1C
Est. average glucose Bld gHb Est-mCnc: 117 mg/dL
Hgb A1c MFr Bld: 5.7 % — ABNORMAL HIGH (ref 4.8–5.6)

## 2022-02-12 LAB — C DIFFICILE QUICK SCREEN W PCR REFLEX
C Diff antigen: NEGATIVE
C Diff interpretation: NOT DETECTED
C Diff toxin: NEGATIVE

## 2022-02-12 LAB — TSH: TSH: 2.1 u[IU]/mL (ref 0.450–4.500)

## 2022-02-12 LAB — ANA W/REFLEX: ANA Titer 1: POSITIVE — AB

## 2022-02-12 LAB — LIPASE, BLOOD: Lipase: 28 U/L (ref 11–51)

## 2022-02-12 MED ORDER — LOPERAMIDE HCL 2 MG PO TABS: 2.0000 mg | ORAL_TABLET | Freq: Four times a day (QID) | ORAL | 0 refills | Status: DC | PRN

## 2022-02-12 MED ORDER — SODIUM CHLORIDE 0.9 % IV SOLN
12.5000 mg | Freq: Once | INTRAVENOUS | Status: AC
  Administered 2022-02-12: 12.5 mg via INTRAVENOUS
  Filled 2022-02-12: qty 0.5

## 2022-02-12 MED ORDER — DIPHENHYDRAMINE HCL 50 MG/ML IJ SOLN
25.0000 mg | Freq: Once | INTRAMUSCULAR | Status: AC
  Administered 2022-02-12: 25 mg via INTRAVENOUS
  Filled 2022-02-12: qty 1

## 2022-02-12 MED ORDER — ONDANSETRON 4 MG PO TBDP: 4.0000 mg | ORAL_TABLET | Freq: Three times a day (TID) | ORAL | 0 refills | Status: DC | PRN

## 2022-02-12 MED ORDER — KETOROLAC TROMETHAMINE 15 MG/ML IJ SOLN
15.0000 mg | Freq: Once | INTRAMUSCULAR | Status: AC
  Administered 2022-02-12: 15 mg via INTRAVENOUS
  Filled 2022-02-12: qty 1

## 2022-02-12 MED ORDER — HYDROCODONE-ACETAMINOPHEN 5-325 MG PO TABS: 1.0000 | ORAL_TABLET | ORAL | 0 refills | Status: AC | PRN

## 2022-02-12 MED ORDER — LACTATED RINGERS IV BOLUS
1000.0000 mL | Freq: Once | INTRAVENOUS | Status: AC
  Administered 2022-02-12: 1000 mL via INTRAVENOUS

## 2022-02-12 MED ORDER — HYDROCODONE-ACETAMINOPHEN 5-325 MG PO TABS
1.0000 | ORAL_TABLET | Freq: Once | ORAL | Status: AC
  Administered 2022-02-12: 1 via ORAL
  Filled 2022-02-12: qty 1

## 2022-02-12 MED ORDER — ONDANSETRON HCL 4 MG/2ML IJ SOLN
4.0000 mg | Freq: Once | INTRAMUSCULAR | Status: AC
  Administered 2022-02-12: 4 mg via INTRAVENOUS
  Filled 2022-02-12: qty 2

## 2022-02-12 MED ORDER — CEPHALEXIN 500 MG PO CAPS: 500.0000 mg | ORAL_CAPSULE | Freq: Two times a day (BID) | ORAL | 0 refills | Status: AC

## 2022-02-12 NOTE — ED Provider Notes (Signed)
[facility] Provider Note    Event Date/Time   First MD Initiated Contact with Patient 02/12/22 1757     (approximate)   History   Chief Complaint Emesis   HPI Angela Rocha is a 27 y.o. female, history of asthma, anxiety, ADHD, GERD, migraine, presents to the emergency department for evaluation of vomiting/diarrhea.  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  Over the past few days, she has had 3 episodes of foul-smelling diarrhea, as well as intermittent nausea/vomiting.  She states that her diarrhea occurred sporadically and outside of her control.  Reports some abdominal discomfort.  On a few occasions, she was unable to reach the bathroom.  Denies fever/chills, chest pain, shortness of breath, headache, urinary symptoms, dizziness/lightheadedness, weakness, flank pain, or rash/lesions.  History Limitations: No limitations.        Physical Exam  Triage Vital Signs: ED Triage Vitals  Enc Vitals Group     BP 02/12/22 1744 118/77     Pulse Rate 02/12/22 1744 89     Resp 02/12/22 1744 18     Temp 02/12/22 1744 97.9 F (36.6 C)     Temp Source 02/12/22 1744 Oral     SpO2 02/12/22 1744 98 %     Weight 02/12/22 1742 184 lb (83.5 kg)     Height 02/12/22 1742 '5\' 3"'$  (1.6 m)     Head Circumference --      Peak Flow --      Pain Score 02/12/22 1742 8     Pain Loc --      Pain Edu? --      Excl. in [place]? --     Most recent vital signs: Vitals:   02/12/22 1744 02/12/22 2052  BP: 118/77 121/63  Pulse: 89 69  Resp: 18 18  Temp: 97.9 F (36.6 C) 98.1 F (36.7 C)  SpO2: 98% 100%    General: Awake, NAD.  Skin: Warm, dry. No rashes or lesions.  Eyes: PERRL. Conjunctivae normal.  CV: Good peripheral perfusion.  Resp: Normal effort.  Abd: Soft, non-tender. No distention.  Neuro: At baseline. No gross neurological  deficits.  Musculoskeletal: Normal ROM of all extremities.   Physical Exam    ED Results / Procedures / Treatments  Labs (all labs ordered are listed, but only abnormal results are displayed) Labs Reviewed  URINALYSIS, ROUTINE W REFLEX MICROSCOPIC - Abnormal; Notable for the following components:      Result Value   Color, Urine YELLOW (*)    APPearance HAZY (*)    Hgb urine dipstick SMALL (*)    Nitrite POSITIVE (*)    Leukocytes,Ua TRACE (*)    Bacteria, UA MANY (*)    All other components within normal limits  C DIFFICILE QUICK SCREEN W PCR REFLEX    GASTROINTESTINAL PANEL BY PCR, STOOL (REPLACES STOOL CULTURE)  LIPASE, BLOOD  COMPREHENSIVE METABOLIC PANEL  CBC  POC URINE PREG, ED     EKG N/A.    RADIOLOGY  ED Provider Interpretation: N/A.  No results found.  PROCEDURES:  Critical Care performed: N/A.  Procedures    MEDICATIONS ORDERED IN ED: Medications  lactated ringers bolus 1,000 mL (0 mLs Intravenous Stopped 02/12/22 2210)  ondansetron (ZOFRAN) injection 4 mg (4 mg Intravenous Given 02/12/22 1821)  promethazine (PHENERGAN) 12.5 mg in sodium chloride  0.9 % 50 mL IVPB (0 mg Intravenous Stopped 02/12/22 2240)  ketorolac (TORADOL) 15 MG/ML injection 15 mg (15 mg Intravenous Given 02/12/22 2205)  diphenhydrAMINE (BENADRYL) injection 25 mg (25 mg Intravenous Given 02/12/22 2230)  HYDROcodone-acetaminophen (NORCO/VICODIN) 5-325 MG per tablet 1 tablet (1 tablet Oral Given 02/12/22 2306)     IMPRESSION / MDM / ASSESSMENT AND PLAN / ED COURSE  I reviewed the triage vital signs and the nursing notes.                              Differential diagnosis includes, but is not limited to, viral gastroenteritis, infectious diarrhea, C. difficile infection, cystitis, pancreatitis, Crohn's disease, ulcerative colitis, IBS.  ED Course Patient appears well, vitals within normal limits.  NAD.  Will treat her nausea and dehydration with IV fluids and ondansetron.  CBC shows  no leukocytosis or anemia.  CMP shows no electrolyte abnormalities, AKI, or transaminitis.  Urinalysis shows positive nitrates, trace leukocytes, and a bacteria, highly suggestive of urinary tract infection.  Urine culture sent.  Urine pregnancy test negative.  Lipase unremarkable at 28.  Assessment/Plan Patient presents with nausea, vomiting, diarrhea.  On exam, she appears well.  No abdominal tenderness to suggest any acute abdominal pathology.  Lab workup is reassuring, though her urinalysis does show positive nitrates, trace leukocytes, many bacteria.  This may be contributing to her symptoms, though she does state that she gets frequent urinary tract infections.  Her C. difficile PCR is negative.  Gastrointestinal panel PCR negative as well.  Suspect likely viral gastroenteritis versus IBS.  Will provide her with medications to help manage her symptoms.  Additionally provide her with cephalexin to treat her urinary tract infection.  Recommend that she follow with her primary care provider if her symptoms fail to improve despite these treatments.  She expressed understanding and agreed.  Will discharge.  Considered admission for this patient, but given her stable presentation and unremarkable findings, she is unlikely benefit from admission.  Provided the patient with anticipatory guidance, return precautions, and educational material. Encouraged the patient to return to the emergency department at any time if they begin to experience any new or worsening symptoms. Patient expressed understanding and agreed with the plan.   Patient's presentation is most consistent with acute complicated illness / injury requiring diagnostic workup.       FINAL CLINICAL IMPRESSION(S) / ED DIAGNOSES   Final diagnoses:  Diarrhea, unspecified type  Urinary tract infection without hematuria, site unspecified     Rx / DC Orders   ED Discharge Orders          Ordered    HYDROcodone-acetaminophen  (NORCO/VICODIN) 5-325 MG tablet  Every 4 hours PRN        02/12/22 2305    cephALEXin (KEFLEX) 500 MG capsule  2 times daily        02/12/22 2305    loperamide (IMODIUM A-D) 2 MG tablet  4 times daily PRN,   Status:  Discontinued        02/12/22 2305    loperamide (IMODIUM A-D) 2 MG tablet  4 times daily PRN        02/12/22 2305    ondansetron (ZOFRAN-ODT) 4 MG disintegrating tablet  Every 8 hours PRN,   Status:  Discontinued        02/12/22 2307    ondansetron (ZOFRAN-ODT) 4 MG disintegrating tablet  Every 8 hours PRN  02/12/22 2307             Note:  This document was prepared using Dragon voice recognition software and may include unintentional dictation errors.   Teodoro Spray, [place] 02/12/22 2313    Lucillie Garfinkel, MD 02/12/22 [phone]

## 2022-02-12 NOTE — ED Triage Notes (Signed)
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 Pt A&Ox4

## 2022-02-12 NOTE — ED Notes (Signed)
Resting waiting on results of test.

## 2022-02-12 NOTE — ED Notes (Signed)
Pt to ED for diarrhea. had a few episodes of diarrhea this week. Also N/V.  Recently exposed to c diff pt in nursing clinicals. PA saw pt at bedside.

## 2022-02-12 NOTE — Discharge Instructions (Addendum)
-  I suspect you likely have a viral gastroenteritis, otherwise no cyst we will.  Please take the loperamide as needed they will manage your diarrhea.  For the abdominal pain, you may additionally take hydrocodone as needed.  -You are also found to have a urinary tract infection.  Please take the cephalexin as prescribed.-  -If your symptoms fail to improve despite these treatments, please return here or follow-up with your primary care provider.  -Return to the emergency department anytime if you begin to experience any new or worsening symptoms.

## 2022-02-15 ENCOUNTER — Ambulatory Visit: Payer: BC Managed Care – PPO | Admitting: Dermatology

## 2022-02-19 ENCOUNTER — Other Ambulatory Visit: Payer: Self-pay | Admitting: Obstetrics

## 2022-02-19 ENCOUNTER — Encounter: Payer: Self-pay | Admitting: Obstetrics

## 2022-02-19 DIAGNOSIS — D8989 Other specified disorders involving the immune mechanism, not elsewhere classified: Secondary | ICD-10-CM

## 2022-02-24 ENCOUNTER — Ambulatory Visit: Admission: RE | Admit: 2022-02-24 | Payer: BC Managed Care – PPO | Source: Ambulatory Visit

## 2022-02-28 ENCOUNTER — Ambulatory Visit
Admission: RE | Admit: 2022-02-28 | Discharge: 2022-02-28 | Disposition: A | Discharge status: Home / Self Care | Payer: BC Managed Care – PPO | Source: Ambulatory Visit | Attending: Obstetrics | Admitting: Obstetrics

## 2022-02-28 ENCOUNTER — Other Ambulatory Visit: Payer: Self-pay | Admitting: Certified Nurse Midwife

## 2022-02-28 DIAGNOSIS — N9489 Other specified conditions associated with female genital organs and menstrual cycle: Secondary | ICD-10-CM | POA: Diagnosis not present

## 2022-03-02 ENCOUNTER — Encounter: Payer: Self-pay | Admitting: Obstetrics

## 2022-03-09 ENCOUNTER — Encounter: Payer: Self-pay | Admitting: Certified Nurse Midwife

## 2022-03-09 ENCOUNTER — Ambulatory Visit: Payer: BC Managed Care – PPO | Admitting: Certified Nurse Midwife

## 2022-03-09 ENCOUNTER — Telehealth: Payer: Self-pay | Admitting: Certified Nurse Midwife

## 2022-03-09 VITALS — BP 108/77 | HR 93 | Resp 15 | Ht 63.0 in | Wt 178.0 lb

## 2022-03-09 DIAGNOSIS — Z6831 Body mass index (BMI) 31.0-31.9, adult: Secondary | ICD-10-CM

## 2022-03-09 DIAGNOSIS — E669 Obesity, unspecified: Secondary | ICD-10-CM | POA: Diagnosis not present

## 2022-03-09 DIAGNOSIS — Z7689 Persons encountering health services in other specified circumstances: Secondary | ICD-10-CM

## 2022-03-09 DIAGNOSIS — Z713 Dietary counseling and surveillance: Secondary | ICD-10-CM

## 2022-03-09 MED ORDER — PHENTERMINE HCL 37.5 MG PO TABS: 37.5000 mg | ORAL_TABLET | Freq: Every day | ORAL | 0 refills | Status: DC

## 2022-03-09 NOTE — Progress Notes (Signed)
Subjective:  SOFI DIETSCH is a 27 y.o. G0P0000 at Unknown being seen today for weight loss management- initial visit.  Patient reports General ROS: negative and reports previous weight loss attempts: exercise and diet changes with minimal success .   Onset followed:  starting medication,  Associated symptoms include: fatigue, depression, anxiety,  change in clothing fit and menstrual changes. Previous t reatment includes: small frequent feedings,  psychiatrist care, antidepressant, vitamin B-12 injections   Pertinent medical history includes: psychiatric illness, family history  type 2 diabetes.  Risk factors include: anxiety, hr elevated, BP borderline  The patient has a surgical history of: none.  Pertinent social history includes: currently in nursing school  Past evaluation has included: comprehensive metabolic profile, hemoglobin A1c, thyroid panel  and psychiatric evaluation -currently sees psychiatrist.   The following portions of the patient's history were reviewed and updated as appropriate: allergies, current medications, past family history, past medical history, past social history, past surgical history and problem list.   Objective:   Vitals:   03/09/22 0945  BP: 138/89  Pulse: (!) 109  Resp: 15  Weight: 178 lb (80.7 kg)  Height: '5\' 3"'$  (1.6 m)    General:  Alert, oriented and cooperative. Patient is in no acute distress.  PE: Well groomed female in no current distress,   Mental Status: Normal mood and affect. Normal behavior. Normal judgment and thought content.   Current BMI: Body mass index is 31.53 kg/m.   Assessment and Plan:  Obesity  Weight loss management. Discussed use of phentermine potential risks and benefits. Given pt history of anxiety and on arrival today BP 138/89 HR 109. Information sheet given with potential side effects, warning symptoms. She verbalizes and agrees to plan.   Plan: low carb, High protein diet RX for adipex 37.5 mg daily and  currently already takes B12 103mg.ml monthly,  Reviewed side-effects common to medication expected outcomes. Increase daily water intake to at least 8 bottle a day, every day.  Goal is to reduse weight by 5 % by end of three months, and will re-evaluate then.  RTC in 2  weeks for Nurse visit to check weight & BP, and get next B12 injections.    Please refer to After Visit Summary for other counseling recommendations.    TPhilip Aspen CNM         Consider the Low Glycemic Index Diet and 6 smaller meals daily .  This boosts your metabolism and regulates your sugars:   Use the protein bar by Atkins because they have lots of fiber in them  Find the low carb flatbreads, tortillas and pita breads for sandwiches:  Joseph's makes a pita bread and a flat bread , available at [facility] [employer]; T[place]makes a low carb flatbread available at [employer]and HT that is 9 net carbs and 100 cal [place] makes a low carb whole wheat tortilla available at B[employer] Groupmost grocery stores with 6 net carbs and 210 cal  GMayotteyogurt can still have a lot of carbs .  Dannon Light N fit has 80 cal and 8 carbs

## 2022-03-09 NOTE — Telephone Encounter (Signed)
Recall return in 2 week 3/13 for BP/HR check new meds. I contacted patient leaving detailed message on scheduled follow up on 3/13. I advised patient to call back to confirm this scheduled appointment

## 2022-03-09 NOTE — Patient Instructions (Signed)
Calorie Counting for Weight Loss Calories are units of energy. Your body needs a certain number of calories from food to keep going throughout the day. When you eat or drink more calories than your body needs, your body stores the extra calories mostly as fat. When you eat or drink fewer calories than your body needs, your body burns fat to get the energy it needs. Calorie counting means keeping track of how many calories you eat and drink each day. Calorie counting can be helpful if you need to lose weight. If you eat fewer calories than your body needs, you should lose weight. Ask your health care provider what a healthy weight is for you. For calorie counting to work, you will need to eat the right number of calories each day to lose a healthy amount of weight per week. A dietitian can help you figure out how many calories you need in a day and will suggest ways to reach your calorie goal. A healthy amount of weight to lose each week is usually 1-2 lb (0.5-0.9 kg). This usually means that your daily calorie intake should be reduced by 500-750 calories. Eating 1,200-1,500 calories a day can help most women lose weight. Eating 1,500-1,800 calories a day can help most men lose weight. What do I need to know about calorie counting? Work with your health care provider or dietitian to determine how many calories you should get each day. To meet your daily calorie goal, you will need to: Find out how many calories are in each food that you would like to eat. Try to do this before you eat. Decide how much of the food you plan to eat. Keep a food log. Do this by writing down what you ate and how many calories it had. To successfully lose weight, it is important to balance calorie counting with a healthy lifestyle that includes regular activity. Where do I find calorie information?  The number of calories in a food can be found on a Nutrition Facts label. If a food does not have a Nutrition Facts label, try  to look up the calories online or ask your dietitian for help. Remember that calories are listed per serving. If you choose to have more than one serving of a food, you will have to multiply the calories per serving by the number of servings you plan to eat. For example, the label on a package of bread might say that a serving size is 1 slice and that there are 90 calories in a serving. If you eat 1 slice, you will have eaten 90 calories. If you eat 2 slices, you will have eaten 180 calories. How do I keep a food log? After each time that you eat, record the following in your food log as soon as possible: What you ate. Be sure to include toppings, sauces, and other extras on the food. How much you ate. This can be measured in cups, ounces, or number of items. How many calories were in each food and drink. The total number of calories in the food you ate. Keep your food log near you, such as in a pocket-sized notebook or on an app or website on your mobile phone. Some programs will calculate calories for you and show you how many calories you have left to meet your daily goal. What are some portion-control tips? Know how many calories are in a serving. This will help you know how many servings you can have of a certain   food. Use a measuring cup to measure serving sizes. You could also try weighing out portions on a kitchen scale. With time, you will be able to estimate serving sizes for some foods. Take time to put servings of different foods on your favorite plates or in your favorite bowls and cups so you know what a serving looks like. Try not to eat straight from a food's packaging, such as from a bag or box. Eating straight from the package makes it hard to see how much you are eating and can lead to overeating. Put the amount you would like to eat in a cup or on a plate to make sure you are eating the right portion. Use smaller plates, glasses, and bowls for smaller portions and to prevent  overeating. Try not to multitask. For example, avoid watching TV or using your computer while eating. If it is time to eat, sit down at a table and enjoy your food. This will help you recognize when you are full. It will also help you be more mindful of what and how much you are eating. What are tips for following this plan? Reading food labels Check the calorie count compared with the serving size. The serving size may be smaller than what you are used to eating. Check the source of the calories. Try to choose foods that are high in protein, fiber, and vitamins, and low in saturated fat, trans fat, and sodium. Shopping Read nutrition labels while you shop. This will help you make healthy decisions about which foods to buy. Pay attention to nutrition labels for low-fat or fat-free foods. These foods sometimes have the same number of calories or more calories than the full-fat versions. They also often have added sugar, starch, or salt to make up for flavor that was removed with the fat. Make a grocery list of lower-calorie foods and stick to it. Cooking Try to cook your favorite foods in a healthier way. For example, try baking instead of frying. Use low-fat dairy products. Meal planning Use more fruits and vegetables. One-half of your plate should be fruits and vegetables. Include lean proteins, such as chicken, turkey, and fish. Lifestyle Each week, aim to do one of the following: 150 minutes of moderate exercise, such as walking. 75 minutes of vigorous exercise, such as running. General information Know how many calories are in the foods you eat most often. This will help you calculate calorie counts faster. Find a way of tracking calories that works for you. Get creative. Try different apps or programs if writing down calories does not work for you. What foods should I eat?  Eat nutritious foods. It is better to have a nutritious, high-calorie food, such as an avocado, than a food with  few nutrients, such as a bag of potato chips. Use your calories on foods and drinks that will fill you up and will not leave you hungry soon after eating. Examples of foods that fill you up are nuts and nut butters, vegetables, lean proteins, and high-fiber foods such as whole grains. High-fiber foods are foods with more than 5 g of fiber per serving. Pay attention to calories in drinks. Low-calorie drinks include water and unsweetened drinks. The items listed above may not be a complete list of foods and beverages you can eat. Contact a dietitian for more information. What foods should I limit? Limit foods or drinks that are not good sources of vitamins, minerals, or protein or that are high in unhealthy fats. These   include: Candy. Other sweets. Sodas, specialty coffee drinks, alcohol, and juice. The items listed above may not be a complete list of foods and beverages you should avoid. Contact a dietitian for more information. How do I count calories when eating out? Pay attention to portions. Often, portions are much larger when eating out. Try these tips to keep portions smaller: Consider sharing a meal instead of getting your own. If you get your own meal, eat only half of it. Before you start eating, ask for a container and put half of your meal into it. When available, consider ordering smaller portions from the menu instead of full portions. Pay attention to your food and drink choices. Knowing the way food is cooked and what is included with the meal can help you eat fewer calories. If calories are listed on the menu, choose the lower-calorie options. Choose dishes that include vegetables, fruits, whole grains, low-fat dairy products, and lean proteins. Choose items that are boiled, broiled, grilled, or steamed. Avoid items that are buttered, battered, fried, or served with cream sauce. Items labeled as crispy are usually fried, unless stated otherwise. Choose water, low-fat milk,  unsweetened iced tea, or other drinks without added sugar. If you want an alcoholic beverage, choose a lower-calorie option, such as a glass of wine or light beer. Ask for dressings, sauces, and syrups on the side. These are usually high in calories, so you should limit the amount you eat. If you want a salad, choose a garden salad and ask for grilled meats. Avoid extra toppings such as bacon, cheese, or fried items. Ask for the dressing on the side, or ask for olive oil and vinegar or lemon to use as dressing. Estimate how many servings of a food you are given. Knowing serving sizes will help you be aware of how much food you are eating at restaurants. Where to find more information Centers for Disease Control and Prevention: www.cdc.gov U.S. Department of Agriculture: myplate.gov Summary Calorie counting means keeping track of how many calories you eat and drink each day. If you eat fewer calories than your body needs, you should lose weight. A healthy amount of weight to lose per week is usually 1-2 lb (0.5-0.9 kg). This usually means reducing your daily calorie intake by 500-750 calories. The number of calories in a food can be found on a Nutrition Facts label. If a food does not have a Nutrition Facts label, try to look up the calories online or ask your dietitian for help. Use smaller plates, glasses, and bowls for smaller portions and to prevent overeating. Use your calories on foods and drinks that will fill you up and not leave you hungry shortly after a meal. This information is not intended to replace advice given to you by your health care provider. Make sure you discuss any questions you have with your health care provider. Document Revised: 02/07/2019 Document Reviewed: 02/07/2019 Elsevier Patient Education  2023 Elsevier Inc.  

## 2022-03-10 ENCOUNTER — Telehealth: Payer: Self-pay

## 2022-03-10 ENCOUNTER — Telehealth: Payer: Self-pay | Admitting: Certified Nurse Midwife

## 2022-03-10 NOTE — Telephone Encounter (Signed)
This pt called in about the BP/HR visit that is scheduled with you on 3/13.  She is wanting to know if this visit can be a nurse visit, instead of a provider visit.  She is concerned about paying the $80.00 copay for repeated visits.  Can this appt be switched to a nurse visit?

## 2022-03-10 NOTE — Telephone Encounter (Signed)
Pt called triage needing to know if she was supposed to take 1/2 of the Phentermine pill daily or the whole pill? She states that Deneise Lever told her yesterday to take 1/2 but the rx says take 1 pill daily. She states she took 1/2 the pill at 7am and her heart rate is fine.

## 2022-03-23 ENCOUNTER — Ambulatory Visit: Payer: BC Managed Care – PPO | Admitting: Certified Nurse Midwife

## 2022-03-23 ENCOUNTER — Ambulatory Visit (INDEPENDENT_AMBULATORY_CARE_PROVIDER_SITE_OTHER): Payer: BC Managed Care – PPO

## 2022-03-23 ENCOUNTER — Ambulatory Visit: Payer: BC Managed Care – PPO | Admitting: Podiatry

## 2022-03-23 ENCOUNTER — Other Ambulatory Visit: Payer: Self-pay | Admitting: Certified Nurse Midwife

## 2022-03-23 VITALS — BP 100/70 | HR 89 | Ht 63.0 in | Wt 180.0 lb

## 2022-03-23 DIAGNOSIS — Z0130 Encounter for examination of blood pressure without abnormal findings: Secondary | ICD-10-CM | POA: Diagnosis not present

## 2022-03-23 MED ORDER — PHENTERMINE HCL 37.5 MG PO TABS: 37.5000 mg | ORAL_TABLET | Freq: Every day | ORAL | 0 refills | Status: DC

## 2022-03-23 NOTE — Progress Notes (Addendum)
    NURSE VISIT NOTE  Subjective:    Patient ID: Angela Rocha, female    DOB: February 01, 1995, 27 y.o.   MRN: [id number]  HPI  Patient is a 27 y.o. G0P0000 female who presents for BP check per order from Philip Aspen, CNM.   Patient recently started weight loss medication, Phentermine. At last visit, patient had BP reading of 138/89 and HR 109. Due to that reading, she was to take 1/2 tablet (18.75 mg) to see what her blood pressure/heart rate will do. Patient has zero complaints, hasn't noticed any jittering, she feels good.  BP Readings from Last 3 Encounters:  03/23/22 100/70  03/09/22 108/77  02/12/22 121/63   Pulse Readings from Last 3 Encounters:  03/23/22 89  03/09/22 93  02/12/22 69    Objective:    BP 100/70   Pulse 89   Ht 5\' 3"  (1.6 m)   Wt 180 lb (81.6 kg)   LMP 02/23/2022 (Approximate)   BMI 31.89 kg/m   Assessment:   1. Blood pressure check      Plan:   Per Philip Aspen, CNM:   Once note is reviewed, she will send appropriate prescription to pharmacy.  Patient verbalized understanding of instructions.   Drenda Freeze, CMA

## 2022-03-23 NOTE — Patient Instructions (Signed)
Hypertension, Adult High blood pressure (hypertension) is when the force of blood pumping through the arteries is too strong. The arteries are the blood vessels that carry blood from the heart throughout the body. Hypertension forces the heart to work harder to pump blood and may cause arteries to become narrow or stiff. Untreated or uncontrolled hypertension can lead to a heart attack, heart failure, a stroke, kidney disease, and other problems. A blood pressure reading consists of a higher number over a lower number. Ideally, your blood pressure should be below 120/80. The first ("top") number is called the systolic pressure. It is a measure of the pressure in your arteries as your heart beats. The second ("bottom") number is called the diastolic pressure. It is a measure of the pressure in your arteries as the heart relaxes. What are the causes? The exact cause of this condition is not known. There are some conditions that result in high blood pressure. What increases the risk? Certain factors may make you more likely to develop high blood pressure. Some of these risk factors are under your control, including: Smoking. Not getting enough exercise or physical activity. Being overweight. Having too much fat, sugar, calories, or salt (sodium) in your diet. Drinking too much alcohol. Other risk factors include: Having a personal history of heart disease, diabetes, high cholesterol, or kidney disease. Stress. Having a family history of high blood pressure and high cholesterol. Having obstructive sleep apnea. Age. The risk increases with age. What are the signs or symptoms? High blood pressure may not cause symptoms. Very high blood pressure (hypertensive crisis) may cause: Headache. Fast or irregular heartbeats (palpitations). Shortness of breath. Nosebleed. Nausea and vomiting. Vision changes. Severe chest pain, dizziness, and seizures. How is this diagnosed? This condition is diagnosed by  measuring your blood pressure while you are seated, with your arm resting on a flat surface, your legs uncrossed, and your feet flat on the floor. The cuff of the blood pressure monitor will be placed directly against the skin of your upper arm at the level of your heart. Blood pressure should be measured at least twice using the same arm. Certain conditions can cause a difference in blood pressure between your right and left arms. If you have a high blood pressure reading during one visit or you have normal blood pressure with other risk factors, you may be asked to: Return on a different day to have your blood pressure checked again. Monitor your blood pressure at home for 1 week or longer. If you are diagnosed with hypertension, you may have other blood or imaging tests to help your health care provider understand your overall risk for other conditions. How is this treated? This condition is treated by making healthy lifestyle changes, such as eating healthy foods, exercising more, and reducing your alcohol intake. You may be referred for counseling on a healthy diet and physical activity. Your health care provider may prescribe medicine if lifestyle changes are not enough to get your blood pressure under control and if: Your systolic blood pressure is above 130. Your diastolic blood pressure is above 80. Your personal target blood pressure may vary depending on your medical conditions, your age, and other factors. Follow these instructions at home: Eating and drinking  Eat a diet that is high in fiber and potassium, and low in sodium, added sugar, and fat. An example of this eating plan is called the DASH diet. DASH stands for Dietary Approaches to Stop Hypertension. To eat this way: Eat   plenty of fresh fruits and vegetables. Try to fill one half of your plate at each meal with fruits and vegetables. Eat whole grains, such as whole-wheat pasta, brown rice, or whole-grain bread. Fill about one  fourth of your plate with whole grains. Eat or drink low-fat dairy products, such as skim milk or low-fat yogurt. Avoid fatty cuts of meat, processed or cured meats, and poultry with skin. Fill about one fourth of your plate with lean proteins, such as fish, chicken without skin, beans, eggs, or tofu. Avoid pre-made and processed foods. These tend to be higher in sodium, added sugar, and fat. Reduce your daily sodium intake. Many people with hypertension should eat less than 1,500 mg of sodium a day. Do not drink alcohol if: Your health care provider tells you not to drink. You are pregnant, may be pregnant, or are planning to become pregnant. If you drink alcohol: Limit how much you have to: 0-1 drink a day for women. 0-2 drinks a day for men. Know how much alcohol is in your drink. In the [place] one drink equals one 12 oz bottle of beer (355 mL), one 5 oz glass of wine (148 mL), or one 1 oz glass of hard liquor (44 mL). Lifestyle  Work with your health care provider to maintain a healthy body weight or to lose weight. Ask what an ideal weight is for you. Get at least 30 minutes of exercise that causes your heart to beat faster (aerobic exercise) most days of the week. Activities may include walking, swimming, or biking. Include exercise to strengthen your muscles (resistance exercise), such as Pilates or lifting weights, as part of your weekly exercise routine. Try to do these types of exercises for 30 minutes at least 3 days a week. Do not use any products that contain nicotine or tobacco. These products include cigarettes, chewing tobacco, and vaping devices, such as e-cigarettes. If you need help quitting, ask your health care provider. Monitor your blood pressure at home as told by your health care provider. Keep all follow-up visits. This is important. Medicines Take over-the-counter and prescription medicines only as told by your health care provider. Follow directions carefully. Blood  pressure medicines must be taken as prescribed. Do not skip doses of blood pressure medicine. Doing this puts you at risk for problems and can make the medicine less effective. Ask your health care provider about side effects or reactions to medicines that you should watch for. Contact a health care provider if you: Think you are having a reaction to a medicine you are taking. Have headaches that keep coming back (recurring). Feel dizzy. Have swelling in your ankles. Have trouble with your vision. Get help right away if you: Develop a severe headache or confusion. Have unusual weakness or numbness. Feel faint. Have severe pain in your chest or abdomen. Vomit repeatedly. Have trouble breathing. These symptoms may be an emergency. Get help right away. Call 911. Do not wait to see if the symptoms will go away. Do not drive yourself to the hospital. Summary Hypertension is when the force of blood pumping through your arteries is too strong. If this condition is not controlled, it may put you at risk for serious complications. Your personal target blood pressure may vary depending on your medical conditions, your age, and other factors. For most people, a normal blood pressure is less than 120/80. Hypertension is treated with lifestyle changes, medicines, or a combination of both. Lifestyle changes include losing weight, eating a healthy,   low-sodium diet, exercising more, and limiting alcohol. This information is not intended to replace advice given to you by your health care provider. Make sure you discuss any questions you have with your health care provider. Document Revised: 11/03/2020 Document Reviewed: 11/03/2020 Elsevier Patient Education  2023 [employer]  

## 2022-04-13 ENCOUNTER — Encounter: Payer: Self-pay | Admitting: Certified Nurse Midwife

## 2022-04-13 ENCOUNTER — Other Ambulatory Visit: Payer: Self-pay | Admitting: Podiatry

## 2022-04-28 ENCOUNTER — Encounter: Payer: Self-pay | Admitting: Podiatry

## 2022-05-02 ENCOUNTER — Ambulatory Visit: Payer: BC Managed Care – PPO | Admitting: Podiatry

## 2022-05-07 ENCOUNTER — Other Ambulatory Visit: Payer: Self-pay | Admitting: Certified Nurse Midwife

## 2022-05-11 ENCOUNTER — Other Ambulatory Visit: Payer: Self-pay | Admitting: Certified Nurse Midwife

## 2022-06-07 ENCOUNTER — Encounter: Payer: BC Managed Care – PPO | Admitting: Internal Medicine

## 2022-06-10 ENCOUNTER — Emergency Department
Admission: EM | Admit: 2022-06-10 | Discharge: 2022-06-11 | Disposition: A | Discharge status: Home / Self Care | Payer: BC Managed Care – PPO | Attending: Student in an Organized Health Care Education/Training Program | Admitting: Student in an Organized Health Care Education/Training Program

## 2022-06-10 ENCOUNTER — Other Ambulatory Visit: Payer: Self-pay

## 2022-06-10 DIAGNOSIS — N3001 Acute cystitis with hematuria: Secondary | ICD-10-CM | POA: Diagnosis not present

## 2022-06-10 DIAGNOSIS — R300 Dysuria: Secondary | ICD-10-CM | POA: Diagnosis present

## 2022-06-10 LAB — WET PREP, GENITAL
Clue Cells Wet Prep HPF POC: NONE SEEN
Sperm: NONE SEEN
Trich, Wet Prep: NONE SEEN
WBC, Wet Prep HPF POC: <10 (ref <10)
Yeast Wet Prep HPF POC: NONE SEEN

## 2022-06-10 LAB — URINALYSIS, COMPLETE (UACMP) WITH MICROSCOPIC
Bilirubin Urine: NEGATIVE
Glucose, UA: NEGATIVE mg/dL
Ketones, ur: 5 mg/dL — AB
Nitrite: NEGATIVE
Protein, ur: 30 mg/dL — AB
Specific Gravity, Urine: 1.028 (ref 1.005–1.030)
pH: 5 (ref 5.0–8.0)

## 2022-06-10 MED ORDER — FLUCONAZOLE 150 MG PO TABS: 150.0000 mg | ORAL_TABLET | Freq: Once | ORAL | 0 refills | Status: AC

## 2022-06-10 MED ORDER — PHENAZOPYRIDINE HCL 100 MG PO TABS: 100.0000 mg | ORAL_TABLET | Freq: Three times a day (TID) | ORAL | 0 refills | Status: DC | PRN

## 2022-06-10 MED ORDER — HYDROCODONE-ACETAMINOPHEN 5-325 MG PO TABS
1.0000 | ORAL_TABLET | ORAL | Status: AC
  Administered 2022-06-10: 1 via ORAL
  Filled 2022-06-10: qty 1

## 2022-06-10 MED ORDER — CEFTRIAXONE SODIUM 1 G IJ SOLR
1.0000 g | Freq: Once | INTRAMUSCULAR | Status: AC
  Administered 2022-06-10: 1 g via INTRAMUSCULAR
  Filled 2022-06-10: qty 10

## 2022-06-10 MED ORDER — LIDOCAINE HCL (PF) 1 % IJ SOLN
2.1000 mL | Freq: Once | INTRAMUSCULAR | Status: AC
  Administered 2022-06-10: 2.1 mL

## 2022-06-10 MED ORDER — CEPHALEXIN 500 MG PO CAPS: 500.0000 mg | ORAL_CAPSULE | Freq: Four times a day (QID) | ORAL | 0 refills | Status: AC

## 2022-06-10 MED ORDER — DIPHENHYDRAMINE HCL 25 MG PO CAPS
25.0000 mg | ORAL_CAPSULE | Freq: Once | ORAL | Status: AC
  Administered 2022-06-10: 25 mg via ORAL
  Filled 2022-06-10: qty 1

## 2022-06-10 MED ORDER — PHENAZOPYRIDINE HCL 200 MG PO TABS
200.0000 mg | ORAL_TABLET | Freq: Once | ORAL | Status: AC
  Administered 2022-06-11: 200 mg via ORAL
  Filled 2022-06-10: qty 1

## 2022-06-10 NOTE — ED Provider Notes (Signed)
[place] EMERGENCY DEPARTMENT AT [facility] Provider Note   CSN: [id number] Arrival date & time: 06/10/22  2038     History  Chief Complaint  Patient presents with   Dysuria    Angela Rocha is a 27 y.o. female.  Presents to the emergency department for evaluation of dysuria.  She describes burning with urination, decreased urinary output with increase in urinary frequency.  She states she was recently treated for UTI 04/25/2022, was placed on Levaquin.  No cultures were obtained.  Patient states she had some resolution of symptoms but symptoms have returned and she also developed a yeast infection following antibiotics and states she has had continued itching and slight vaginal discharge.  Patient has also had recent testing for gonorrhea and chlamydia that was negative.  No concerns for STDs.  Patient denies any fevers chills back pain abdominal pain  HPI     Home Medications Prior to Admission medications   Medication Sig Start Date End Date Taking? Authorizing Provider  cephALEXin (KEFLEX) 500 MG capsule Take 1 capsule (500 mg total) by mouth 4 (four) times daily for 7 days. 06/10/22 06/17/22 Yes Evon Slack, PA-C  fluconazole (DIFLUCAN) 150 MG tablet Take 1 tablet (150 mg total) by mouth once for 1 dose. Take 1 tablet now and 1 tablet after completion of antibiotics 06/11/22 06/11/22 Yes Evon Slack, PA-C  phenazopyridine (PYRIDIUM) 100 MG tablet Take 1 tablet (100 mg total) by mouth 3 (three) times daily as needed for pain. 06/10/22  Yes Evon Slack, PA-C  albuterol (VENTOLIN HFA) 108 (90 Base) MCG/ACT inhaler Inhale 2 puffs into the lungs every 6 (six) hours as needed for wheezing or shortness of breath. 01/08/20   Coral Ceo, NP  amphetamine-dextroamphetamine (ADDERALL XR) 25 MG 24 hr capsule Take by mouth. 03/08/22   [provider]  budesonide-formoterol (SYMBICORT) 160-4.5 MCG/ACT inhaler Inhale 2 puffs into the lungs 2 (two) times daily. 10/29/20    Leslye Peer, MD  cetirizine (ZYRTEC) 10 MG tablet Take 10 mg by mouth daily. 04/17/17   [provider]  clobetasol (TEMOVATE) 0.05 % external solution Apply to aa's scalp QD PRN. Avoid the face, groin, and underarms. 01/18/22   Moye, [place], MD  cloNIDine (CATAPRES) 0.1 MG tablet Take 0.1 mg by mouth 2 (two) times daily. 10/19/21   [provider]  cyanocobalamin (,VITAMIN B-12,) 1000 MCG/ML injection INJECT 1 ML (1,000 MCG TOTAL) INTO THE MUSCLE EVERY 30 (THIRTY) DAYS. 12/11/17   Shambley, Melody N, CNM  fluticasone (FLONASE) 50 MCG/ACT nasal spray Place 1 spray into both nostrils 2 (two) times daily as needed for allergies. 02/12/20   Ellamae Sia, DO  gabapentin (NEURONTIN) 300 MG capsule Take by mouth. 10/18/21   [provider]  Galcanezumab-gnlm (EMGALITY) 120 MG/ML SOAJ Inject into the skin. 01/20/20   [provider]  hydrocortisone 2.5 % cream Apply to affected areas left eye area twice a day as needed for up to 1 week. 05/21/20   Moye, [place], MD  levonorgestrel (MIRENA) 20 MCG/24HR IUD by Intrauterine route. 01/14/20   [provider]  montelukast (SINGULAIR) 10 MG tablet Take 10 mg by mouth at bedtime. 09/21/21   [provider]  ondansetron (ZOFRAN-ODT) 4 MG disintegrating tablet Take 1 tablet (4 mg total) by mouth every 8 (eight) hours as needed for nausea or vomiting. 02/12/22   Varney Daily, PA  phentermine (ADIPEX-P) 37.5 MG tablet Take 1 tablet (37.5 mg total) by  mouth daily before breakfast. 03/23/22   Doreene Burke, CNM  pimecrolimus (ELIDEL) 1 % cream Apply 1-2 times daily as needed to rash at face and axilla. 01/18/22   Moye, [place], MD  QELBREE 100 MG 24 hr capsule Take 100 mg by mouth daily. 03/08/22   [provider]  rizatriptan (MAXALT-MLT) 10 MG disintegrating tablet Take 10 mg by mouth as directed. TAKE 1 TAB AT HEADACHE ONSET MAY TAKE A 2ND DOSE AFTER 2 HOURS IF NEEDED NO MORE THAN 2 DOSES/24 HRS 04/25/17    [provider]  sertraline (ZOLOFT) 100 MG tablet Take 150 mg by mouth daily. 09/07/21   [provider]  terbinafine (LAMISIL) 250 MG tablet Take 1 tablet (250 mg total) by mouth daily. 11/17/21   Hyatt, Max T, DPM  tiZANidine (ZANAFLEX) 4 MG tablet 1/2 - 1 po qHS 11/01/21   [provider]  Trifarotene (AKLIEF) 0.005 % CREA Apply to face nightly as tolerated 01/18/22   Moye, [place], MD  valACYclovir (VALTREX) 500 MG tablet Take 1 tablet (500 mg total) by mouth 2 (two) times daily. 02/09/22   Glenetta Borg, CNM  Vitamin D, Ergocalciferol, (DRISDOL) 1.25 MG (50000 UNIT) CAPS capsule Take by mouth. 01/12/16   [provider]      Allergies    Doxycycline, Amoxicillin-pot clavulanate, and Penicillins    Review of Systems   Review of Systems  Physical Exam Updated Vital Signs BP (!) 123/103 (BP Location: Left Arm)   Pulse 89   Temp 98.4 F (36.9 C) (Oral)   Resp 20   Ht 5\' 3"  (1.6 m)   Wt 74.8 kg   LMP 04/24/2022 (Exact Date)   SpO2 99%   BMI 29.23 kg/m  Physical Exam Constitutional:      Appearance: She is well-developed.  HENT:     Head: Normocephalic and atraumatic.  Eyes:     Conjunctiva/sclera: Conjunctivae normal.  Cardiovascular:     Rate and Rhythm: Normal rate.  Pulmonary:     Effort: Pulmonary effort is normal. No respiratory distress.  Abdominal:     General: There is no distension.     Tenderness: There is no abdominal tenderness. There is no right CVA tenderness, left CVA tenderness or guarding.  Musculoskeletal:        General: Normal range of motion.     Cervical back: Normal range of motion.  Skin:    General: Skin is warm.     Findings: No rash.  Neurological:     Mental Status: She is alert and oriented to person, place, and time.  Psychiatric:        Behavior: Behavior normal.        Thought Content: Thought content normal.     ED Results / Procedures / Treatments   Labs (all labs ordered are listed, but  only abnormal results are displayed) Labs Reviewed  URINALYSIS, COMPLETE (UACMP) WITH MICROSCOPIC - Abnormal; Notable for the following components:      Result Value   Color, Urine YELLOW (*)    APPearance HAZY (*)    Hgb urine dipstick MODERATE (*)    Ketones, ur 5 (*)    Protein, ur 30 (*)    Leukocytes,Ua SMALL (*)    Bacteria, UA RARE (*)    All other components within normal limits  WET PREP, GENITAL  URINE CULTURE    EKG None  Radiology No results found.  Procedures Procedures    Medications Ordered in ED Medications  phenazopyridine (PYRIDIUM) tablet 200 mg (has no administration in time range)  cefTRIAXone (ROCEPHIN) injection 1 g (1 g Intramuscular Given 06/10/22 2326)  HYDROcodone-acetaminophen (NORCO/VICODIN) 5-325 MG per tablet 1 tablet (1 tablet Oral Given 06/10/22 2323)  diphenhydrAMINE (BENADRYL) capsule 25 mg (25 mg Oral Given 06/10/22 2323)  lidocaine (PF) (XYLOCAINE) 1 % injection 2.1 mL (2.1 mLs Other Given 06/10/22 2327)    ED Course/ Medical Decision Making/ A&P                             Medical Decision Making Amount and/or Complexity of Data Reviewed Labs: ordered.  Risk Prescription drug management.   27 year old female with dysuria.  Urinalysis consistent with UTI.  She is afebrile, nontachycardic.  Vital signs stable.  No back pain nausea vomiting.  A urine culture is obtained and pending.  She is started on ceftriaxone 1 g IM x 1 with cephalexin x 1 week p.o. at home.  She is concerned about possible yeast infection wet prep negative today.  No signs of BV.  She is given 2 fluconazole tablets to take, 1 now and then 1 after completion of antibiotics.  She is given Pyridium for bladder pain.  Patient appears well, no distress.  Tolerating p.o. well.  She understands signs symptoms return to the ER for. Final Clinical Impression(s) / ED Diagnoses Final diagnoses:  Acute cystitis with hematuria    Rx / DC Orders ED Discharge Orders           Ordered    fluconazole (DIFLUCAN) 150 MG tablet   Once        06/10/22 2354    cephALEXin (KEFLEX) 500 MG capsule  4 times daily        06/10/22 2306    phenazopyridine (PYRIDIUM) 100 MG tablet  3 times daily PRN        06/10/22 2306              Evon Slack, PA-C 06/10/22 2359    Willy Eddy, MD 06/11/22 2330

## 2022-06-10 NOTE — Discharge Instructions (Signed)
Please take antibiotics as prescribed.  Urine cultures are pending.  Make sure you are drinking lots of fluid.  Take Pyridium as needed for pain.

## 2022-06-10 NOTE — ED Notes (Signed)
Pt ambulated to the restroom with a steady gait to provide urine sample

## 2022-06-10 NOTE — ED Triage Notes (Signed)
Pt c/o burning urination and sore on labia x3 days. Pt was treated for UTI and yeast infection recently. Pt states she's concerned for herpes and wants the burning pain to stop.

## 2022-06-12 LAB — URINE CULTURE: Culture: NO GROWTH

## 2022-06-22 ENCOUNTER — Other Ambulatory Visit: Payer: Self-pay | Admitting: Dermatology

## 2022-06-22 ENCOUNTER — Other Ambulatory Visit: Payer: Self-pay | Admitting: Certified Nurse Midwife

## 2022-06-22 DIAGNOSIS — L304 Erythema intertrigo: Secondary | ICD-10-CM

## 2022-06-22 NOTE — Telephone Encounter (Signed)
Pt needs an appointment. She has not been back into see me since starting medication

## 2022-07-19 ENCOUNTER — Ambulatory Visit
Admission: RE | Admit: 2022-07-19 | Discharge: 2022-07-19 | Disposition: A | Discharge status: Home / Self Care | Payer: Medicaid Other | Source: Ambulatory Visit | Attending: Emergency Medicine | Admitting: Emergency Medicine

## 2022-07-19 VITALS — BP 123/87 | HR 91 | Temp 98.4°F | Resp 18

## 2022-07-19 DIAGNOSIS — Z3202 Encounter for pregnancy test, result negative: Secondary | ICD-10-CM | POA: Diagnosis not present

## 2022-07-19 DIAGNOSIS — B349 Viral infection, unspecified: Secondary | ICD-10-CM | POA: Diagnosis not present

## 2022-07-19 DIAGNOSIS — J45901 Unspecified asthma with (acute) exacerbation: Secondary | ICD-10-CM

## 2022-07-19 DIAGNOSIS — R300 Dysuria: Secondary | ICD-10-CM

## 2022-07-19 LAB — POCT URINE PREGNANCY: Preg Test, Ur: NEGATIVE

## 2022-07-19 LAB — POCT URINALYSIS DIP (MANUAL ENTRY)
Bilirubin, UA: NEGATIVE
Glucose, UA: NEGATIVE mg/dL
Ketones, POC UA: NEGATIVE mg/dL
Leukocytes, UA: NEGATIVE
Nitrite, UA: NEGATIVE
Protein Ur, POC: NEGATIVE mg/dL
Spec Grav, UA: 1.025 (ref 1.010–1.025)
Urobilinogen, UA: 0.2 E.U./dL
pH, UA: 6.5 (ref 5.0–8.0)

## 2022-07-19 MED ORDER — PREDNISONE 10 MG (21) PO TBPK: ORAL_TABLET | Freq: Every day | ORAL | 0 refills | Status: DC

## 2022-07-19 NOTE — Discharge Instructions (Addendum)
Your urine does not show signs of infection.  Urine pregnancy is negative.  Take the prednisone as directed.  Continue to use your albuterol inhaler as directed.    Schedule a follow-up appointment with your primary care provider.

## 2022-07-19 NOTE — ED Provider Notes (Signed)
[facility]    CSN: [id number] Arrival date & time: 07/19/22  1648      History   Chief Complaint Chief Complaint  Patient presents with   Generalized Body Aches    Chest pains, ER visit for UTI about a month ago, antibiotics completed, having symptoms again, SOB/Asthma flare up, skin sore to the touch, mainly lower back to back of knees, chills - Entered by patient    HPI AMIIRA Rocha is a 27 y.o. female.  Patient presents with generalized malaise, body aches, congestion, cough, shortness of breath, low back pain x 3 to 4 days.  She has been using her albuterol inhaler and allergy medication.  She also has taken OTC cold medication.  She denies fever, rash, chest pain, vomiting, diarrhea, or other symptoms.  Patient [facility] on [facility]/10/[facility]22; diagnosed with acute cystitis with hematuria; treated with Rocephin; discharged on cephalexin, fluconazole, phenazopyridine; urine culture had no growth; wet prep negative.  The history is provided by the patient and medical records.    Past Medical History:  Diagnosis Date   ADHD    Anxiety    Asthma    B12 deficiency    Femoral neck stress fracture    GERD (gastroesophageal reflux disease)    Hip dysplasia    History of dysplastic nevus 03/04/2020   right upper back, severe/excision   Migraine    Pelvic congestion syndrome    Pyelonephritis    Recurrent upper respiratory infection (URI)    Urticaria     Patient Active Problem List   Diagnosis Date Noted   Obesity (BMI 30-39.9) 03/09/2022   Family history of colon cancer 08/02/2021   Sprain of left ankle 01/18/2021   Pain from implanted hardware 03/23/2020   History of colon cancer    Asthma, not well controlled 02/12/2020   History of frequent upper respiratory infection 02/12/2020   Adverse reaction to food, subsequent encounter 02/12/2020   Drug reaction 02/12/2020   Heartburn 02/12/2020   History of systemic reaction to hymenoptera sting 02/12/2020    History of urticaria 02/12/2020   Numbness and tingling in left arm 01/16/2020   Other allergic rhinitis 01/08/2020   Migraine without aura and without status migrainosus, not intractable 08/23/2017   Generalized anxiety disorder 05/22/2017   Other acquired deformity of hip 05/22/2017   Pyelonephritis 05/17/2017   Attention deficit hyperactivity disorder (ADHD), 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   Vitamin D deficiency 01/12/2016   B12 deficiency 01/12/2016   Status post arthroscopy of hip 12/25/2015   Left hip pain 03/04/2015   Labral tear of hip, degenerative 02/04/2015   Quadriceps tendinitis 01/06/2015   Femoral neck stress fracture 01/06/2015   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/06/2015   Cephalalgia 03/12/2014    Past Surgical History:  Procedure Laterality Date   ADENOIDECTOMY     COLONOSCOPY WITH PROPOFOL N/A 03/23/2020   Procedure: COLONOSCOPY WITH PROPOFOL;  Surgeon: Toney Reil, MD;  Location: [facility] ENDOSCOPY;  Service: Gastroenterology;  Laterality: N/A;   HIP SURGERY     HARDWARE REMOVAL X2   PERIACETUBLAR Bilateral    OSTEOTOMY   TONSILLECTOMY     TYMPANOSTOMY TUBE PLACEMENT      OB History     Gravida  0   Para  0   Term  0   Preterm  0   AB  0   Living  0      SAB  0   IAB  0   Ectopic  0   Multiple  0   Live Births  0            Home Medications    Prior to Admission medications   Medication Sig Start Date End Date Taking? Authorizing Provider  albuterol (VENTOLIN HFA) 108 (90 Base) MCG/ACT inhaler Inhale 2 puffs into the lungs every 6 (six) hours as needed for wheezing or shortness of breath. 01/08/20   Coral Ceo, NP  amphetamine-dextroamphetamine (ADDERALL XR) 25 MG 24 hr capsule Take by mouth. 03/08/22   [provider]  budesonide-formoterol (SYMBICORT) 160-4.5 MCG/ACT inhaler Inhale 2 puffs into the lungs 2 (two) times daily. 10/29/20   Leslye Peer, MD  cetirizine (ZYRTEC) 10 MG tablet  Take 10 mg by mouth daily. 04/17/17   [provider]  clobetasol (TEMOVATE) 0.05 % external solution Apply to aa's scalp QD PRN. Avoid the face, groin, and underarms. 01/18/22   Moye, [place], MD  cloNIDine (CATAPRES) 0.1 MG tablet Take 0.1 mg by mouth 2 (two) times daily. 10/19/21   [provider]  cyanocobalamin (,VITAMIN B-12,) 1000 MCG/ML injection INJECT 1 ML (1,000 MCG TOTAL) INTO THE MUSCLE EVERY 30 (THIRTY) DAYS. 12/11/17   Shambley, Melody N, CNM  fluticasone (FLONASE) 50 MCG/ACT nasal spray Place 1 spray into both nostrils 2 (two) times daily as needed for allergies. 02/12/20   Ellamae Sia, DO  gabapentin (NEURONTIN) 300 MG capsule Take by mouth. 10/18/21   [provider]  Galcanezumab-gnlm (EMGALITY) 120 MG/ML SOAJ Inject into the skin. 01/20/20   [provider]  hydrocortisone 2.5 % cream Apply to affected areas left eye area twice a day as needed for up to 1 week. 05/21/20   Moye, [place], MD  levonorgestrel (MIRENA) 20 MCG/24HR IUD by Intrauterine route. 01/14/20   [provider]  montelukast (SINGULAIR) 10 MG tablet Take 10 mg by mouth at bedtime. 09/21/21   [provider]  ondansetron (ZOFRAN-ODT) 4 MG disintegrating tablet Take 1 tablet (4 mg total) by mouth every 8 (eight) hours as needed for nausea or vomiting. 02/12/22   Varney Daily, PA  phenazopyridine (PYRIDIUM) 100 MG tablet Take 1 tablet (100 mg total) by mouth 3 (three) times daily as needed for pain. Patient not taking: Reported on 07/19/2022 06/10/22   Evon Slack, PA-C  phentermine (ADIPEX-P) 37.5 MG tablet Take 1 tablet (37.5 mg total) by mouth daily before breakfast. 03/23/22   Doreene Burke, CNM  pimecrolimus (ELIDEL) 1 % cream APPLY 1-2 TIMES DAILY AS NEEDED TO RASH AT [facility] AND [facility]. 06/22/22   Willeen Niece, MD  predniSONE (STERAPRED UNI-PAK 21 TAB) 10 MG (21) TBPK tablet Take by mouth daily. As directed 07/19/22  Yes Mickie Bail, NP  QELBREE 100 MG 24 hr  capsule Take 100 mg by mouth daily. 03/08/22   [provider]  rizatriptan (MAXALT-MLT) 10 MG disintegrating tablet Take 10 mg by mouth as directed. TAKE 1 TAB AT HEADACHE ONSET MAY TAKE A 2ND DOSE AFTER 2 HOURS IF NEEDED NO MORE THAN 2 DOSES/24 HRS 04/25/17   [provider]  sertraline (ZOLOFT) 100 MG tablet Take 150 mg by mouth daily. 09/07/21   [provider]  terbinafine (LAMISIL) 250 MG tablet Take 1 tablet (250 mg total) by mouth daily. 11/17/21   Hyatt, Max T, DPM  tiZANidine (ZANAFLEX) 4 MG tablet 1/2 - 1 po qHS Patient not taking: Reported on 07/19/2022 11/01/21   [provider]  Trifarotene (AKLIEF) 0.005 % CREA Apply to face nightly as tolerated 01/18/22   Moye, [place], MD  valACYclovir (VALTREX) 500 MG tablet Take 1 tablet (500 mg total) by mouth 2 (two) times daily. Patient not taking: Reported on 07/19/2022 02/09/22   Glenetta Borg, CNM  Vitamin D, Ergocalciferol, (DRISDOL) 1.25 MG (50000 UNIT) CAPS capsule Take by mouth. 01/12/16   [provider]    Family History Family History  Problem Relation Age of Onset   Diabetes Mellitus II Father    Colon cancer Father    Lung cancer Paternal Grandfather     Social History Social History   Tobacco Use   Smoking status: Never   Smokeless tobacco: Never  Vaping Use   Vaping Use: Never used  Substance Use Topics   Alcohol use: Yes    Alcohol/week: 0.0 standard drinks of alcohol    Comment: occas   Drug use: No     Allergies   Doxycycline, Amoxicillin-pot clavulanate, and Penicillins   Review of Systems Review of Systems  Constitutional:  Positive for fatigue. Negative for chills and fever.  HENT:  Positive for congestion and sore throat. Negative for ear pain.   Respiratory:  Positive for cough and shortness of breath. Negative for wheezing.   Cardiovascular:  Negative for chest pain and palpitations.  Gastrointestinal:  Negative for abdominal pain, diarrhea, nausea and  vomiting.  Genitourinary:  Positive for dysuria. Negative for flank pain, frequency, hematuria and pelvic pain.  Musculoskeletal:  Positive for back pain. Negative for arthralgias and joint swelling.  Skin:  Negative for color change and rash.  All other systems reviewed and are negative.    Physical Exam Triage Vital Signs ED Triage Vitals  Enc Vitals Group     BP      Pulse      Resp      Temp      Temp src      SpO2      Weight      Height      Head Circumference      Peak Flow      Pain Score      Pain Loc      Pain Edu?      Excl. in GC?    No data found.  Updated Vital Signs BP 123/87   Pulse 91   Temp 98.4 F (36.9 C)   Resp 18   SpO2 98%   Visual Acuity Right Eye Distance:   Left Eye Distance:   Bilateral Distance:    Right Eye Near:   Left Eye Near:    Bilateral Near:     Physical Exam Vitals and nursing note reviewed.  Constitutional:      General: She is not in acute distress.    Appearance: She is well-developed.  HENT:     Right Ear: Tympanic membrane normal.     Left Ear: Tympanic membrane normal.     Nose: Nose normal.     Mouth/Throat:     Mouth: Mucous membranes are moist.     Pharynx: Oropharynx is clear.  Cardiovascular:     Rate and Rhythm: Normal rate and regular rhythm.     Heart sounds: Normal heart sounds.  Pulmonary:     Effort: Pulmonary effort is normal. No respiratory distress.     Breath sounds: Normal breath sounds. No wheezing.  Abdominal:     General: Bowel sounds are normal.     Palpations: Abdomen is  soft.     Tenderness: There is no abdominal tenderness. There is no right CVA tenderness, guarding or rebound.  Musculoskeletal:     Cervical back: Neck supple.  Skin:    General: Skin is warm and dry.  Neurological:     Mental Status: She is alert.  Psychiatric:        Mood and Affect: Mood normal.        Behavior: Behavior normal.      UC Treatments / Results  Labs (all labs ordered are listed, but only  abnormal results are displayed) Labs Reviewed  POCT URINALYSIS DIP (MANUAL ENTRY) - Abnormal; Notable for the following components:      Result Value   Blood, UA trace-intact (*)    All other components within normal limits  POCT URINE PREGNANCY    EKG   Radiology No results found.  Procedures Procedures (including critical care time)  Medications Ordered in UC Medications - No data to display  Initial Impression / Assessment and Plan / UC Course  I have reviewed the triage vital signs and the nursing notes.  Pertinent labs & imaging results that were available during my care of the patient were reviewed by me and considered in my medical decision making (see chart for details).    Asthma exacerbation, viral illness, dysuria, negative pregnancy test.  Urine does not indicate infection at this time.  Patient reports subjective shortness of breath.  Her lungs are clear at this time and O2 sat is 98% on room air.  Vital signs are stable.  Treating with prednisone taper and continued use of her albuterol inhaler.  Education provided on asthma and dysuria.  Instructed patient to schedule a follow-up appointment with her PCP.  Work note provided per patient request.  She agrees to plan of care.  Final Clinical Impressions(s) / UC Diagnoses   Final diagnoses:  Asthma with acute exacerbation, unspecified asthma severity, unspecified whether persistent  Viral illness  Dysuria  Negative pregnancy test     Discharge Instructions      Your urine does not show signs of infection.  Urine pregnancy is negative.  Take the prednisone as directed.  Continue to use your albuterol inhaler as directed.    Schedule a follow-up appointment with your primary care provider.         ED Prescriptions     Medication Sig Dispense Auth. Provider   predniSONE (STERAPRED UNI-PAK 21 TAB) 10 MG (21) TBPK tablet Take by mouth daily. As directed 21 tablet Mickie Bail, NP      PDMP not  reviewed this encounter.   Mickie Bail, NP 07/19/22 [phone]

## 2022-07-19 NOTE — ED Triage Notes (Addendum)
Patient to Urgent Care with multiple complaints. Reports overall not feeling good.   - Reports recurrent UTI symptoms. Recently completed abx. Reports she has had a return of bilateral flank pain but worsened over the last week. Denies any urinary symptoms. - States she has been feeling coughing/ chest congestion. Hx of asthma (using inhalers, has required increased usage of albuterol). Reports she works at [employer] w/ multiple sick residents.  - Chills. Denies any known fevers. States she has had some night sweats. - Right sided lymph node pain. - Using alker-selzter day and night cold and flu.

## 2022-07-22 ENCOUNTER — Other Ambulatory Visit: Payer: Self-pay

## 2022-07-22 ENCOUNTER — Emergency Department (HOSPITAL_COMMUNITY): Payer: BC Managed Care – PPO

## 2022-07-22 ENCOUNTER — Emergency Department (HOSPITAL_COMMUNITY)
Admission: EM | Admit: 2022-07-22 | Discharge: 2022-07-23 | Disposition: A | Discharge status: Home / Self Care | Payer: BC Managed Care – PPO | Attending: Emergency Medicine | Admitting: Emergency Medicine

## 2022-07-22 DIAGNOSIS — M545 Low back pain, unspecified: Secondary | ICD-10-CM | POA: Diagnosis not present

## 2022-07-22 DIAGNOSIS — R109 Unspecified abdominal pain: Secondary | ICD-10-CM | POA: Diagnosis not present

## 2022-07-22 DIAGNOSIS — J45909 Unspecified asthma, uncomplicated: Secondary | ICD-10-CM | POA: Insufficient documentation

## 2022-07-22 DIAGNOSIS — M79661 Pain in right lower leg: Secondary | ICD-10-CM | POA: Insufficient documentation

## 2022-07-22 LAB — CBC
HCT: 38.2 % (ref 36.0–46.0)
Hemoglobin: 12.6 g/dL (ref 12.0–15.0)
MCH: 30.5 pg (ref 26.0–34.0)
MCHC: 33 g/dL (ref 30.0–36.0)
MCV: 92.5 fL (ref 80.0–100.0)
Platelets: 232 10*3/uL (ref 150–400)
RBC: 4.13 MIL/uL (ref 3.87–5.11)
RDW: 13.4 % (ref 11.5–15.5)
WBC: 6.2 10*3/uL (ref 4.0–10.5)
nRBC: 0 % (ref 0.0–0.2)

## 2022-07-22 LAB — URINALYSIS, ROUTINE W REFLEX MICROSCOPIC
Bilirubin Urine: NEGATIVE
Glucose, UA: NEGATIVE mg/dL
Hgb urine dipstick: NEGATIVE
Ketones, ur: NEGATIVE mg/dL
Nitrite: NEGATIVE
Protein, ur: NEGATIVE mg/dL
Specific Gravity, Urine: 1.003 — ABNORMAL LOW (ref 1.005–1.030)
pH: 6 (ref 5.0–8.0)

## 2022-07-22 LAB — LIPASE, BLOOD: Lipase: 29 U/L (ref 11–51)

## 2022-07-22 LAB — COMPREHENSIVE METABOLIC PANEL WITH GFR
ALT: 14 U/L (ref 0–44)
AST: 16 U/L (ref 15–41)
Albumin: 4 g/dL (ref 3.5–5.0)
Alkaline Phosphatase: 69 U/L (ref 38–126)
Anion gap: 10 (ref 5–15)
BUN: 7 mg/dL (ref 6–20)
CO2: 25 mmol/L (ref 22–32)
Calcium: 9 mg/dL (ref 8.9–10.3)
Chloride: 102 mmol/L (ref 98–111)
Creatinine, Ser: 0.79 mg/dL (ref 0.44–1.00)
GFR, Estimated: >60 mL/min
Glucose, Bld: 101 mg/dL — ABNORMAL HIGH (ref 70–99)
Potassium: 3.5 mmol/L (ref 3.5–5.1)
Sodium: 137 mmol/L (ref 135–145)
Total Bilirubin: 0.3 mg/dL (ref 0.3–1.2)
Total Protein: 6.7 g/dL (ref 6.5–8.1)

## 2022-07-22 LAB — D-DIMER, QUANTITATIVE: D-Dimer, Quant: 0.39 ug{FEU}/mL (ref 0.00–0.50)

## 2022-07-22 LAB — HCG, SERUM, QUALITATIVE: Preg, Serum: NEGATIVE

## 2022-07-22 MED ORDER — KETOROLAC TROMETHAMINE 30 MG/ML IJ SOLN: 30.0000 mg | Freq: Once | INTRAMUSCULAR | Status: DC

## 2022-07-22 MED ORDER — ONDANSETRON 4 MG PO TBDP
4.0000 mg | ORAL_TABLET | Freq: Once | ORAL | Status: AC
  Administered 2022-07-22: 4 mg via ORAL
  Filled 2022-07-22: qty 1

## 2022-07-22 MED ORDER — ONDANSETRON HCL 4 MG PO TABS: 4.0000 mg | ORAL_TABLET | Freq: Four times a day (QID) | ORAL | 0 refills | Status: DC

## 2022-07-22 MED ORDER — CEFDINIR 300 MG PO CAPS
300.0000 mg | ORAL_CAPSULE | Freq: Two times a day (BID) | ORAL | Status: DC
  Administered 2022-07-22: 300 mg via ORAL
  Filled 2022-07-22: qty 1

## 2022-07-22 MED ORDER — CEFDINIR 300 MG PO CAPS: 300.0000 mg | ORAL_CAPSULE | Freq: Two times a day (BID) | ORAL | 0 refills | Status: DC

## 2022-07-22 MED ORDER — KETOROLAC TROMETHAMINE 30 MG/ML IJ SOLN
30.0000 mg | Freq: Once | INTRAMUSCULAR | Status: AC
  Administered 2022-07-22: 30 mg via INTRAMUSCULAR
  Filled 2022-07-22: qty 1

## 2022-07-22 NOTE — ED Triage Notes (Signed)
The pt has had a feeling of weakness for a month she thinks   she has a uti  she has the skin on her back  that feels srrange  lmp   2 weeks ago

## 2022-07-22 NOTE — ED Provider Notes (Signed)
[place] EMERGENCY DEPARTMENT AT [facility] Provider Note   CSN: [id number] Arrival date & time: 07/22/22  1439     History {Add pertinent medical, surgical, social history, OB history to HPI:1} Chief Complaint  Patient presents with  . Abdominal Pain    Angela Rocha is a 27 y.o. female history of ADHD, anxiety, asthma, GERD, migraines presents today for multiple complaints.  Patient states she has had pain on both sides of her flank and up in the middle of her back for the last 4 days.  She endorses increased urinary frequency.  States the last time she had similar symptoms she was diagnosed with kidney infection. Patient also have severe pain on her right calf.  She denies any leg swelling.  States all her symptoms started about 5 to 6 days ago when she just got over some cold flu symptoms.  States she had a fever prior to arrival.  Denies any recent travel, surgery, personal or family history of PE/DVT, cough, shortness of breath, chest pain, nausea or vomiting.   Abdominal Pain     Past Medical History:  Diagnosis Date  . ADHD   . Anxiety   . Asthma   . B12 deficiency   . Femoral neck stress fracture   . GERD (gastroesophageal reflux disease)   . Hip dysplasia   . History of dysplastic nevus 03/04/2020   right upper back, severe/excision  . Migraine   . Pelvic congestion syndrome   . Pyelonephritis   . Recurrent upper respiratory infection (URI)   . Urticaria    Past Surgical History:  Procedure Laterality Date  . ADENOIDECTOMY    . COLONOSCOPY WITH PROPOFOL N/A 03/23/2020   Procedure: COLONOSCOPY WITH PROPOFOL;  Surgeon: Toney Reil, MD;  Location: [facility] END[facility]COPY;  Service: Gastroenterology;  Laterality: N/A;  . HIP SURGERY     HARDWARE REMOVAL X2  . PERIACETUBLAR Bilateral    OSTEOTOMY  . TONSILLECTOMY    . TYMPANOSTOMY TUBE PLACEMENT       Home Medications Prior to Admission medications   Medication Sig Start Date End Date Taking?  Authorizing Provider  albuterol (VENTOLIN HFA) 108 (90 Base) MCG/ACT inhaler Inhale 2 puffs into the lungs every 6 (six) hours as needed for wheezing or shortness of breath. 01/08/20   Coral Ceo, NP  amphetamine-dextroamphetamine (ADDERALL XR) 25 MG 24 hr capsule Take by mouth. 03/08/22   [provider]  budesonide-formoterol (SYMBICORT) 160-4.5 MCG/ACT inhaler Inhale 2 puffs into the lungs 2 (two) times daily. 10/29/20   Leslye Peer, MD  cetirizine (ZYRTEC) 10 MG tablet Take 10 mg by mouth daily. 04/17/17   [provider]  clobetasol (TEMOVATE) 0.05 % external solution Apply to aa's scalp QD PRN. Avoid the face, groin, and underarms. 01/18/22   Moye, [place], MD  cloNIDine (CATAPRES) 0.1 MG tablet Take 0.1 mg by mouth 2 (two) times daily. 10/19/21   [provider]  cyanocobalamin (,VITAMIN B-12,) 1000 MCG/ML injection INJECT 1 ML (1,000 MCG TOTAL) INTO THE MUSCLE EVERY 30 (THIRTY) DAYS. 12/11/17   Shambley, Melody N, CNM  fluticasone (FLONASE) 50 MCG/ACT nasal spray Place 1 spray into both nostrils 2 (two) times daily as needed for allergies. 02/12/20   Ellamae Sia, DO  gabapentin (NEURONTIN) 300 MG capsule Take by mouth. 10/18/21   [provider]  Galcanezumab-gnlm (EMGALITY) 120 MG/ML SOAJ Inject into the skin. 01/20/20   [provider]  hydrocortisone 2.5 % cream Apply to affected  areas left eye area twice a day as needed for up to 1 week. 05/21/20   Moye, [place], MD  levonorgestrel (MIRENA) 20 MCG/24HR IUD by Intrauterine route. 01/14/20   [provider]  montelukast (SINGULAIR) 10 MG tablet Take 10 mg by mouth at bedtime. 09/21/21   [provider]  ondansetron (ZOFRAN-ODT) 4 MG disintegrating tablet Take 1 tablet (4 mg total) by mouth every 8 (eight) hours as needed for nausea or vomiting. 02/12/22   Varney Daily, PA  phenazopyridine (PYRIDIUM) 100 MG tablet Take 1 tablet (100 mg total) by mouth 3 (three) times daily as  needed for pain. Patient not taking: Reported on 07/19/2022 06/10/22   Evon Slack, PA-C  phentermine (ADIPEX-P) 37.5 MG tablet Take 1 tablet (37.5 mg total) by mouth daily before breakfast. 03/23/22   Doreene Burke, CNM  pimecrolimus (ELIDEL) 1 % cream APPLY 1-2 TIMES DAILY AS NEEDED TO RASH AT [facility] AND [facility]. 06/22/22   Willeen Niece, MD  predniSONE (STERAPRED UNI-PAK 21 TAB) 10 MG (21) TBPK tablet Take by mouth daily. As directed 07/19/22   Mickie Bail, NP  QELBREE 100 MG 24 hr capsule Take 100 mg by mouth daily. 03/08/22   [provider]  rizatriptan (MAXALT-MLT) 10 MG disintegrating tablet Take 10 mg by mouth as directed. TAKE 1 TAB AT HEADACHE ONSET MAY TAKE A 2ND DOSE AFTER 2 HOURS IF NEEDED NO MORE THAN 2 DOSES/24 HRS 04/25/17   [provider]  sertraline (ZOLOFT) 100 MG tablet Take 150 mg by mouth daily. 09/07/21   [provider]  terbinafine (LAMISIL) 250 MG tablet Take 1 tablet (250 mg total) by mouth daily. 11/17/21   Hyatt, Max T, DPM  tiZANidine (ZANAFLEX) 4 MG tablet 1/2 - 1 po qHS Patient not taking: Reported on 07/19/2022 11/01/21   [provider]  Trifarotene (AKLIEF) 0.005 % CREA Apply to face nightly as tolerated 01/18/22   Moye, [place], MD  valACYclovir (VALTREX) 500 MG tablet Take 1 tablet (500 mg total) by mouth 2 (two) times daily. Patient not taking: Reported on 07/19/2022 02/09/22   Glenetta Borg, CNM  Vitamin D, Ergocalciferol, (DRISDOL) 1.25 MG (50000 UNIT) CAPS capsule Take by mouth. 01/12/16   [provider]      Allergies    Doxycycline, Amoxicillin-pot clavulanate, and Penicillins    Review of Systems   Review of Systems  Gastrointestinal:  Positive for abdominal pain.    Physical Exam Updated Vital Signs BP 121/83   Pulse 86   Temp 97.9 F (36.6 C) (Oral)   Resp 16   Ht 5\' 3"  (1.6 m)   Wt 74.8 kg   SpO2 100%   BMI 29.21 kg/m  Physical Exam Vitals and nursing note reviewed.  Constitutional:       Appearance: Normal appearance.  HENT:     Head: Normocephalic and atraumatic.     Mouth/Throat:     Mouth: Mucous membranes are moist.  Eyes:     General: No scleral icterus. Cardiovascular:     Rate and Rhythm: Normal rate and regular rhythm.     Pulses: Normal pulses.     Heart sounds: Normal heart sounds.  Pulmonary:     Effort: Pulmonary effort is normal.     Breath sounds: Normal breath sounds.  Abdominal:     General: Abdomen is flat.     Palpations: Abdomen is soft.     Tenderness: There is no abdominal tenderness.  Musculoskeletal:  General: No deformity.  Skin:    General: Skin is warm.     Findings: No rash.  Neurological:     General: No focal deficit present.     Mental Status: She is alert.  Psychiatric:        Mood and Affect: Mood normal.     ED Results / Procedures / Treatments   Labs (all labs ordered are listed, but only abnormal results are displayed) Labs Reviewed  COMPREHENSIVE METABOLIC PANEL - Abnormal; Notable for the following components:      Result Value   Glucose, Bld 101 (*)    All other components within normal limits  URINALYSIS, ROUTINE W REFLEX MICROSCOPIC - Abnormal; Notable for the following components:   Color, Urine STRAW (*)    Specific Gravity, Urine 1.003 (*)    Leukocytes,Ua SMALL (*)    Bacteria, UA RARE (*)    All other components within normal limits  LIPASE, BLOOD  CBC  HCG, SERUM, QUALITATIVE    EKG None  Radiology No results found.  Procedures Procedures  {Document cardiac monitor, telemetry assessment procedure when appropriate:1}  Medications Ordered in ED Medications - No data to display  ED Course/ Medical Decision Making/ A&P Clinical Course as of 07/22/22 2211  Fri Jul 22, 2022  2208 Stable BL Flank pain.  Waiting on Renal Study  Endorses UTI symptoms and CVA tenderness.  NL rule out.   [CC]    Clinical Course User Index [CC] Glyn Ade, MD   {   Click here for ABCD2, HEART  and other calculatorsREFRESH Note before signing :1}                          Medical Decision Making Amount and/or Complexity of Data Reviewed Radiology: ordered.   This patient presents to the ED for bilateral flank pain, this involves an extensive number of treatment options, and is a complaint that carries with a high risk of complications and morbidity.  The differential diagnosis includes AAA rupture, DKA perforated viscus, SBO, acute gastroenteritis, kidney stone, appendicitis, diverticulitis.  This is not an exhaustive list.  Lab tests: I ordered and personally interpreted labs.  The pertinent results include: WBC unremarkable. Hbg unremarkable. Platelets unremarkable. Electrolytes unremarkable. BUN, creatinine unremarkable.  UA unremarkable.  D-dimer negative.  Imaging studies: I ordered imaging studies, personally reviewed, interpreted imaging and agree with the radiologist's interpretations. The results include: ET renal stone ordered and pending.  Problem list/ ED course/ Critical interventions/ Medical management: HPI: See above Vital signs within normal range and stable throughout visit. Laboratory/imaging studies significant for: See above. On physical examination, patient is afebrile and appears in no acute distress.  There was tenderness to palpation to bilateral flank with positive CVA tenderness bilaterally.  CBC with no leukocytosis or anemia.  CMP with no acute electrolyte abnormalities or AKI.  UA unremarkable, unlikely UTI.  Patient also complaining of right calf pain for a week.  D-dimer was negative so doubt DVT.  Zofran and Toradol ordered.  Will sign out patient to the attending Dr. Doran Durand with pending CT renal stone. I have reviewed the patient home medicines and have made adjustments as needed.  Cardiac monitoring/EKG: The patient was maintained on a cardiac monitor.  I personally reviewed and interpreted the cardiac monitor which showed an underlying rhythm  of: sinus rhythm.  Additional history obtained: External records from outside source obtained and reviewed including: Chart review including previous notes, labs, imaging.  Consultations obtained:  Disposition Patient's care signed out at shift change to Dr. Doran Durand with pending CT renal stone. This chart was dictated using voice recognition software.  Despite best efforts to proofread,  errors can occur which can change the documentation meaning.    {Document critical care time when appropriate:1} {Document review of labs and clinical decision tools ie heart score, Chads2Vasc2 etc:1}  {Document your independent review of radiology images, and any outside records:1} {Document your discussion with family members, caretakers, and with consultants:1} {Document social determinants of health affecting pt's care:1} {Document your decision making why or why not admission, treatments were needed:1} Final Clinical Impression(s) / ED Diagnoses Final diagnoses:  Flank pain  Acute bilateral low back pain without sciatica    Rx / DC Orders ED Discharge Orders     None

## 2022-07-22 NOTE — ED Provider Notes (Signed)
See same-day attestation. [name] is a 27 year old female presenting with a chief complaint of generalized malaise for a month, urinary frequency, leg pain.  Seen by PA, DVT ruled out with D-dimer and physical exam.  UTI remains on the differential though not particularly diagnostic on this urinalysis.  She is symptomatic with urinary discomfort urinary frequency burning and has a history of pyelonephritis with sepsis from this UTI in the past. Lab work shows a history of E. coli sepsis. Evaluated personally at bedside, she is in no acute distress right now she does have flank pain.  Will treat as pyelonephritis with third-generation cephalosporin and plan for follow-up with gynecologist for evaluation for possible underlying pathology causing her recurrent urinary tract infection.  Alternatively this may be of interstitial cystitis that requires referral to urology or pelvic floor dysfunction.  No acute pathology otherwise on lab exam and patient well-appearing on reassessment stable for outpatient care and management.   Angela Ade, MD 07/22/22 [phone]

## 2022-07-22 NOTE — ED Notes (Signed)
Patient transported to CT 

## 2022-07-24 LAB — URINE CULTURE

## 2022-07-24 NOTE — Progress Notes (Unsigned)
Office Visit Note  Patient: Angela Rocha             Date of Birth: 12/28/1995           MRN: [id number]             PCP: Carren Rang, PA-C Referring: Glenetta Borg, CNM Visit Date: 07/25/2022 Occupation: CNA/Nursing Student  Subjective:  New Patient (Initial Visit) (Patient states she takes gabapentin for tremors. Patient states she does not have morning stiffness but she does have pain in the morning. Patient states her pain is worse when she is staying still. Patient states she does get infections easily. Patient states she used to have a rash that pops up on her face. Patient states she used to get sinus infections easily. Patient states her lymph nodes are swollen. Patient states she gets UTIs often. Patient states she has fatigue. )   History of Present Illness: [name] is a 27 y.o. female here for evaluation of positive ANA checked and associated with multiple symptoms including chronic fatigue, tremors, Raynaud's, facial rashes, and joint pain. She has a medical history including asthma, migraines, psoriasis, ADHD, and multiple left hip surgeries.  Her biggest complaint is primarily persistent daily fatigue with low energy and often needing up to 14 hours of sleep to feel rested.  Problem has been chronic but feels it is worse in the past approximately 2 years.  Also has trouble with migraine headaches and experiences bilateral hand tremor.  Sees neurology for this currently takes gabapentin 400 mg twice daily with significant but incomplete benefit.  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  Has a history of asthma also previously had problems from recurrent sinus infections.  Since giving send to control and with repeat pneumococcal vaccination the URI rate has improved still has some issues with recurrent urinary  tract infections.  Multiple episodes of cystitis and at least once with pyelonephritis a few years ago.  History of joint pain in several areas biggest problem by far has been injury at the left hip with previous labral tear poor outcomes from arthroscopy requiring several surgical revision.  Now doing well does not have much hip pain though still has some lateral pain or bursitis pretty frequently.  Biggest issue now is low back pain and imaging showing degenerative changes at facet joint of bottom 2 vertebrae.  Also gets some pain in the lower legs involving the calf muscle ankle and foot.  This is most severe when lying for long periods of time frequently has to get up and move around to alleviate this.  Does not see much visible joint swelling.  Has noticed some nodules about type of changes at the back of the ankles.  History of psoriasis with rashes at the base of occiput treated with topical steroid solution but has not needed any treatment for a few years. Facial rash with flushing has not identified specific trigger usually last 5 to 10 minutes is hot but not itchy or painful and no residual changes. Reports this occurring sometimes after hot showers, stress, not specifically sun exposure. Also had rashes in axillae treated with topical medication per derm. Feels stiffness and tenderness in the right side of the neck feels there is swelling occasionally but usually does not have pain or nodules present on a daily basis.  Activities  of Daily Living:  Patient reports morning stiffness for 0 minute.   Patient Reports nocturnal pain.  Difficulty dressing/grooming: Denies Difficulty climbing stairs: Denies Difficulty getting out of chair: Denies Difficulty using hands for taps, buttons, cutlery, and/or writing: Denies  Review of Systems  Constitutional:  Positive for fatigue.  HENT:  Positive for mouth dryness. Negative for mouth sores.   Eyes:  Positive for dryness.  Respiratory:  Positive  for shortness of breath.   Cardiovascular:  Negative for chest pain and palpitations.  Gastrointestinal:  Positive for constipation. Negative for blood in stool and diarrhea.  Endocrine: Negative for increased urination.  Genitourinary:  Negative for involuntary urination.  Musculoskeletal:  Positive for joint pain, joint pain, myalgias, muscle weakness, muscle tenderness and myalgias. Negative for gait problem, joint swelling and morning stiffness.  Skin:  Positive for rash and sensitivity to sunlight. Negative for color change and hair loss.  Allergic/Immunologic: Positive for susceptible to infections.  Neurological:  Positive for headaches. Negative for dizziness.  Hematological:  Positive for swollen glands.  Psychiatric/Behavioral:  Positive for depressed mood and sleep disturbance. The patient is nervous/anxious.     PMFS History:  Patient Active Problem List   Diagnosis Date Noted   Positive ANA (antinuclear antibody) 07/25/2022   Facial rash 07/25/2022   Other fatigue 07/25/2022   Frequent infections 07/25/2022   Obesity (BMI 30-39.9) 03/09/2022   Family history of colon cancer 08/02/2021   Sprain of left ankle 01/18/2021   Pain from implanted hardware 03/23/2020   History of colon cancer    Asthma, not well controlled 02/12/2020   History of frequent upper respiratory infection 02/12/2020   Adverse reaction to food, subsequent encounter 02/12/2020   Drug reaction 02/12/2020   Heartburn 02/12/2020   History of systemic reaction to hymenoptera sting 02/12/2020   History of urticaria 02/12/2020   Numbness and tingling in left arm 01/16/2020   Other allergic rhinitis 01/08/2020   Migraine without aura and without status migrainosus, not intractable 08/23/2017   Generalized anxiety disorder 05/22/2017   Other acquired deformity of hip 05/22/2017   Pyelonephritis 05/17/2017   Attention deficit hyperactivity disorder (ADHD), 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   Intrinsic asthma  02/11/2016   Vitamin D deficiency 01/12/2016   B12 deficiency 01/12/2016   Status post arthroscopy of hip 12/25/2015   Left hip pain 03/04/2015   Labral tear of hip, degenerative 02/04/2015   Quadriceps tendinitis 01/06/2015   Femoral neck stress fracture 01/06/2015   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/06/2015   Cephalalgia 03/12/2014    Past Medical History:  Diagnosis Date   ADHD    Anxiety    Asthma    B12 deficiency    Femoral neck stress fracture    GERD (gastroesophageal reflux disease)    Hip dysplasia    History of dysplastic nevus 03/04/2020   right upper back, severe/excision   Migraine    Pelvic congestion syndrome    Pyelonephritis    Recurrent upper respiratory infection (URI)    Urticaria     Family History  Problem Relation Age of Onset   Diabetes Mellitus II Father    Colon cancer Father    Lung cancer Paternal Grandfather    Past Surgical History:  Procedure Laterality Date   ADENOIDECTOMY     COLONOSCOPY WITH PROPOFOL N/A 03/23/2020   Procedure: COLONOSCOPY WITH PROPOFOL;  Surgeon: Toney Reil, MD;  Location: [facility] ENDOSCOPY;  Service: Gastroenterology;  Laterality: N/A;   HIP SURGERY     HARDWARE REMOVAL  X2   PERIACETUBLAR Bilateral    OSTEOTOMY   TONSILLECTOMY     TYMPANOSTOMY TUBE PLACEMENT     Social History   Social History Narrative   Not on file   Immunization History  Administered Date(s) Administered   HIB, Unspecified 12/11/1997, 02/09/1998, 02/03/1999   HPV Quadrivalent 05/24/2010, 05/24/2011, 05/23/2012   Hep A, Unspecified 12/17/2008, 05/24/2010   Hep B, Unspecified 04/10/1998, 07/13/1998   Influenza Inj Mdck Quad Pf 09/28/2021   Influenza,inj,Quad PF,6+ Mos 10/26/2017   Influenza-Unspecified 12/30/2016   MMR 02/03/1999, 08/22/2002   Meningococcal Conjugate 12/17/2008, 08/15/2016   PPD Test 08/25/2021, 09/21/2021, 10/05/2021   Pneumococcal Conjugate-13 10/26/1998, 04/29/1999   Pneumococcal Polysaccharide-23 03/04/2020   Td  (Adult),unspecified 12/17/2008   Tdap 12/31/2019   Varicella 10/26/1998, 10/16/2007     Objective: Vital Signs: BP 116/75 (BP Location: Right Arm, Patient Position: Sitting, Cuff Size: Normal)   Pulse 92   Resp 12   Ht 5' 3.5" (1.613 m)   Wt 177 lb (80.3 kg)   BMI 30.86 kg/m    Physical Exam HENT:     Right Ear: External ear normal.     Left Ear: External ear normal.     Mouth/Throat:     Mouth: Mucous membranes are moist.     Pharynx: Oropharynx is clear.  Eyes:     Conjunctiva/sclera: Conjunctivae normal.  Cardiovascular:     Rate and Rhythm: Normal rate and regular rhythm.  Pulmonary:     Effort: Pulmonary effort is normal.     Breath sounds: Normal breath sounds.  Musculoskeletal:     Right lower leg: No edema.     Left lower leg: No edema.  Lymphadenopathy:     Cervical: No cervical adenopathy.  Skin:    General: Skin is warm and dry.     Findings: No rash.  Neurological:     Mental Status: She is alert.  Psychiatric:        Mood and Affect: Mood normal.      Musculoskeletal Exam:  Neck full ROM right sided tenderness to pressure without swelling or palpable nodules Shoulders full ROM no tenderness or swelling Elbows full ROM no tenderness or swelling Wrists full ROM no tenderness or swelling Fingers full ROM no tenderness or swelling Midline and paraspinal low back tenderness to pressure without radiation Left worse than right lateral hip tenderness to pressure Knees full ROM no tenderness or swelling Ankles full ROM no tenderness or swelling MTPs full ROM no tenderness or swelling   Investigation: No additional findings.  Imaging: CT Renal Stone Study  Result Date: 07/22/2022 CLINICAL DATA:  Flank pain EXAM: CT ABDOMEN AND PELVIS WITHOUT CONTRAST TECHNIQUE: Multidetector CT imaging of the abdomen and pelvis was performed following the standard protocol without IV contrast. RADIATION DOSE REDUCTION: This exam was performed according to the  departmental dose-optimization program which includes automated exposure control, adjustment of the mA and/or kV according to patient size and/or use of iterative reconstruction technique. COMPARISON:  CT 08/04/2020 FINDINGS: Lower chest: No acute abnormality. Hepatobiliary: No focal liver abnormality is seen. Gallbladder not well seen, suspect interval cholecystectomy. No biliary dilatation Pancreas: Unremarkable. No pancreatic ductal dilatation or surrounding inflammatory changes. Spleen: Normal in size without focal abnormality. Adrenals/Urinary Tract: Adrenal glands are normal. Prominence of right renal collecting system without frank hydronephrosis. No ureteral stone. Bladder is normal Stomach/Bowel: Stomach is within normal limits. Appendix appears normal. No evidence of bowel wall thickening, distention, or inflammatory changes. Vascular/Lymphatic: No significant vascular findings  are present. No enlarged abdominal or pelvic lymph nodes. Reproductive: IUD in the uterus.  No adnexal mass Other: Negative for pelvic effusion or free air Musculoskeletal: No acute or suspicious osseous abnormality IMPRESSION: 1. Negative for hydronephrosis or ureteral stone. 2. Negative for acute appendicitis. 3. Suspect interval cholecystectomy. Electronically Signed   By: Jasmine Pang M.D.   On: 07/22/2022 22:48    Recent Labs: Lab Results  Component Value Date   WBC 6.2 07/22/2022   HGB 12.6 07/22/2022   PLT 232 07/22/2022   NA 137 07/22/2022   K 3.5 07/22/2022   CL 102 07/22/2022   CO2 25 07/22/2022   GLUCOSE 101 (H) 07/22/2022   BUN 7 07/22/2022   CREATININE 0.79 07/22/2022   BILITOT 0.3 07/22/2022   ALKPHOS 69 07/22/2022   AST 16 07/22/2022   ALT 14 07/22/2022   PROT 6.7 07/22/2022   ALBUMIN 4.0 07/22/2022   CALCIUM 9.0 07/22/2022   GFRAA >60 05/18/2017    Speciality Comments: No specialty comments available.  Procedures:  No procedures performed Allergies: Doxycycline, Amoxicillin-pot  clavulanate, and Penicillins   Assessment / Plan:     Visit Diagnoses: Positive ANA (antinuclear antibody) - Plan: C3 and C4, Chromatin (Nucleosomal) Antibody, Rheumatoid factor, Sedimentation rate, C-reactive protein, IgG, IgA, IgM  Multiple chronic symptoms but no specific clinical criteria noted on history and exam today.  She was negative for specific extractable antibodies although ANA was a moderately high titer.  Check additional antibody markers as above also serum complements sed rate and CRP quantitative immunoglobulins for evidence of active inflammation.  If results are normal would recommend monitoring approach for right now versus new medication.  Facial rash  Reviewed phone images of rash, nothing on exam today. Looks more suggestive of problem like rosacea versus malar rash from the history with nonspecific provocations, short duration of episodes.  Frequent infections History of urticaria  Previous evaluation in allergy clinic describes upper respiratory infection frequency has been better ever since getting pneumococcal vaccine.  Pain in both lower legs  Bilateral distal leg pain occurring more while at rest.  Does not specifically describe restless leg syndrome history.  Nothing abnormal on physical exam today. Will also check for iron deficiency.    Vitamin D deficiency - Plan: VITAMIN D 25 Hydroxy (Vit-D Deficiency, Fractures)  Previous history of vitamin D deficiency has been corrected with high-dose supplementation in the past.  Has not recently been checked for monitoring will check today in setting of recurrent infections and fatigue.  Orders: Orders Placed This Encounter  Procedures   C3 and C4   Chromatin (Nucleosomal) Antibody   Rheumatoid factor   Sedimentation rate   C-reactive protein   IgG, IgA, IgM   VITAMIN D 25 Hydroxy (Vit-D Deficiency, Fractures)   Iron, TIBC and Ferritin Panel   No orders of the defined types were placed in this  encounter.    Follow-Up Instructions: Return in about 4 weeks (around 08/22/2022) for New pt +ANA f/u 37mo.   Fuller Plan, MD  Note - This record has been created using [employer].  Chart creation errors have been sought, but may not always  have been located. Such creation errors do not reflect on  the standard of medical care.

## 2022-07-25 ENCOUNTER — Ambulatory Visit: Payer: Medicaid Other | Attending: Internal Medicine | Admitting: Internal Medicine

## 2022-07-25 ENCOUNTER — Encounter: Payer: Self-pay | Admitting: Internal Medicine

## 2022-07-25 VITALS — BP 116/75 | HR 92 | Resp 12 | Ht 63.5 in | Wt 177.0 lb

## 2022-07-25 DIAGNOSIS — M79661 Pain in right lower leg: Secondary | ICD-10-CM

## 2022-07-25 DIAGNOSIS — R21 Rash and other nonspecific skin eruption: Secondary | ICD-10-CM

## 2022-07-25 DIAGNOSIS — R5383 Other fatigue: Secondary | ICD-10-CM | POA: Diagnosis not present

## 2022-07-25 DIAGNOSIS — Z8619 Personal history of other infectious and parasitic diseases: Secondary | ICD-10-CM | POA: Diagnosis not present

## 2022-07-25 DIAGNOSIS — R768 Other specified abnormal immunological findings in serum: Secondary | ICD-10-CM

## 2022-07-25 DIAGNOSIS — M79662 Pain in left lower leg: Secondary | ICD-10-CM

## 2022-07-25 DIAGNOSIS — E559 Vitamin D deficiency, unspecified: Secondary | ICD-10-CM

## 2022-07-25 DIAGNOSIS — Z872 Personal history of diseases of the skin and subcutaneous tissue: Secondary | ICD-10-CM

## 2022-07-26 LAB — VITAMIN D 25 HYDROXY (VIT D DEFICIENCY, FRACTURES): Vit D, 25-Hydroxy: 33 ng/mL (ref 30–100)

## 2022-07-26 LAB — C3 AND C4
C3 Complement: 158 mg/dL (ref 83–193)
C4 Complement: 26 mg/dL (ref 15–57)

## 2022-07-26 LAB — IGG, IGA, IGM
IgG (Immunoglobin G), Serum: 779 mg/dL (ref 600–1640)
IgM, Serum: 177 mg/dL (ref 50–300)
Immunoglobulin A: 137 mg/dL (ref 47–310)

## 2022-07-26 LAB — IRON,TIBC AND FERRITIN PANEL
%SAT: 9 % (calc) — ABNORMAL LOW (ref 16–45)
Ferritin: 27 ng/mL (ref 16–154)
Iron: 30 ug/dL — ABNORMAL LOW (ref 40–190)
TIBC: 352 mcg/dL (calc) (ref 250–450)

## 2022-07-26 LAB — RHEUMATOID FACTOR: Rheumatoid fact SerPl-aCnc: <10 IU/mL (ref <14)

## 2022-07-26 LAB — SEDIMENTATION RATE: Sed Rate: 19 mm/h (ref 0–20)

## 2022-07-26 LAB — CHROMATIN (NUCLEOSOMAL) ANTIBODY: Chromatin (Nucleosomal) Antibody: <1 AI

## 2022-07-26 LAB — C-REACTIVE PROTEIN: CRP: 4.1 mg/L (ref <8.0)

## 2022-08-22 ENCOUNTER — Ambulatory Visit: Payer: BC Managed Care – PPO | Attending: Internal Medicine | Admitting: Internal Medicine

## 2022-08-22 ENCOUNTER — Encounter: Payer: Self-pay | Admitting: Internal Medicine

## 2022-08-22 VITALS — BP 114/77 | HR 65 | Resp 14 | Ht 63.0 in | Wt 173.0 lb

## 2022-08-22 DIAGNOSIS — R21 Rash and other nonspecific skin eruption: Secondary | ICD-10-CM

## 2022-08-22 DIAGNOSIS — I7300 Raynaud's syndrome without gangrene: Secondary | ICD-10-CM | POA: Diagnosis not present

## 2022-08-22 DIAGNOSIS — R768 Other specified abnormal immunological findings in serum: Secondary | ICD-10-CM | POA: Diagnosis not present

## 2022-08-22 DIAGNOSIS — E611 Iron deficiency: Secondary | ICD-10-CM | POA: Diagnosis not present

## 2022-08-22 NOTE — Progress Notes (Signed)
Office Visit Note  Patient: Angela Rocha             Date of Birth: 02/04/1995           MRN: [id number]             PCP: Carren Rang, PA-C Referring: Carren Rang, PA-C Visit Date: 08/22/2022   Subjective:  Follow-up   History of Present Illness: Angela Rocha is a 27 y.o. female here for follow up for positive ANA with ongoing facial rashes, Raynaud's, joint pain, and chronic fatigue.  Lab workup at our initial visit was mostly negative except for low iron.  Labs in normal range included her vitamin D and specific extractable nuclear antibodies.  Since her last visit she had some more episodes of facial rash breaking out photos reviewed together in clinic today occurred while driving in the car and she says they resolve after about 10 to 15 minutes and a very hot while present.  Still having leg cramping and discomfort sensation most often while lying still in bed.  Previous HPI 07/25/22 [name] is a 27 y.o. female here for evaluation of positive ANA checked and associated with multiple symptoms including chronic fatigue, tremors, Raynaud's, facial rashes, and joint pain. She has a medical history including asthma, migraines, psoriasis, ADHD, and multiple left hip surgeries.   Her biggest complaint is primarily persistent daily fatigue with low energy and often needing up to 14 hours of sleep to feel rested.  Problem has been chronic but feels it is worse in the past approximately 2 years.  Also has trouble with migraine headaches and experiences bilateral hand tremor.  Sees neurology for this currently takes gabapentin 400 mg twice daily with significant but incomplete benefit.  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   Has a history of asthma also previously had problems from recurrent sinus infections.  Since giving  send to control and with repeat pneumococcal vaccination the URI rate has improved still has some issues with recurrent urinary tract infections.  Multiple episodes of cystitis and at least once with pyelonephritis a few years ago.   History of joint pain in several areas biggest problem by far has been injury at the left hip with previous labral tear poor outcomes from arthroscopy requiring several surgical revision.  Now doing well does not have much hip pain though still has some lateral pain or bursitis pretty frequently.  Biggest issue now is low back pain and imaging showing degenerative changes at facet joint of bottom 2 vertebrae.  Also gets some pain in the lower legs involving the calf muscle ankle and foot.  This is most severe when lying for long periods of time frequently has to get up and move around to alleviate this.  Does not see much visible joint swelling.  Has noticed some nodules about type of changes at the back of the ankles.   History of psoriasis with rashes at the base of occiput treated with topical steroid solution but has not needed any treatment for a few years. Facial rash with flushing has not identified specific trigger usually last 5 to 10 minutes is hot but not itchy or painful and no residual changes. Reports this occurring sometimes after hot showers, stress, not specifically sun exposure. Also had rashes in axillae treated with topical medication per derm.  Feels stiffness and tenderness in the right side of the neck feels there is swelling occasionally but usually does not have pain or nodules present on a daily basis.   Review of Systems  Constitutional:  Positive for fatigue.  HENT:  Positive for mouth dryness. Negative for mouth sores.   Eyes:  Positive for dryness.  Respiratory:  Negative for shortness of breath.   Cardiovascular:  Negative for chest pain and palpitations.  Gastrointestinal:  Positive for constipation and diarrhea. Negative for blood in stool.   Endocrine: Negative for increased urination.  Genitourinary:  Negative for involuntary urination.  Musculoskeletal:  Positive for joint pain, joint pain, myalgias, muscle weakness, morning stiffness, muscle tenderness and myalgias. Negative for gait problem and joint swelling.  Skin:  Positive for rash and sensitivity to sunlight. Negative for color change and hair loss.  Allergic/Immunologic: Positive for susceptible to infections.  Neurological:  Positive for dizziness and headaches.  Hematological:  Positive for swollen glands.  Psychiatric/Behavioral:  Positive for depressed mood and sleep disturbance. The patient is nervous/anxious.     PMFS History:  Patient Active Problem List   Diagnosis Date Noted   Iron deficiency 08/22/2022   Raynaud's syndrome without gangrene 08/22/2022   Positive ANA (antinuclear antibody) 07/25/2022   Facial rash 07/25/2022   Other fatigue 07/25/2022   Frequent infections 07/25/2022   Obesity (BMI 30-39.9) 03/09/2022   Family history of colon cancer 08/02/2021   Sprain of left ankle 01/18/2021   Pain from implanted hardware 03/23/2020   History of colon cancer    Asthma, not well controlled 02/12/2020   History of frequent upper respiratory infection 02/12/2020   Adverse reaction to food, subsequent encounter 02/12/2020   Drug reaction 02/12/2020   Heartburn 02/12/2020   History of systemic reaction to hymenoptera sting 02/12/2020   History of urticaria 02/12/2020   Numbness and tingling in left arm 01/16/2020   Other allergic rhinitis 01/08/2020   Migraine without aura and without status migrainosus, not intractable 08/23/2017   Generalized anxiety disorder 05/22/2017   Other acquired deformity of hip 05/22/2017   Pyelonephritis 05/17/2017   Attention deficit hyperactivity disorder (ADHD), 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   Vitamin D deficiency 01/12/2016   B12 deficiency 01/12/2016   Status post arthroscopy of hip  12/25/2015   Left hip pain 03/04/2015   Labral tear of hip, degenerative 02/04/2015   Quadriceps tendinitis 01/06/2015   Femoral neck stress fracture 01/06/2015   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/06/2015   Cephalalgia 03/12/2014    Past Medical History:  Diagnosis Date   ADHD    Anxiety    Asthma    B12 deficiency    Femoral neck stress fracture    GERD (gastroesophageal reflux disease)    Hip dysplasia    History of dysplastic nevus 03/04/2020   right upper back, severe/excision   Migraine    Pelvic congestion syndrome    Pyelonephritis    Recurrent upper respiratory infection (URI)    Urticaria     Family History  Problem Relation Age of Onset   Diabetes Mellitus II Father    Colon cancer Father    Lung cancer Paternal Grandfather    Past Surgical History:  Procedure Laterality Date   ADENOIDECTOMY     COLONOSCOPY WITH PROPOFOL N/A 03/23/2020   Procedure: COLONOSCOPY WITH PROPOFOL;  Surgeon: Toney Reil, MD;  Location: [facility] ENDOSCOPY;  Service: Gastroenterology;  Laterality: N/A;   HIP SURGERY     HARDWARE REMOVAL  X2   PERIACETUBLAR Bilateral    OSTEOTOMY   TONSILLECTOMY     TYMPANOSTOMY TUBE PLACEMENT     Social History   Social History Narrative   Not on file   Immunization History  Administered Date(s) Administered   HIB, Unspecified 12/11/1997, 02/09/1998, 02/03/1999   HPV Quadrivalent 05/24/2010, 05/24/2011, 05/23/2012   Hep A, Unspecified 12/17/2008, 05/24/2010   Hep B, Unspecified 04/10/1998, 07/13/1998   Influenza Inj Mdck Quad Pf 09/28/2021   Influenza,inj,Quad PF,6+ Mos 10/26/2017   Influenza-Unspecified 12/30/2016   MMR 02/03/1999, 08/22/2002   Meningococcal Conjugate 12/17/2008, 08/15/2016   PPD Test 08/25/2021, 09/21/2021, 10/05/2021   Pneumococcal Conjugate-13 10/26/1998, 04/29/1999   Pneumococcal Polysaccharide-23 03/04/2020   Td (Adult),unspecified 12/17/2008   Tdap 12/31/2019   Varicella 10/26/1998, 10/16/2007     Objective: Vital  Signs: BP 114/77 (BP Location: Left Arm, Patient Position: Sitting, Cuff Size: Normal)   Pulse 65   Resp 14   Ht 5\' 3"  (1.6 m)   Wt 173 lb (78.5 kg)   BMI 30.65 kg/m    Physical Exam Eyes:     Conjunctiva/sclera: Conjunctivae normal.  Cardiovascular:     Rate and Rhythm: Normal rate and regular rhythm.  Pulmonary:     Effort: Pulmonary effort is normal.     Breath sounds: Normal breath sounds.  Musculoskeletal:     Right lower leg: No edema.     Left lower leg: No edema.  Lymphadenopathy:     Cervical: No cervical adenopathy.  Skin:    General: Skin is warm and dry.     Findings: No rash.  Neurological:     Mental Status: She is alert.  Psychiatric:        Mood and Affect: Mood normal.      Musculoskeletal Exam:  Shoulders full ROM no tenderness or swelling Elbows full ROM no tenderness or swelling Wrists full ROM no tenderness or swelling Fingers full ROM no tenderness or swelling Knees full ROM no tenderness or swelling   Investigation: No additional findings.  Imaging: No results found.  Recent Labs: Lab Results  Component Value Date   WBC 6.2 07/22/2022   HGB 12.6 07/22/2022   PLT 232 07/22/2022   NA 137 07/22/2022   K 3.5 07/22/2022   CL 102 07/22/2022   CO2 25 07/22/2022   GLUCOSE 101 (H) 07/22/2022   BUN 7 07/22/2022   CREATININE 0.79 07/22/2022   BILITOT 0.3 07/22/2022   ALKPHOS 69 07/22/2022   AST 16 07/22/2022   ALT 14 07/22/2022   PROT 6.7 07/22/2022   ALBUMIN 4.0 07/22/2022   CALCIUM 9.0 07/22/2022   GFRAA >60 05/18/2017    Speciality Comments: No specialty comments available.  Procedures:  No procedures performed Allergies: Doxycycline, Amoxicillin-pot clavulanate, and Penicillins   Assessment / Plan:     Visit Diagnoses: Positive ANA (antinuclear antibody)  Not currently meeting criteria for specific connective tissue disease but with moderately high positive ANA multiple symptoms and young age would recommend scheduled  follow-up for monitoring.   Iron deficiency  Discussed iron deficiency most likely secondary to regular menstrual period blood loss.  Although these are not excessively heavy or irregular currently using IUD.  Discussed addition of oral iron supplement once daily with 65 mg of elemental iron.  Raynaud's syndrome without gangrene  Currently minimal symptoms no lesions no digital pitting or ischemia.  Symptoms may be increased by stimulant medication.  Okay to monitor for now. Raynaud's symptoms would often be more prominent during winter  season to could reassess if any more treatment is needed.  Facial rash  Facial rash looks more consistent with some type of rosacea or vasodilation due to short duration of symptoms and no residual changes.  As compared to a true malar rash or other photosensitive dermatitis.  No specific new intervention at this time.  Orders: No orders of the defined types were placed in this encounter.  No orders of the defined types were placed in this encounter.    Follow-Up Instructions: Return in about 6 months (around 02/22/2023) for +ANA f/u 6mos.   Fuller Plan, MD  Note - This record has been created using [employer].  Chart creation errors have been sought, but may not always  have been located. Such creation errors do not reflect on  the standard of medical care.

## 2022-09-17 ENCOUNTER — Other Ambulatory Visit: Payer: Self-pay

## 2022-09-17 ENCOUNTER — Emergency Department: Payer: BC Managed Care – PPO

## 2022-09-17 ENCOUNTER — Emergency Department
Admission: EM | Admit: 2022-09-17 | Discharge: 2022-09-17 | Disposition: A | Discharge status: Home / Self Care | Payer: BC Managed Care – PPO | Source: Home / Self Care | Attending: Emergency Medicine | Admitting: Emergency Medicine

## 2022-09-17 DIAGNOSIS — N390 Urinary tract infection, site not specified: Secondary | ICD-10-CM | POA: Diagnosis not present

## 2022-09-17 DIAGNOSIS — N3000 Acute cystitis without hematuria: Secondary | ICD-10-CM | POA: Insufficient documentation

## 2022-09-17 DIAGNOSIS — R000 Tachycardia, unspecified: Secondary | ICD-10-CM | POA: Insufficient documentation

## 2022-09-17 LAB — CBC
HCT: 41.5 % (ref 36.0–46.0)
Hemoglobin: 13.7 g/dL (ref 12.0–15.0)
MCH: 29.5 pg (ref 26.0–34.0)
MCHC: 33 g/dL (ref 30.0–36.0)
MCV: 89.2 fL (ref 80.0–100.0)
Platelets: 287 10*3/uL (ref 150–400)
RBC: 4.65 MIL/uL (ref 3.87–5.11)
RDW: 13 % (ref 11.5–15.5)
WBC: 9.8 10*3/uL (ref 4.0–10.5)
nRBC: 0 % (ref 0.0–0.2)

## 2022-09-17 LAB — URINALYSIS, ROUTINE W REFLEX MICROSCOPIC
Bilirubin Urine: NEGATIVE
Glucose, UA: NEGATIVE mg/dL
Hgb urine dipstick: NEGATIVE
Ketones, ur: NEGATIVE mg/dL
Leukocytes,Ua: NEGATIVE
Nitrite: POSITIVE — AB
Protein, ur: 100 mg/dL — AB
Specific Gravity, Urine: 1.033 — ABNORMAL HIGH (ref 1.005–1.030)
WBC, UA: >50 WBC/hpf (ref 0–5)
pH: 5 (ref 5.0–8.0)

## 2022-09-17 LAB — BASIC METABOLIC PANEL
Anion gap: 10 (ref 5–15)
BUN: 13 mg/dL (ref 6–20)
CO2: 26 mmol/L (ref 22–32)
Calcium: 9.3 mg/dL (ref 8.9–10.3)
Chloride: 103 mmol/L (ref 98–111)
Creatinine, Ser: 0.82 mg/dL (ref 0.44–1.00)
GFR, Estimated: >60 mL/min (ref >60)
Glucose, Bld: 116 mg/dL — ABNORMAL HIGH (ref 70–99)
Potassium: 3.9 mmol/L (ref 3.5–5.1)
Sodium: 139 mmol/L (ref 135–145)

## 2022-09-17 LAB — POC URINE PREG, ED: Preg Test, Ur: NEGATIVE

## 2022-09-17 MED ORDER — CEFPODOXIME PROXETIL 200 MG PO TABS: 200.0000 mg | ORAL_TABLET | Freq: Two times a day (BID) | ORAL | 0 refills | Status: DC

## 2022-09-17 MED ORDER — DIPHENHYDRAMINE HCL 50 MG/ML IJ SOLN
12.5000 mg | INTRAMUSCULAR | Status: AC
  Administered 2022-09-17: 12.5 mg via INTRAVENOUS
  Filled 2022-09-17: qty 1

## 2022-09-17 MED ORDER — MORPHINE SULFATE (PF) 4 MG/ML IV SOLN
4.0000 mg | Freq: Once | INTRAVENOUS | Status: AC
  Administered 2022-09-17: 4 mg via INTRAVENOUS
  Filled 2022-09-17: qty 1

## 2022-09-17 MED ORDER — ONDANSETRON HCL 4 MG/2ML IJ SOLN
4.0000 mg | INTRAMUSCULAR | Status: AC
  Administered 2022-09-17: 4 mg via INTRAVENOUS
  Filled 2022-09-17: qty 2

## 2022-09-17 MED ORDER — IOHEXOL 300 MG/ML  SOLN
100.0000 mL | Freq: Once | INTRAMUSCULAR | Status: AC | PRN
  Administered 2022-09-17: 100 mL via INTRAVENOUS

## 2022-09-17 MED ORDER — ONDANSETRON 4 MG PO TBDP: ORAL_TABLET | ORAL | 0 refills | Status: DC

## 2022-09-17 MED ORDER — KETOROLAC TROMETHAMINE 30 MG/ML IJ SOLN
15.0000 mg | Freq: Once | INTRAMUSCULAR | Status: AC
  Administered 2022-09-17: 15 mg via INTRAVENOUS
  Filled 2022-09-17: qty 1

## 2022-09-17 MED ORDER — SODIUM CHLORIDE 0.9 % IV SOLN
2.0000 g | INTRAVENOUS | Status: AC
  Administered 2022-09-17: 2 g via INTRAVENOUS
  Filled 2022-09-17: qty 20

## 2022-09-17 NOTE — ED Triage Notes (Addendum)
Pt to ed from home via POV for urinary pains x 1 week. Was seen and prescribed meds. Pt has hX of kidney infections. Pt is caox4, in no acute distress but restless and ambulatory in triage. Pt advised she is also vomiting up bile.

## 2022-09-17 NOTE — Discharge Instructions (Signed)
You have been seen in the Emergency Department (ED) today for pain when urinating.  Your workup today suggests that you have a urinary tract infection (UTI).  Please take your antibiotic as prescribed and over-the-counter pain medication (Tylenol or Motrin) as needed, but no more than recommended on the label instructions.  Drink PLENTY of fluids.  Given how frequently this happens to you, we strongly encourage you to call the office of Dr. Apolinar Junes on Monday and schedule the next available follow-up appointment.  She may want to further evaluate you for the possibility of having what is called interstitial cystitis, which is more of a chronic inflammatory condition than an acute infectious process.  Call your regular doctor to schedule the next available appointment to follow up on today's ED visit, or return immediately to the ED if your pain worsens, you have decreased urine production, develop fever, persistent vomiting, or other symptoms that concern you.

## 2022-09-17 NOTE — ED Provider Notes (Signed)
[facility] Provider Note    Event Date/Time   First MD Initiated Contact with Patient 09/17/22 0143     (approximate)   History   Flank Pain   HPI BANNER Angela Rocha is a 27 y.o. female who has a history of recurrent UTIs and history of pyelonephritis in the past.  She presents for evaluation of severe pain in her suprapubic region that is radiating up to primarily the right flank but little bit to the left as well.  She said that she was treated about 10 days ago with Macrobid for UTI.  She has completed the course of treatment but the pain has gotten much worse.  No fever but she has had nausea and vomiting as well as severe pain and a feeling like she has to urinate constantly but is not able to pass much urine.  This is very similar to what she has felt in the past repeatedly.  She has been told in the past that she also may have interstitial cystitis but she has not yet followed up with a urologist.     Physical Exam   Triage Vital Signs: ED Triage Vitals  Encounter Vitals Group     BP 09/17/22 0046 (!) 132/90     Systolic BP Percentile --      Diastolic BP Percentile --      Pulse Rate 09/17/22 0046 96     Resp 09/17/22 0046 16     Temp 09/17/22 0046 98 F (36.7 C)     Temp Source 09/17/22 0046 Oral     SpO2 09/17/22 0046 98 %     Weight 09/17/22 0047 79.4 kg (175 lb)     Height 09/17/22 0047 1.6 m (5\' 3" )     Head Circumference --      Peak Flow --      Pain Score 09/17/22 0047 8     Pain Loc --      Pain Education --      Exclude from Growth Chart --     Most recent vital signs: Vitals:   09/17/22 0525 09/17/22 0530  BP:  125/78  Pulse: 100 100  Resp:  16  Temp:  98.2 F (36.8 C)  SpO2: 95% 95%    General: Awake, crying because of pain. CV:  Good peripheral perfusion.  Borderline tachycardia, regular rhythm. Resp:  Normal effort. Speaking easily and comfortably, no accessory muscle usage nor intercostal retractions.   Abd:  No  distention.  Tenderness to palpation of the suprapubic region and tenderness to percussion in the right flank. Other:  Mood and affect are very upset and anxious as a result of the pain she is experiencing.   ED Results / Procedures / Treatments   Labs (all labs ordered are listed, but only abnormal results are displayed) Labs Reviewed  URINALYSIS, ROUTINE W REFLEX MICROSCOPIC - Abnormal; Notable for the following components:      Result Value   Color, Urine AMBER (*)    APPearance CLOUDY (*)    Specific Gravity, Urine 1.033 (*)    Protein, ur 100 (*)    Nitrite POSITIVE (*)    Bacteria, UA MANY (*)    All other components within normal limits  BASIC METABOLIC PANEL - Abnormal; Notable for the following components:   Glucose, Bld 116 (*)    All other components within normal limits  URINE CULTURE  CBC  POC URINE PREG, ED  RADIOLOGY I viewed and interpreted the patient's CT of the abdomen and pelvis.   See hospital course for details   PROCEDURES:  Critical Care performed: No  Procedures    IMPRESSION / MDM / ASSESSMENT AND PLAN / ED COURSE  I reviewed the triage vital signs and the nursing notes.                              Differential diagnosis includes, but is not limited to, UTI, pyelonephritis, interstitial cystitis, STD/PID/TOA  Patient's presentation is most consistent with acute presentation with potential threat to life or bodily function.  Labs/studies ordered: Urinalysis, urine culture, urine pregnancy test, BMP, CBC, CT abdomen/pelvis  Interventions/Medications given:  Medications  ondansetron (ZOFRAN) injection 4 mg (has no administration in time range)  ketorolac (TORADOL) 30 MG/ML injection 15 mg (15 mg Intravenous Given 09/17/22 0254)  morphine (PF) 4 MG/ML injection 4 mg (4 mg Intravenous Given 09/17/22 0304)  ondansetron (ZOFRAN) injection 4 mg (4 mg Intravenous Given 09/17/22 0304)  cefTRIAXone (ROCEPHIN) 2 g in sodium chloride 0.9 % 100  mL IVPB (0 g Intravenous Stopped 09/17/22 0530)  diphenhydrAMINE (BENADRYL) injection 12.5 mg (12.5 mg Intravenous Given 09/17/22 0304)  iohexol (OMNIPAQUE) 300 MG/ML solution 100 mL (100 mLs Intravenous Contrast Given 09/17/22 0403)    (Note:  hospital course my include additional interventions and/or labs/studies not listed above.)   I verified in the medical record that the patient has had multiple infections in the past including pyelonephritis.  However I looked at her last urine culture and it showed multiple species and no 1 clear pathogen.  Her labs tonight are mostly reassuring including no leukocytosis and a normal metabolic panel, but her urinalysis appears grossly infected with many bacteria and nitrite positive.  However interestingly it is negative for leukocytes.  I talked with her about the importance of following up with urology and the possibility of interstitial cystitis as a cause for her persistent symptoms.  However given that she has been treated recently with Macrobid and has what appears to be a grossly positive UA, I will treat empirically with ceftriaxone 2 g IV.  Given the amount of pain she is experiencing I will also obtain a CT of the abdomen pelvis with contrast to both rule out the possibility of an infected kidney stone but also look for radiographic evidence of pyelonephritis.  Patient agrees with the plan.  Medications as listed above.     Clinical Course as of 09/17/22 0539  Sat Sep 17, 2022  0533 CT ABDOMEN PELVIS W CONTRAST I viewed and interpreted the patient's CT scan and I cannot find any evidence of acute or emergent medical condition.  I also read the radiologist's report, which confirmed no acute findings. [CF]  0533 I reassessed the patient and she feels better although she said that her bladder still hurts.  We talked about outpatient management and she agrees and will follow-up with a call to the office of Dr. Apolinar Junes on Monday to discuss follow-up given  how often this happens to her.  I have verified in the [place] controlled substance database that she does not have any concerning prescribing habits associated with opioids but at this point she is not asking for any opioid pain medicine and I think she should be appropriate for ibuprofen and Tylenol now that we have begun aggressive antibiotic therapy.  I gave my usual and customary follow-up recommendations and return  precautions and the patient understands and agrees with the plan. [CF]    Clinical Course User Index [CF] Loleta Rose, MD     FINAL CLINICAL IMPRESSION(S) / ED DIAGNOSES   Final diagnoses:  Acute cystitis without hematuria     Rx / DC Orders   ED Discharge Orders          Ordered    cefpodoxime (VANTIN) 200 MG tablet  2 times daily        09/17/22 0537    ondansetron (ZOFRAN-ODT) 4 MG disintegrating tablet        09/17/22 0537             Note:  This document was prepared using Dragon voice recognition software and may include unintentional dictation errors.   Loleta Rose, MD 09/17/22 [phone]

## 2022-09-17 NOTE — ED Notes (Signed)
Pt was given a cup and went to the bathroom to provide a urine specimen. Pt came out moments later and advised she is unable to do so.

## 2022-09-19 LAB — URINE CULTURE: Culture: >=100000 — AB

## 2022-09-19 NOTE — Group Note (Deleted)

## 2022-09-20 ENCOUNTER — Encounter: Payer: Self-pay | Admitting: Emergency Medicine

## 2022-09-20 ENCOUNTER — Telehealth: Payer: Self-pay

## 2022-09-20 ENCOUNTER — Inpatient Hospital Stay
Admission: EM | Admit: 2022-09-20 | Discharge: 2022-09-23 | DRG: 690 | Disposition: A | Discharge status: Home / Self Care | Payer: BC Managed Care – PPO | Attending: Internal Medicine | Admitting: Internal Medicine

## 2022-09-20 ENCOUNTER — Other Ambulatory Visit: Payer: Self-pay

## 2022-09-20 DIAGNOSIS — F411 Generalized anxiety disorder: Secondary | ICD-10-CM | POA: Diagnosis present

## 2022-09-20 DIAGNOSIS — Z7951 Long term (current) use of inhaled steroids: Secondary | ICD-10-CM | POA: Diagnosis not present

## 2022-09-20 DIAGNOSIS — Z833 Family history of diabetes mellitus: Secondary | ICD-10-CM

## 2022-09-20 DIAGNOSIS — Z800 Family history of malignant neoplasm of digestive organs: Secondary | ICD-10-CM

## 2022-09-20 DIAGNOSIS — Z880 Allergy status to penicillin: Secondary | ICD-10-CM | POA: Diagnosis not present

## 2022-09-20 DIAGNOSIS — F32A Depression, unspecified: Secondary | ICD-10-CM | POA: Diagnosis present

## 2022-09-20 DIAGNOSIS — E669 Obesity, unspecified: Secondary | ICD-10-CM | POA: Diagnosis present

## 2022-09-20 DIAGNOSIS — J4520 Mild intermittent asthma, uncomplicated: Secondary | ICD-10-CM

## 2022-09-20 DIAGNOSIS — K219 Gastro-esophageal reflux disease without esophagitis: Secondary | ICD-10-CM | POA: Diagnosis present

## 2022-09-20 DIAGNOSIS — G43009 Migraine without aura, not intractable, without status migrainosus: Secondary | ICD-10-CM | POA: Diagnosis present

## 2022-09-20 DIAGNOSIS — A499 Bacterial infection, unspecified: Secondary | ICD-10-CM | POA: Diagnosis present

## 2022-09-20 DIAGNOSIS — Z1612 Extended spectrum beta lactamase (ESBL) resistance: Secondary | ICD-10-CM | POA: Diagnosis present

## 2022-09-20 DIAGNOSIS — F908 Attention-deficit hyperactivity disorder, other type: Secondary | ICD-10-CM

## 2022-09-20 DIAGNOSIS — Z79899 Other long term (current) drug therapy: Secondary | ICD-10-CM | POA: Diagnosis not present

## 2022-09-20 DIAGNOSIS — F902 Attention-deficit hyperactivity disorder, combined type: Secondary | ICD-10-CM | POA: Diagnosis present

## 2022-09-20 DIAGNOSIS — J45909 Unspecified asthma, uncomplicated: Secondary | ICD-10-CM | POA: Diagnosis present

## 2022-09-20 DIAGNOSIS — B9620 Unspecified Escherichia coli [E. coli] as the cause of diseases classified elsewhere: Secondary | ICD-10-CM | POA: Diagnosis present

## 2022-09-20 DIAGNOSIS — N12 Tubulo-interstitial nephritis, not specified as acute or chronic: Secondary | ICD-10-CM | POA: Diagnosis present

## 2022-09-20 DIAGNOSIS — Z801 Family history of malignant neoplasm of trachea, bronchus and lung: Secondary | ICD-10-CM | POA: Diagnosis not present

## 2022-09-20 DIAGNOSIS — F419 Anxiety disorder, unspecified: Secondary | ICD-10-CM

## 2022-09-20 DIAGNOSIS — R109 Unspecified abdominal pain: Secondary | ICD-10-CM

## 2022-09-20 DIAGNOSIS — E538 Deficiency of other specified B group vitamins: Secondary | ICD-10-CM | POA: Diagnosis present

## 2022-09-20 DIAGNOSIS — E611 Iron deficiency: Secondary | ICD-10-CM | POA: Diagnosis present

## 2022-09-20 DIAGNOSIS — N390 Urinary tract infection, site not specified: Principal | ICD-10-CM

## 2022-09-20 DIAGNOSIS — R112 Nausea with vomiting, unspecified: Secondary | ICD-10-CM | POA: Diagnosis present

## 2022-09-20 DIAGNOSIS — Z881 Allergy status to other antibiotic agents status: Secondary | ICD-10-CM | POA: Diagnosis not present

## 2022-09-20 LAB — BASIC METABOLIC PANEL
Anion gap: 10 (ref 5–15)
BUN: 12 mg/dL (ref 6–20)
CO2: 25 mmol/L (ref 22–32)
Calcium: 9.1 mg/dL (ref 8.9–10.3)
Chloride: 101 mmol/L (ref 98–111)
Creatinine, Ser: 0.84 mg/dL (ref 0.44–1.00)
GFR, Estimated: >60 mL/min (ref >60)
Glucose, Bld: 106 mg/dL — ABNORMAL HIGH (ref 70–99)
Potassium: 4 mmol/L (ref 3.5–5.1)
Sodium: 136 mmol/L (ref 135–145)

## 2022-09-20 LAB — LACTIC ACID, PLASMA
Lactic Acid, Venous: 0.6 mmol/L (ref 0.5–1.9)
Lactic Acid, Venous: 0.8 mmol/L (ref 0.5–1.9)

## 2022-09-20 LAB — CBC
HCT: 37.3 % (ref 36.0–46.0)
Hemoglobin: 11.9 g/dL — ABNORMAL LOW (ref 12.0–15.0)
MCH: 29.5 pg (ref 26.0–34.0)
MCHC: 31.9 g/dL (ref 30.0–36.0)
MCV: 92.3 fL (ref 80.0–100.0)
Platelets: 231 10*3/uL (ref 150–400)
RBC: 4.04 MIL/uL (ref 3.87–5.11)
RDW: 12.8 % (ref 11.5–15.5)
WBC: 8 10*3/uL (ref 4.0–10.5)
nRBC: 0 % (ref 0.0–0.2)

## 2022-09-20 MED ORDER — MORPHINE SULFATE (PF) 4 MG/ML IV SOLN
4.0000 mg | Freq: Once | INTRAVENOUS | Status: AC
  Administered 2022-09-20: 4 mg via INTRAVENOUS
  Filled 2022-09-20: qty 1

## 2022-09-20 MED ORDER — CYANOCOBALAMIN 1000 MCG/ML IJ SOLN
1000.0000 ug | Freq: Once | INTRAMUSCULAR | Status: AC
  Administered 2022-09-20: 1000 ug via INTRAMUSCULAR
  Filled 2022-09-20 (×2): qty 1

## 2022-09-20 MED ORDER — SODIUM CHLORIDE 0.9 % IV SOLN: INTRAVENOUS | Status: AC

## 2022-09-20 MED ORDER — ONDANSETRON HCL 4 MG PO TABS
4.0000 mg | ORAL_TABLET | Freq: Four times a day (QID) | ORAL | Status: DC | PRN
  Administered 2022-09-21: 4 mg via ORAL
  Filled 2022-09-20: qty 1

## 2022-09-20 MED ORDER — GABAPENTIN 400 MG PO CAPS
400.0000 mg | ORAL_CAPSULE | Freq: Two times a day (BID) | ORAL | Status: DC
  Administered 2022-09-20 – 2022-09-23 (×6): 400 mg via ORAL
  Filled 2022-09-20 (×6): qty 1

## 2022-09-20 MED ORDER — BUSPIRONE HCL 10 MG PO TABS
15.0000 mg | ORAL_TABLET | Freq: Two times a day (BID) | ORAL | Status: DC
  Administered 2022-09-20 – 2022-09-23 (×6): 15 mg via ORAL
  Filled 2022-09-20 (×6): qty 2

## 2022-09-20 MED ORDER — MONTELUKAST SODIUM 10 MG PO TABS
10.0000 mg | ORAL_TABLET | Freq: Every day | ORAL | Status: DC
  Administered 2022-09-20 – 2022-09-22 (×3): 10 mg via ORAL
  Filled 2022-09-20 (×3): qty 1

## 2022-09-20 MED ORDER — ONDANSETRON HCL 4 MG/2ML IJ SOLN
4.0000 mg | Freq: Four times a day (QID) | INTRAMUSCULAR | Status: DC | PRN
  Administered 2022-09-21 – 2022-09-23 (×7): 4 mg via INTRAVENOUS
  Filled 2022-09-20 (×9): qty 2

## 2022-09-20 MED ORDER — SODIUM CHLORIDE 0.9 % IV SOLN
1.0000 g | Freq: Once | INTRAVENOUS | Status: AC
  Administered 2022-09-20: 1 g via INTRAVENOUS
  Filled 2022-09-20: qty 20

## 2022-09-20 MED ORDER — SODIUM CHLORIDE 0.9 % IV SOLN
1.0000 g | Freq: Three times a day (TID) | INTRAVENOUS | Status: AC
  Administered 2022-09-20 – 2022-09-23 (×7): 1 g via INTRAVENOUS
  Filled 2022-09-20 (×8): qty 20

## 2022-09-20 MED ORDER — LORATADINE 10 MG PO TABS
10.0000 mg | ORAL_TABLET | Freq: Every day | ORAL | Status: DC
  Administered 2022-09-20 – 2022-09-22 (×3): 10 mg via ORAL
  Filled 2022-09-20 (×3): qty 1

## 2022-09-20 MED ORDER — KETOROLAC TROMETHAMINE 15 MG/ML IJ SOLN
15.0000 mg | Freq: Once | INTRAMUSCULAR | Status: AC
  Administered 2022-09-20: 15 mg via INTRAVENOUS
  Filled 2022-09-20: qty 1

## 2022-09-20 MED ORDER — LACTATED RINGERS IV BOLUS
1000.0000 mL | Freq: Once | INTRAVENOUS | Status: AC
  Administered 2022-09-20: 1000 mL via INTRAVENOUS

## 2022-09-20 MED ORDER — MORPHINE SULFATE (PF) 2 MG/ML IV SOLN
1.0000 mg | INTRAVENOUS | Status: DC | PRN
  Administered 2022-09-20 – 2022-09-21 (×3): 2 mg via INTRAVENOUS
  Filled 2022-09-20 (×3): qty 1

## 2022-09-20 MED ORDER — SERTRALINE HCL 50 MG PO TABS
150.0000 mg | ORAL_TABLET | Freq: Every day | ORAL | Status: DC
  Administered 2022-09-20 – 2022-09-22 (×3): 150 mg via ORAL
  Filled 2022-09-20 (×3): qty 3

## 2022-09-20 MED ORDER — ALBUTEROL SULFATE (2.5 MG/3ML) 0.083% IN NEBU: 3.0000 mL | INHALATION_SOLUTION | Freq: Four times a day (QID) | RESPIRATORY_TRACT | Status: DC | PRN

## 2022-09-20 MED ORDER — LACTATED RINGERS IV BOLUS: 500.0000 mL | Freq: Once | INTRAVENOUS | Status: DC

## 2022-09-20 MED ORDER — ACETAMINOPHEN 325 MG PO TABS
650.0000 mg | ORAL_TABLET | Freq: Four times a day (QID) | ORAL | Status: DC | PRN
  Administered 2022-09-21: 650 mg via ORAL
  Filled 2022-09-20: qty 2

## 2022-09-20 MED ORDER — ONDANSETRON HCL 4 MG/2ML IJ SOLN
4.0000 mg | Freq: Once | INTRAMUSCULAR | Status: AC
  Administered 2022-09-20: 4 mg via INTRAVENOUS
  Filled 2022-09-20: qty 2

## 2022-09-20 MED ORDER — ENOXAPARIN SODIUM 40 MG/0.4ML IJ SOSY
40.0000 mg | PREFILLED_SYRINGE | INTRAMUSCULAR | Status: DC
  Administered 2022-09-20 – 2022-09-22 (×3): 40 mg via SUBCUTANEOUS
  Filled 2022-09-20 (×3): qty 0.4

## 2022-09-20 MED ORDER — TIZANIDINE HCL 4 MG PO TABS
4.0000 mg | ORAL_TABLET | Freq: Every day | ORAL | Status: DC
  Administered 2022-09-20 – 2022-09-22 (×3): 4 mg via ORAL
  Filled 2022-09-20 (×4): qty 1

## 2022-09-20 NOTE — ED Provider Notes (Signed)
[facility] Provider Note    Event Date/Time   First MD Initiated Contact with Patient 09/20/22 1505     (approximate)   History   Urine Infection   HPI  Angela Rocha is a 27 y.o. female past medical history significant for resistant urinary tract infections who presents to the emergency department with urine culture result.  Patient was recently evaluated in the emergency department for symptoms concerning for urinary tract infection.  She had a CT scan at that time that did not show any acute abnormalities.  Patient just completed a course of 10 days of Macrobid.  Continued to have a urine that appeared infected so she was given IV Rocephin and switch to a different antibiotic.  Urine culture resulted as ESBL.  History of prior hospitalization for ESBL in the past.  Since Friday ongoing symptoms of right sided flank pain with nausea and vomiting.  Subjective fever at home.  Ongoing dysuria.     Physical Exam   Triage Vital Signs: ED Triage Vitals [09/20/22 1423]  Encounter Vitals Group     BP (!) 126/90     Systolic BP Percentile      Diastolic BP Percentile      Pulse Rate 80     Resp 17     Temp 98.3 F (36.8 C)     Temp Source Oral     SpO2 96 %     Weight 170 lb (77.1 kg)     Height 5\' 3"  (1.6 m)     Head Circumference      Peak Flow      Pain Score 0     Pain Loc      Pain Education      Exclude from Growth Chart     Most recent vital signs: Vitals:   09/20/22 1423  BP: (!) 126/90  Pulse: 80  Resp: 17  Temp: 98.3 F (36.8 C)  SpO2: 96%    Physical Exam Constitutional:      Appearance: She is well-developed.  HENT:     Head: Atraumatic.  Eyes:     Conjunctiva/sclera: Conjunctivae normal.  Cardiovascular:     Rate and Rhythm: Regular rhythm.  Pulmonary:     Effort: No respiratory distress.  Abdominal:     General: There is no distension.     Tenderness: There is right CVA tenderness.  Musculoskeletal:         General: Normal range of motion.     Cervical back: Normal range of motion.  Skin:    General: Skin is warm.     Capillary Refill: Capillary refill takes less than 2 seconds.  Neurological:     Mental Status: She is alert. Mental status is at baseline.      IMPRESSION / MDM / ASSESSMENT AND PLAN / ED COURSE  I reviewed the triage vital signs and the nursing notes.  Differential diagnosis including pyelonephritis, ESBL, infected kidney stone  On chart review patient recently had a CT scan that did not show any signs of kidney stones.  CT scan also did not show any signs of pyelonephritis.  On chart review urine culture resulted as ESBL.  Discussed with pharmacist, recommended meropenem.  Labs (all labs ordered are listed, but only abnormal results are displayed) Labs interpreted as -    Labs Reviewed  CULTURE, BLOOD (SINGLE)  CBC  BASIC METABOLIC PANEL  LACTIC ACID, PLASMA  LACTIC ACID, PLASMA  Lab work obtained.  Pregnancy test 2 days ago was negative.  Patient without any concerns for pregnancy.  Given recent CT scan without signs of a kidney stone we will hold off on any further CT scans at this time.  Most likely pyelonephritis from untreated cystitis.  Patient given IV fluids, IV antiemetics, ketorolac for pain control and IV meropenem.  Consulted hospitalist for admission for ESBL and pyelonephritis.   PROCEDURES:  Critical Care performed: No  Procedures  Patient's presentation is most consistent with acute presentation with potential threat to life or bodily function.   MEDICATIONS ORDERED IN ED: Medications  meropenem (MERREM) 1 g in sodium chloride 0.9 % 100 mL IVPB (has no administration in time range)  ondansetron (ZOFRAN) injection 4 mg (has no administration in time range)  lactated ringers bolus 1,000 mL (has no administration in time range)  ketorolac (TORADOL) 15 MG/ML injection 15 mg (has no administration in time range)    FINAL CLINICAL  IMPRESSION(S) / ED DIAGNOSES   Final diagnoses:  ESBL (extended spectrum beta-lactamase) producing bacteria infection  Pyelonephritis     Rx / DC Orders   ED Discharge Orders     None        Note:  This document was prepared using Dragon voice recognition software and may include unintentional dictation errors.   Corena Herter, MD 09/20/22 [phone]

## 2022-09-20 NOTE — Consult Note (Signed)
Pharmacy Antibiotic Note  Angela Rocha is a 27 y.o. female admitted on 09/20/2022 with ESBL UTI.  PMH significant for recurrent UTI. Presented to ED with abdominal pain, nausea and vomiting, tactile fever x 10 days. Patient was given Macrobid for UTI which she finished a course last Friday.  Pharmacy has been consulted for meropenem dosing.  Plan: Initiate meropenem 1 gram Q8H  Height: 5\' 3"  (160 cm) Weight: 77.1 kg (170 lb) IBW/kg (Calculated) : 52.4  Temp (24hrs), Avg:98.3 F (36.8 C), Min:98.3 F (36.8 C), Max:98.3 F (36.8 C)  Recent Labs  Lab 09/17/22 0049 09/20/22 1603  WBC 9.8 8.0  CREATININE 0.82 0.84  LATICACIDVEN  --  0.6    Estimated Creatinine Clearance: 101.6 mL/min (by C-G formula based on SCr of 0.84 mg/dL).    Allergies  Allergen Reactions   Doxycycline Nausea And Vomiting    nausea   Amoxicillin-Pot Clavulanate Rash and Other (See Comments)    Has patient had a PCN reaction causing immediate rash, facial/tongue/throat swelling, SOB or lightheadedness with hypotension: Yes Has patient had a PCN reaction causing severe rash involving mucus membranes or skin necrosis: No Has patient had a PCN reaction that required hospitalization: No Has patient had a PCN reaction occurring within the last 10 years: No If all of the above answers are "NO", then may proceed with Cephalosporin use.    Penicillins Rash    Antimicrobials this admission: 9/10 meropenem >>    Dose adjustments this admission:  Microbiology results: 9/10 BCx: sent 9/7 UCx: >=100,000 COLONIES/mL ESCHERICHIA COLI ESBL    Thank you for allowing pharmacy to be a part of this patient's care.  Sharen Hones, PharmD, BCPS Clinical Pharmacist   09/20/2022 9:12 PM

## 2022-09-20 NOTE — ED Triage Notes (Signed)
Pt sts that she was called today due to her urine culture. Pt sts that her culture grew escherichia coli greater than 100,000 colonies and she was told to return for carbapenems IV abx.

## 2022-09-20 NOTE — Consult Note (Signed)
ED Antimicrobial Stewardship Positive Culture Follow Up   Angela Rocha is an 27 y.o. female who presented to [facility] on 09/17/2022 with a chief complaint of flank pain.  Recent Results (from the past 720 hour(s))  Urine Culture     Status: Abnormal   Collection Time: 09/17/22  1:43 AM   Specimen: Urine, Clean Catch  Result Value Ref Range Status   Specimen Description   Final    URINE, CLEAN CATCH Performed at [facility], [address] [place], [place] [address]    Special Requests   Final    NONE Performed at [facility], [address] [place], [place] [address]    Culture (A)  Final    >=100,000 COLONIES/mL ESCHERICHIA COLI Confirmed Extended Spectrum Beta-Lactamase Producer (ESBL).  In bloodstream infections from ESBL organisms, carbapenems are preferred over piperacillin/tazobactam. They are shown to have a lower risk of mortality.    Report Status 09/19/2022 FINAL  Final   Organism ID, Bacteria ESCHERICHIA COLI (A)  Final      Susceptibility   Escherichia coli - MIC*    AMPICILLIN >=32 RESISTANT Resistant     CEFAZOLIN >=64 RESISTANT Resistant     CEFEPIME 16 RESISTANT Resistant     CEFTRIAXONE >=64 RESISTANT Resistant     CIPROFLOXACIN >=4 RESISTANT Resistant     GENTAMICIN <=1 SENSITIVE Sensitive     IMIPENEM <=0.25 SENSITIVE Sensitive     NITROFURANTOIN <=16 SENSITIVE Sensitive     TRIMETH/SULFA >=320 RESISTANT Resistant     AMPICILLIN/SULBACTAM 4 SENSITIVE Sensitive     PIP/TAZO <=4 SENSITIVE Sensitive     * >=100,000 COLONIES/mL ESCHERICHIA COLI   [x]  Treated with cefpodoxime, organism resistant to prescribed antimicrobial []  Patient discharged originally without antimicrobial agent and treatment is now indicated  ED Provider: Dr. Erma Heritage  Comment: Spoke with patient 9/10 who reported she is not feeling much better and is having chills and night sweats. Recommended patient come back in for further evaluation which she agreed to.    Littie Deeds ,PharmD PGY1 Pharmacy Resident 09/20/2022, 11:58 AM

## 2022-09-20 NOTE — H&P (Signed)
History and Physical    Patient: Angela Rocha WUJ:[id number] DOB: [birth year] DOA: 09/20/2022 DOS: the patient was seen and examined on 09/20/2022 PCP: Carren Rang, PA-C  Patient coming from: Home  Chief Complaint:  Chief Complaint  Patient presents with   Urine Infection   HPI: Angela Rocha is a 27 y.o. female with medical history significant of with past medical history significant for recurrent UTI, ADHD, anxiety, asthma, migraine presented to the emergency department for abdominal pain, nausea and vomiting, tactile fever since last 10 days.  Patient was given Macrobid for UTI which she finished a course last Friday.  Patient does have frequent urination, nausea and abdominal discomfort.  Patient had a similar episodes previously most recent from in May when she was admitted for interstitial cystitis, also had prior hospitalization for ESBL.  Patient was last seen 2 days ago in the emergency department when she was given IV Rocephin, cultures obtained, CT abdomen pelvis unremarkable was sent home on Vantin therapy.  Today cultures came back positive for ESBL E.coli and she was advised to come to the emergency department for further management and evaluation.  Since last 2 days she did not feel any better.  In the emergency department, patient is afebrile does not have a white count or lactic acid.  Urine culture/sensitivities reviewed.  Blood cultures obtained.  Patient is started on IV meropenem therapy, hospitalist consulted for inpatient admission.  Review of Systems: A complete review of systems reviewed, pertinent positives and negatives as mentioned above.  Rest of the systems negative. Past Medical History:  Diagnosis Date   ADHD    Anxiety    Asthma    B12 deficiency    Femoral neck stress fracture    GERD (gastroesophageal reflux disease)    Hip dysplasia    History of dysplastic nevus 03/04/2020   right upper back, severe/excision   Migraine    Pelvic congestion  syndrome    Pyelonephritis    Recurrent upper respiratory infection (URI)    Urticaria    Past Surgical History:  Procedure Laterality Date   ADENOIDECTOMY     COLONOSCOPY WITH PROPOFOL N/A 03/23/2020   Procedure: COLONOSCOPY WITH PROPOFOL;  Surgeon: Toney Reil, MD;  Location: [facility] ENDOSCOPY;  Service: Gastroenterology;  Laterality: N/A;   HIP SURGERY     HARDWARE REMOVAL X2   PERIACETUBLAR Bilateral    OSTEOTOMY   TONSILLECTOMY     TYMPANOSTOMY TUBE PLACEMENT     Social History:  reports that she has never smoked. She has never been exposed to tobacco smoke. She has never used smokeless tobacco. She reports current alcohol use. She reports that she does not use drugs.  Allergies  Allergen Reactions   Doxycycline Nausea And Vomiting    nausea   Amoxicillin-Pot Clavulanate Rash and Other (See Comments)    Has patient had a PCN reaction causing immediate rash, facial/tongue/throat swelling, SOB or lightheadedness with hypotension: Yes Has patient had a PCN reaction causing severe rash involving mucus membranes or skin necrosis: No Has patient had a PCN reaction that required hospitalization: No Has patient had a PCN reaction occurring within the last 10 years: No If all of the above answers are "NO", then may proceed with Cephalosporin use.    Penicillins Rash    Family History  Problem Relation Age of Onset   Diabetes Mellitus II Father    Colon cancer Father    Lung cancer Paternal Grandfather     Prior to Admission  medications   Medication Sig Start Date End Date Taking? Authorizing Provider  albuterol (VENTOLIN HFA) 108 (90 Base) MCG/ACT inhaler Inhale 2 puffs into the lungs every 6 (six) hours as needed for wheezing or shortness of breath. 01/08/20   Coral Ceo, NP  amphetamine-dextroamphetamine (ADDERALL XR) 25 MG 24 hr capsule Take by mouth. 03/08/22   [provider]  budesonide-formoterol (SYMBICORT) 160-4.5 MCG/ACT inhaler Inhale 2 puffs into  the lungs 2 (two) times daily. Patient not taking: Reported on 08/22/2022 10/29/20   Leslye Peer, MD  busPIRone (BUSPAR) 15 MG tablet Take by mouth. 08/04/22   [provider]  cefdinir (OMNICEF) 300 MG capsule Take 1 capsule (300 mg total) by mouth 2 (two) times daily. Patient not taking: Reported on 08/22/2022 07/22/22   Glyn Ade, MD  cefpodoxime (VANTIN) 200 MG tablet Take 1 tablet (200 mg total) by mouth 2 (two) times daily for 14 days. 09/17/22 10/01/22  Loleta Rose, MD  cetirizine (ZYRTEC) 10 MG tablet Take 10 mg by mouth daily. 04/17/17   [provider]  clobetasol (TEMOVATE) 0.05 % external solution Apply to aa's scalp QD PRN. Avoid the face, groin, and underarms. 01/18/22   Moye, [place], MD  cloNIDine (CATAPRES) 0.1 MG tablet Take 0.1 mg by mouth 2 (two) times daily. Patient not taking: Reported on 07/25/2022 10/19/21   [provider]  cyanocobalamin (,VITAMIN B-12,) 1000 MCG/ML injection INJECT 1 ML (1,000 MCG TOTAL) INTO THE MUSCLE EVERY 30 (THIRTY) DAYS. 12/11/17   Shambley, Melody N, CNM  fluticasone (FLONASE) 50 MCG/ACT nasal spray Place 1 spray into both nostrils 2 (two) times daily as needed for allergies. 02/12/20   Ellamae Sia, DO  gabapentin (NEURONTIN) 400 MG capsule Take 400 mg by mouth 2 (two) times daily.    [provider]  Galcanezumab-gnlm (EMGALITY) 120 MG/ML SOAJ Inject into the skin. 01/20/20   [provider]  hydrocortisone 2.5 % cream Apply to affected areas left eye area twice a day as needed for up to 1 week. 05/21/20   Moye, [place], MD  levonorgestrel (MIRENA) 20 MCG/24HR IUD by Intrauterine route. 01/14/20   [provider]  montelukast (SINGULAIR) 10 MG tablet Take 10 mg by mouth at bedtime. 09/21/21   [provider]  ondansetron (ZOFRAN) 4 MG tablet Take 1 tablet (4 mg total) by mouth every 6 (six) hours. 07/22/22   Glyn Ade, MD  ondansetron (ZOFRAN-ODT) 4 MG disintegrating tablet Allow  1-2 tablets to dissolve in your mouth every 8 hours as needed for nausea/vomiting 09/17/22   Loleta Rose, MD  phenazopyridine (PYRIDIUM) 100 MG tablet Take 1 tablet (100 mg total) by mouth 3 (three) times daily as needed for pain. Patient not taking: Reported on 07/19/2022 06/10/22   Evon Slack, PA-C  pimecrolimus (ELIDEL) 1 % cream APPLY 1-2 TIMES DAILY AS NEEDED TO RASH AT [facility] AND AX[facility]. Patient not taking: Reported on 07/25/2022 06/22/22   Willeen Niece, MD  predniSONE (STERAPRED UNI-PAK 21 TAB) 10 MG (21) TBPK tablet Take by mouth daily. As directed Patient not taking: Reported on 07/25/2022 07/19/22   Mickie Bail, NP  propranolol (INDERAL) 10 MG tablet Take 10 mg by mouth daily. 08/09/22   [provider]  QELBREE 100 MG 24 hr capsule Take 100 mg by mouth daily. Patient not taking: Reported on 08/22/2022 03/08/22   [provider]  QELBREE 200 MG 24 hr capsule Take 200 mg by mouth daily. 08/09/22   [provider]  rizatriptan (MAXALT-MLT) 10 MG disintegrating tablet Take 10 mg by mouth as directed. TAKE 1 TAB AT HEADACHE ONSET MAY TAKE A 2ND DOSE AFTER 2 HOURS IF NEEDED NO MORE THAN 2 DOSES/24 HRS 04/25/17   [provider]  sertraline (ZOLOFT) 100 MG tablet Take 150 mg by mouth daily. 09/07/21   [provider]  terbinafine (LAMISIL) 250 MG tablet Take 1 tablet (250 mg total) by mouth daily. Patient not taking: Reported on 07/25/2022 11/17/21   Elinor Parkinson, DPM  tiZANidine (ZANAFLEX) 4 MG tablet  11/01/21   [provider]  traZODone (DESYREL) 50 MG tablet Take 50 mg by mouth at bedtime. 08/09/22   [provider]  Trifarotene (AKLIEF) 0.005 % CREA Apply to face nightly as tolerated Patient not taking: Reported on 07/25/2022 01/18/22   Neale Burly, [place], MD  valACYclovir (VALTREX) 500 MG tablet Take 1 tablet (500 mg total) by mouth 2 (two) times daily. 02/09/22   Glenetta Borg, CNM  Vitamin D, Ergocalciferol, (DRISDOL) 1.25 MG (50000  UNIT) CAPS capsule Take by mouth. Patient not taking: Reported on 08/22/2022 01/12/16   [provider]    Physical Exam: Vitals:   09/20/22 1423  BP: (!) 126/90  Pulse: 80  Resp: 17  Temp: 98.3 F (36.8 C)  TempSrc: Oral  SpO2: 96%  Weight: 77.1 kg  Height: 5\' 3"  (1.6 m)   General - Young Caucasian female, no apparent distress HEENT - PERRLA, EOMI, atraumatic head, non tender sinuses. Lung - Clear, rales, rhonchi, wheezes. Heart - S1, S2 heard, no murmurs, rubs, no pedal edema. Abdomen - soft non tender, right flank pain noted Neuro - Alert, awake and oriented x 3, non focal exam. Skin - Warm and dry. Data Reviewed:     Latest Ref Rng & Units 09/20/2022    4:03 PM 09/17/2022   12:49 AM 07/22/2022    3:35 PM  CBC  WBC 4.0 - 10.5 K/uL 8.0  9.8  6.2   Hemoglobin 12.0 - 15.0 g/dL 10.9  32.3  55.7   Hematocrit 36.0 - 46.0 % 37.3  41.5  38.2   Platelets 150 - 400 K/uL 231  287  232       Latest Ref Rng & Units 09/20/2022    4:03 PM 09/17/2022   12:49 AM 07/22/2022    3:35 PM  BMP  Glucose 70 - 99 mg/dL 322  025  427   BUN 6 - 20 mg/dL 12  13  7    Creatinine 0.44 - 1.00 mg/dL 0.62  3.76  2.83   Sodium 135 - 145 mmol/L 136  139  137   Potassium 3.5 - 5.1 mmol/L 4.0  3.9  3.5   Chloride 98 - 111 mmol/L 101  103  102   CO2 22 - 32 mmol/L 25  26  25    Calcium 8.9 - 10.3 mg/dL 9.1  9.3  9.0    CT-scan of the abdomen from 09/17/2022-no acute intra abdominal pathology noted.  Assessment and Plan: 27 year old young female with prior history of ESBL UTI, interstitial cystitis, recurrent urine infections since February of this year presented to the emergency department after she failed outpatient antibiotic therapy, cultures grew ESBL.  The patient does not have fever, white count she is symptomatic for UTI with positive cultures and she will need Carbapenem therapy, ID evaluation and possible neurology evaluation for further management.  Plan: ESBL E. Coli UTI: Admit the  patient to medicine, not telemetry service. She does  have multiple episodes of UTI, history of ESBL. Patient will be started on IV meropenem therapy per pharmacy protocol.  She did get a dose of meropenem in ED. Continue pain control. IV Zofran as needed for nausea. ID evaluation and urology evaluation will be called tomorrow.  Asthma: No exacerbation. Continue bronchodilators as needed. Home dose Singulair.  ADHD: Continue Adderall therapy.  Anxiety and depression Zoloft, BuSpar ordered.  Vitamin B12 deficiency: She is on B12 injections monthly. Ordered 1 dose as she passed due to days.  DVT prophylaxis subcu Lovenox. Out of bed to chair.    Advance Care Planning:   Code Status: Full Code discussed with patient  Consults: none at present  Family Communication: Patient's grandmother at bedside updated regarding current care plan.  Severity of Illness: The appropriate patient status for this patient is OBSERVATION. Observation status is judged to be reasonable and necessary in order to provide the required intensity of service to ensure the patient's safety. The patient's presenting symptoms, physical exam findings, and initial radiographic and laboratory data in the context of their medical condition is felt to place them at decreased risk for further clinical deterioration. Furthermore, it is anticipated that the patient will be medically stable for discharge from the hospital within 2 midnights of admission.   Author: Marcelino Duster, MD 09/20/2022 5:11 PM  For on call review [email or web address].

## 2022-09-21 DIAGNOSIS — F908 Attention-deficit hyperactivity disorder, other type: Secondary | ICD-10-CM | POA: Diagnosis not present

## 2022-09-21 DIAGNOSIS — Z1612 Extended spectrum beta lactamase (ESBL) resistance: Secondary | ICD-10-CM | POA: Diagnosis not present

## 2022-09-21 DIAGNOSIS — F419 Anxiety disorder, unspecified: Secondary | ICD-10-CM | POA: Diagnosis not present

## 2022-09-21 DIAGNOSIS — A499 Bacterial infection, unspecified: Secondary | ICD-10-CM | POA: Diagnosis not present

## 2022-09-21 DIAGNOSIS — N390 Urinary tract infection, site not specified: Secondary | ICD-10-CM | POA: Diagnosis not present

## 2022-09-21 LAB — HIV ANTIBODY (ROUTINE TESTING W REFLEX): HIV Screen 4th Generation wRfx: NONREACTIVE

## 2022-09-21 MED ORDER — KETOROLAC TROMETHAMINE 15 MG/ML IJ SOLN
15.0000 mg | Freq: Once | INTRAMUSCULAR | Status: AC
  Administered 2022-09-21: 15 mg via INTRAVENOUS
  Filled 2022-09-21: qty 1

## 2022-09-21 MED ORDER — LORAZEPAM 0.5 MG PO TABS
0.5000 mg | ORAL_TABLET | Freq: Four times a day (QID) | ORAL | Status: DC | PRN
  Administered 2022-09-21 – 2022-09-23 (×5): 0.5 mg via ORAL
  Filled 2022-09-21 (×5): qty 1

## 2022-09-21 MED ORDER — TRAMADOL HCL 50 MG PO TABS
50.0000 mg | ORAL_TABLET | Freq: Once | ORAL | Status: AC
  Administered 2022-09-21: 50 mg via ORAL
  Filled 2022-09-21: qty 1

## 2022-09-21 MED ORDER — SODIUM CHLORIDE 0.9 % IV BOLUS
250.0000 mL | Freq: Once | INTRAVENOUS | Status: AC
  Administered 2022-09-21: 250 mL via INTRAVENOUS

## 2022-09-21 MED ORDER — ACETAMINOPHEN 500 MG PO TABS
1000.0000 mg | ORAL_TABLET | Freq: Four times a day (QID) | ORAL | Status: DC | PRN
  Administered 2022-09-21 – 2022-09-23 (×3): 1000 mg via ORAL
  Filled 2022-09-21 (×3): qty 2

## 2022-09-21 MED ORDER — MORPHINE SULFATE (PF) 4 MG/ML IV SOLN
4.0000 mg | INTRAVENOUS | Status: DC | PRN
  Administered 2022-09-21: 4 mg via INTRAVENOUS
  Filled 2022-09-21 (×2): qty 1

## 2022-09-21 NOTE — Consult Note (Signed)
[facility]  Total days of antibiotics 1 Reason for Consult:pyelonephritis     Referring Physician: sreeram  Principal Problem:   ESBL (extended spectrum beta-lactamase) producing bacteria infection    HPI: Angela Rocha is a 27 y.o. female hx of recurrent uti, had esbl in 2019, now with dysuria, flank pain, rigors. Had been on macrobid x 10 days without improvement. Her urine cx showing esbl ecoli. Asked to return to hospital for admission.   Past Medical History:  Diagnosis Date   ADHD    Anxiety    Asthma    B12 deficiency    Femoral neck stress fracture    GERD (gastroesophageal reflux disease)    Hip dysplasia    History of dysplastic nevus 03/04/2020   right upper back, severe/excision   Migraine    Pelvic congestion syndrome    Pyelonephritis    Recurrent upper respiratory infection (URI)    Urticaria     Allergies:  Allergies  Allergen Reactions   Doxycycline Nausea And Vomiting    nausea   Amoxicillin-Pot Clavulanate Rash and Other (See Comments)    Has patient had a PCN reaction causing immediate rash, facial/tongue/throat swelling, SOB or lightheadedness with hypotension: Yes Has patient had a PCN reaction causing severe rash involving mucus membranes or skin necrosis: No Has patient had a PCN reaction that required hospitalization: No Has patient had a PCN reaction occurring within the last 10 years: No If all of the above answers are "NO", then may proceed with Cephalosporin use.    Penicillins Rash    Current antibiotics:   MEDICATIONS:  busPIRone  15 mg Oral BID   enoxaparin (LOVENOX) injection  40 mg Subcutaneous Q24H   gabapentin  400 mg Oral BID   loratadine  10 mg Oral QHS   montelukast  10 mg Oral QHS   sertraline  150 mg Oral QHS   tiZANidine  4 mg Oral QHS    Social History   Tobacco Use   Smoking status: Never    Passive exposure: Never   Smokeless tobacco: Never  Vaping Use   Vaping status: Never  Used  Substance Use Topics   Alcohol use: Yes    Alcohol/week: 0.0 standard drinks of alcohol    Comment: occas   Drug use: No    Family History  Problem Relation Age of Onset   Diabetes Mellitus II Father    Colon cancer Father    Lung cancer Paternal Grandfather     Review of Systems - 12 point ros is negative except what is mentioend aboved   OBJECTIVE: Temp:  [97.3 F (36.3 C)-98.3 F (36.8 C)] 98.2 F (36.8 C) (09/11 1154) Pulse Rate:  [61-84] 61 (09/11 1154) Resp:  [18-20] 20 (09/11 1154) BP: (91-109)/(58-69) 107/69 (09/11 1154) SpO2:  [100 %] 100 % (09/11 1154) Physical Exam  Constitutional:  oriented to person, place, and time. appears well-developed and well-nourished. No distress.  HENT: [place]/AT, PERRLA, no scleral icterus Mouth/Throat: Oropharynx is clear and moist. No oropharyngeal exudate.  Cardiovascular: Normal rate, regular rhythm and normal heart sounds. Exam reveals no gallop and no friction rub.  No murmur heard.  Pulmonary/Chest: Effort normal and breath sounds normal. No respiratory distress.  has no wheezes.  Neck = supple, no nuchal rigidity Abdominal: Soft. Bowel sounds are normal.  exhibits no distension. There is no tenderness.  Back = flank pain to right side Lymphadenopathy: no cervical adenopathy. No axillary adenopathy Neurological: alert  and oriented to person, place, and time.  Skin: Skin is warm and dry. No rash noted. No erythema.  Psychiatric: a normal mood and affect.  behavior is normal.    LABS: Results for orders placed or performed during the hospital encounter of 09/20/22 (from the past 48 hour(s))  CBC     Status: Abnormal   Collection Time: 09/20/22  4:03 PM  Result Value Ref Range   WBC 8.0 4.0 - 10.5 K/uL   RBC 4.04 3.87 - 5.11 MIL/uL   Hemoglobin 11.9 (L) 12.0 - 15.0 g/dL   HCT 16.1 09.6 - 04.5 %   MCV 92.3 80.0 - 100.0 fL   MCH 29.5 26.0 - 34.0 pg   MCHC 31.9 30.0 - 36.0 g/dL   RDW 40.9 81.1 - 91.4 %   Platelets 231  150 - 400 K/uL   nRBC 0.0 0.0 - 0.2 %    Comment: Performed at [facility], [address] [place], [place] [address]  Basic metabolic panel     Status: Abnormal   Collection Time: 09/20/22  4:03 PM  Result Value Ref Range   Sodium 136 135 - 145 mmol/L   Potassium 4.0 3.5 - 5.1 mmol/L    Comment: HEMOLYSIS AT THIS LEVEL MAY AFFECT RESULT   Chloride 101 98 - 111 mmol/L   CO2 25 22 - 32 mmol/L   Glucose, Bld 106 (H) 70 - 99 mg/dL    Comment: Glucose reference range applies only to samples taken after fasting for at least 8 hours.   BUN 12 6 - 20 mg/dL   Creatinine, Ser 6.21 0.44 - 1.00 mg/dL   Calcium 9.1 8.9 - 30.8 mg/dL   GFR, Estimated >65 >78 mL/min    Comment: (NOTE) Calculated using the CKD-EPI Creatinine Equation (2021)    Anion gap 10 5 - 15    Comment: Performed at [facility], [address] [place], [place] [address]  Lactic acid, plasma     Status: None   Collection Time: 09/20/22  4:03 PM  Result Value Ref Range   Lactic Acid, Venous 0.6 0.5 - 1.9 mmol/L    Comment: Performed at [facility], [address] [place], [place] [address]  Blood culture (single)     Status: None (Preliminary result)   Collection Time: 09/20/22  4:03 PM   Specimen: BLOOD  Result Value Ref Range   Specimen Description BLOOD BLOOD LEFT ARM    Special Requests      BOTTLES DRAWN AEROBIC AND ANAEROBIC Blood Culture adequate volume   Culture      NO GROWTH < 24 HOURS Performed at [facility], [address], [place], [place] [address]    Report Status PENDING   Lactic acid, plasma     Status: None   Collection Time: 09/20/22  9:15 PM  Result Value Ref Range   Lactic Acid, Venous 0.8 0.5 - 1.9 mmol/L    Comment: Performed at [facility], [address], [place], [place] [address]    MICRO:    Component 4 d ago  Specimen Description URINE, CLEAN CATCH Performed at [facility], [address], [place], [place] [address]   Special Requests NONE Performed at [facility], [address], [place], [place] [address]  Culture  Abnormal  >=100,000 COLONIES/mL ESCHERICHIA COLI Confirmed Extended Spectrum Beta-Lactamase Producer (ESBL).  In bloodstream infections from ESBL organisms, carbapenems are preferred over piperacillin/tazobactam. They are shown to have a lower  risk of mortality.   Report Status 09/19/2022 FINAL  Organism ID, Bacteria ESCHERICHIA COLI Abnormal   Resulting Agency [facility]     Susceptibility   Escherichia coli    MIC    AMPICILLIN >=32 RESIST... Resistant    AMPICILLIN/SULBACTAM 4 SENSITIVE Sensitive    CEFAZOLIN >=64 RESIST... Resistant    CEFEPIME 16 RESISTANT Resistant    CEFTRIAXONE >=64 RESIST... Resistant    CIPROFLOXACIN >=4 RESISTANT Resistant    GENTAMICIN <=1 SENSITIVE Sensitive    IMIPENEM <=0.25 SENS... Sensitive    NITROFURANTOIN <=16 SENSIT... Sensitive    PIP/TAZO <=4 SENSITIVE Sensitive    TRIMETH/SULFA >=320 RESIS... Resistant           Susceptibility Comments      IMAGING: No results found.  HISTORICAL MICRO/IMAGING  Assessment/Plan:  27yo F with hx of recurrent uti, now with pyelonephritis by history, not on correct abtx, admitted on meropenem - would continue on meropenem for next 48hrs then can switch to once a day ertapenem - will watch blood cx to see if also bacteremic - if blood cx ngtd at 48hr then place picc line for plan of 7 days of treatment - hospitalist arranged for urology followup next week which is perfect  Unclear why she has recurrent uti " 8 since the springtime per patient ' needs urodynamics.  Duke Salvia Drue Second MD MPH [facility] [phone]

## 2022-09-21 NOTE — Progress Notes (Signed)
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  The patient does not have fever, white count she is symptomatic for UTI with positive cultures and she will need Carbapenem therapy, ID evaluation and possible neurology evaluation for further management.   Assessment and Plan: ESBL E. Coli UTI: Continue IV meropenem therapy per pharmacy protocol.   ID evaluation appreciated- continue on meropenem for next 48hrs then can switch to once a day ertapenem. Follow blood cultures. Continue pain control. IV Zofran as needed for nausea. Urology evaluation as outpatient arranged for next week.   Asthma: No exacerbation. Continue bronchodilators as needed. Home dose Singulair.   ADHD: Continue Adderall therapy.   Anxiety and depression Zoloft, BuSpar ordered.   Vitamin B12 deficiency: She is on B12 injections monthly. Ordered 1 dose as she passed due to days.   DVT prophylaxis subcu Lovenox. Out of bed to chair.     Subjective: Patient is seen and examined today morning. She is anxious, does report dysuria, lower abdominal pain. Asks for pain meds, IV zofran. Significant other at bedside. She has nausea and vomited x 2 since last night.  Physical Exam: Vitals:   09/20/22 2316 09/21/22 0337 09/21/22 0735 09/21/22 1154  BP: 96/62 (!) 91/58 94/61 107/69  Pulse: 75 63 67 61  Resp: 18  18 20   Temp: (!) 97.3 F (36.3 C) 97.8 F (36.6 C) 97.9 F (36.6 C) 98.2 F (36.8 C)  TempSrc:  Oral    SpO2: 100% 100% 100% 100%  Weight:      Height:       General - Young Caucasian female, in distress due to pain,  nausea. HEENT - PERRLA, EOMI, atraumatic head, non tender sinuses. Lung - Clear, rales, rhonchi, wheezes. Heart - S1, S2 heard, no murmurs, rubs, no pedal edema. Abdomen - soft non tender, lower abdominal and right flank tender. Neuro - Alert, awake and oriented x 3, anxious, non focal exam. Skin - Warm and dry. Data Reviewed:     Latest Ref Rng & Units 09/20/2022    4:03 PM 09/17/2022   12:49 AM 07/22/2022    3:35 PM  CBC  WBC 4.0 - 10.5 K/uL 8.0  9.8  6.2   Hemoglobin 12.0 - 15.0 g/dL 32.2  02.5  42.7   Hematocrit 36.0 - 46.0 % 37.3  41.5  38.2   Platelets 150 - 400 K/uL 231  287  232       Latest Ref Rng & Units 09/20/2022    4:03 PM 09/17/2022   12:49 AM 07/22/2022    3:35 PM  BMP  Glucose 70 - 99 mg/dL 062  376  283   BUN 6 - 20 mg/dL 12  13  7    Creatinine 0.44 - 1.00 mg/dL 1.51  7.61  6.07   Sodium 135 - 145 mmol/L 136  139  137   Potassium 3.5 - 5.1 mmol/L 4.0  3.9  3.5   Chloride 98 - 111 mmol/L 101  103  102   CO2 22 - 32 mmol/L 25  26  25    Calcium 8.9 - 10.3 mg/dL 9.1  9.3  9.0  No results found.   Family Communication: Significant other at bedside, understand and agree with discharge plan  Disposition: Status is: Inpatient Remains inpatient appropriate because: IV meropenem therapy for ESBL infection.  Planned Discharge Destination: Home    Time spent: 40 minutes  Author: Marcelino Duster, MD 09/21/2022 4:31 PM  For on call review [email or web address].

## 2022-09-21 NOTE — TOC Progression Note (Signed)
Transition of Care [facility]) - Progression Note    Patient Details  Name: Angela Rocha MRN: [id number] Date of Birth: 05/22/1995  Transition of Care [facility]) CM/SW Contact  Darolyn Rua, [place] Phone Number: 09/21/2022, 1:18 PM  Clinical Narrative:     Per Jeri Modena she has received referral for IV abx and has set patient up with [facility] with anticipated discharge tomorrow, no additional needs at this time.        Expected Discharge Plan and Services                                               Social Determinants of Health (SDOH) Interventions SDOH Screenings   Food Insecurity: No Food Insecurity (09/20/2022)  Housing: Low Risk  (09/20/2022)  Transportation Needs: No Transportation Needs (09/20/2022)  Utilities: Not At Risk (09/20/2022)  Financial Resource Strain: Patient Declined (08/04/2022)   Received from [facility]  Tobacco Use: Low Risk  (09/20/2022)    Readmission Risk Interventions     No data to display

## 2022-09-21 NOTE — Plan of Care (Signed)

## 2022-09-22 DIAGNOSIS — A499 Bacterial infection, unspecified: Secondary | ICD-10-CM | POA: Diagnosis not present

## 2022-09-22 DIAGNOSIS — N390 Urinary tract infection, site not specified: Secondary | ICD-10-CM | POA: Diagnosis not present

## 2022-09-22 DIAGNOSIS — F419 Anxiety disorder, unspecified: Secondary | ICD-10-CM | POA: Diagnosis not present

## 2022-09-22 DIAGNOSIS — F908 Attention-deficit hyperactivity disorder, other type: Secondary | ICD-10-CM | POA: Diagnosis not present

## 2022-09-22 MED ORDER — FLUCONAZOLE 100 MG PO TABS
100.0000 mg | ORAL_TABLET | Freq: Every day | ORAL | Status: DC
  Administered 2022-09-22 – 2022-09-23 (×2): 100 mg via ORAL
  Filled 2022-09-22 (×2): qty 1

## 2022-09-22 MED ORDER — SODIUM CHLORIDE 0.9 % IV SOLN
1.0000 g | INTRAVENOUS | Status: DC
  Administered 2022-09-23: 1 g via INTRAVENOUS
  Filled 2022-09-22: qty 1000

## 2022-09-22 MED ORDER — SODIUM CHLORIDE 0.9 % IV SOLN: INTRAVENOUS | Status: DC

## 2022-09-22 MED ORDER — BISACODYL 10 MG RE SUPP
10.0000 mg | Freq: Once | RECTAL | Status: AC
  Administered 2022-09-22: 10 mg via RECTAL
  Filled 2022-09-22: qty 1

## 2022-09-22 MED ORDER — TRAMADOL HCL 50 MG PO TABS
50.0000 mg | ORAL_TABLET | Freq: Four times a day (QID) | ORAL | Status: DC | PRN
  Administered 2022-09-22 – 2022-09-23 (×5): 50 mg via ORAL
  Filled 2022-09-22 (×6): qty 1

## 2022-09-22 NOTE — Progress Notes (Signed)
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  The patient does not have fever, white count she is symptomatic for UTI with positive cultures and she will need Carbapenem therapy, ID evaluation and possible neurology evaluation for further management.   Assessment and Plan: ESBL E. Coli UTI: Continue IV meropenem therapy per pharmacy protocol.   ID evaluation appreciated- continue on meropenem for next 48hrs then can switch to once a day ertapenem. Blood culture so far negative. Plan to get PICC line tomorrow if blood cultures remain negative. Continue pain control. IV Zofran as needed for nausea. Urology evaluation as outpatient arranged for next week.   Asthma: No exacerbation. Continue bronchodilators as needed. Home dose Singulair.   ADHD: She wants to hold Adderall therapy for now.   Anxiety and depression Zoloft, BuSpar ordered. Ativan PRN ordered for anxiety.   Vitamin B12 deficiency: She is on B12 injections monthly.   DVT prophylaxis subcu Lovenox. Out of bed to chair.     Subjective: Patient is seen and examined today morning. She did report anxiety which is better with ativan. Significant other at bedside. She has nausea, but able to tolerate diet today. Pain improved with meds.   Physical Exam: Vitals:   09/22/22 0743 09/22/22 0851 09/22/22 1137 09/22/22 1202  BP: (!) 89/53 90/80 111/89 116/77  Pulse: (!) 54  62 (!) 56  Resp:  18 18 18   Temp: 98.5 F (36.9 C)   97.8 F (36.6 C)  TempSrc: Oral   Oral  SpO2: 98%  100% 100%  Weight:      Height:       General - Young Caucasian  female, no distress HEENT - PERRLA, EOMI, atraumatic head, non tender sinuses. Lung - Clear, rales, rhonchi, wheezes. Heart - S1, S2 heard, no murmurs, rubs, no pedal edema. Abdomen - soft non tender, lower abdominal and right flank tender. Neuro - Alert, awake and oriented x 3, non focal exam. Skin - Warm and dry. Data Reviewed:     Latest Ref Rng & Units 09/20/2022    4:03 PM 09/17/2022   12:49 AM 07/22/2022    3:35 PM  CBC  WBC 4.0 - 10.5 K/uL 8.0  9.8  6.2   Hemoglobin 12.0 - 15.0 g/dL 57.8  46.9  62.9   Hematocrit 36.0 - 46.0 % 37.3  41.5  38.2   Platelets 150 - 400 K/uL 231  287  232       Latest Ref Rng & Units 09/20/2022    4:03 PM 09/17/2022   12:49 AM 07/22/2022    3:35 PM  BMP  Glucose 70 - 99 mg/dL 528  413  244   BUN 6 - 20 mg/dL 12  13  7    Creatinine 0.44 - 1.00 mg/dL 0.10  2.72  5.36   Sodium 135 - 145 mmol/L 136  139  137   Potassium 3.5 - 5.1 mmol/L 4.0  3.9  3.5   Chloride 98 - 111 mmol/L 101  103  102   CO2 22 - 32 mmol/L 25  26  25    Calcium 8.9 - 10.3 mg/dL 9.1  9.3  9.0    No results found.   Family Communication: Significant other at bedside, understand and agree with discharge plan  Disposition: Status is: Inpatient Remains inpatient appropriate because: IV meropenem therapy for ESBL infection.  Planned Discharge Destination: Home    Time spent: 42 minutes  Author: Marcelino Duster, MD 09/22/2022 3:59 PM  For on call review [email or web address].

## 2022-09-22 NOTE — Progress Notes (Signed)
PHARMACY CONSULT NOTE FOR:  OUTPATIENT  PARENTERAL ANTIBIOTIC THERAPY (OPAT)  Indication: ESBL E coli UTI Regimen: Ertapenem 1gm IV q24h End date: 9/17/20204  Pull PICC/midline at completion of antibiotics  IV antibiotic discharge orders are pended. To discharging provider:  please sign these orders via discharge navigator,  Select New Orders & click on the button choice - Manage This Unsigned Work.     Thank you for allowing pharmacy to be a part of this patient's care.  Juliette Alcide, PharmD, BCPS, BCIDP Work Cell: [phone] 09/22/2022 2:40 PM

## 2022-09-22 NOTE — Progress Notes (Signed)
Patient refusing to transfer to med/surg floor for fear of her blood pressure dropping and wants to be monitored.  Administration made aware, and on call provider notified.

## 2022-09-23 ENCOUNTER — Other Ambulatory Visit: Payer: Self-pay

## 2022-09-23 DIAGNOSIS — B9620 Unspecified Escherichia coli [E. coli] as the cause of diseases classified elsewhere: Secondary | ICD-10-CM

## 2022-09-23 DIAGNOSIS — G43009 Migraine without aura, not intractable, without status migrainosus: Secondary | ICD-10-CM

## 2022-09-23 DIAGNOSIS — F411 Generalized anxiety disorder: Secondary | ICD-10-CM

## 2022-09-23 DIAGNOSIS — Z1612 Extended spectrum beta lactamase (ESBL) resistance: Secondary | ICD-10-CM | POA: Diagnosis not present

## 2022-09-23 DIAGNOSIS — N390 Urinary tract infection, site not specified: Principal | ICD-10-CM

## 2022-09-23 DIAGNOSIS — F902 Attention-deficit hyperactivity disorder, combined type: Secondary | ICD-10-CM | POA: Diagnosis not present

## 2022-09-23 DIAGNOSIS — A499 Bacterial infection, unspecified: Secondary | ICD-10-CM | POA: Diagnosis not present

## 2022-09-23 DIAGNOSIS — E669 Obesity, unspecified: Secondary | ICD-10-CM

## 2022-09-23 DIAGNOSIS — E611 Iron deficiency: Secondary | ICD-10-CM

## 2022-09-23 MED ORDER — SODIUM CHLORIDE 0.9% FLUSH: 10.0000 mL | INTRAVENOUS | Status: DC | PRN

## 2022-09-23 MED ORDER — SODIUM CHLORIDE 0.9% FLUSH: 10.0000 mL | Freq: Two times a day (BID) | INTRAVENOUS | Status: DC

## 2022-09-23 MED ORDER — ERTAPENEM IV (FOR PTA / DISCHARGE USE ONLY): 1.0000 g | INTRAVENOUS | 0 refills | Status: AC

## 2022-09-23 MED ORDER — LORAZEPAM 0.5 MG PO TABS: 0.5000 mg | ORAL_TABLET | Freq: Four times a day (QID) | ORAL | 0 refills | Status: DC | PRN

## 2022-09-23 MED ORDER — BUTALBITAL-APAP-CAFFEINE 50-325-40 MG PO TABS
2.0000 | ORAL_TABLET | ORAL | Status: DC | PRN
  Administered 2022-09-23: 2 via ORAL
  Filled 2022-09-23: qty 2

## 2022-09-23 NOTE — Progress Notes (Signed)
Subjective:  Suffering from migraine headaches today.  Antibiotics:  Anti-infectives (From admission, onward)    Start     Dose/Rate Route Frequency Ordered Stop   09/24/22 0000  ertapenem [facility]) IVPB        1 g Intravenous Every 24 hours 09/23/22 1505 09/27/22 2359   09/23/22 0800  ertapenem (INVANZ) 1 g in sodium chloride 0.9 % 100 mL IVPB        1 g 200 mL/hr over 30 Minutes Intravenous Every 24 hours 09/22/22 1436 09/28/22 0759   09/22/22 1000  fluconazole (DIFLUCAN) tablet 100 mg        100 mg Oral Daily 09/22/22 0855     09/21/22 0000  meropenem (MERREM) 1 g in sodium chloride 0.9 % 100 mL IVPB        1 g 200 mL/hr over 30 Minutes Intravenous Every 8 hours 09/20/22 2109 09/23/22 0619   09/20/22 1600  meropenem (MERREM) 1 g in sodium chloride 0.9 % 100 mL IVPB        1 g 200 mL/hr over 30 Minutes Intravenous  Once 09/20/22 1546 09/20/22 1715       Medications: Scheduled Meds:  busPIRone  15 mg Oral BID   enoxaparin (LOVENOX) injection  40 mg Subcutaneous Q24H   fluconazole  100 mg Oral Daily   gabapentin  400 mg Oral BID   loratadine  10 mg Oral QHS   montelukast  10 mg Oral QHS   sertraline  150 mg Oral QHS   sodium chloride flush  10-40 mL Intracatheter Q12H   tiZANidine  4 mg Oral QHS   Continuous Infusions:  ertapenem 1 g (09/23/22 0830)   PRN Meds:.albuterol, butalbital-acetaminophen-caffeine, LORazepam, [DISCONTINUED] ondansetron **OR** ondansetron (ZOFRAN) IV, sodium chloride flush, traMADol    Objective: Weight change:   Intake/Output Summary (Last 24 hours) at 09/23/2022 1614 Last data filed at 09/23/2022 0400 Gross per 24 hour  Intake 1281.94 ml  Output --  Net 1281.94 ml   Blood pressure (!) 134/96, pulse 75, temperature (!) 97.2 F (36.2 C), resp. rate 18, height 5\' 3"  (1.6 m), weight 77.1 kg, last menstrual period 08/19/2022, SpO2 100%. Temp:  [97.2 F (36.2 C)-98.4 F (36.9 C)] 97.2 F (36.2 C) (09/13 1612) Pulse Rate:   [75-106] 75 (09/13 1613) Resp:  [18-20] 18 (09/13 1246) BP: (113-139)/(72-96) 134/96 (09/13 1613) SpO2:  [94 %-100 %] 100 % (09/13 1613)  Physical Exam: Physical Exam Constitutional:      General: She is not in acute distress.    Appearance: She is well-developed. She is not diaphoretic.  HENT:     Head: Normocephalic and atraumatic.     Right Ear: External ear normal.     Left Ear: External ear normal.     Mouth/Throat:     Pharynx: No oropharyngeal exudate.  Eyes:     General: No scleral icterus.    Conjunctiva/sclera: Conjunctivae normal.     Pupils: Pupils are equal, round, and reactive to light.  Cardiovascular:     Rate and Rhythm: Normal rate and regular rhythm.  Pulmonary:     Effort: Pulmonary effort is normal. No respiratory distress.     Breath sounds: No wheezing.  Abdominal:     General: Bowel sounds are normal.     Palpations: Abdomen is soft.     Tenderness: There is no abdominal tenderness.  Musculoskeletal:        General: No tenderness. Normal range of motion.  Lymphadenopathy:     Cervical: No cervical adenopathy.  Skin:    General: Skin is warm and dry.     Coloration: Skin is not pale.     Findings: No erythema or rash.  Neurological:     General: No focal deficit present.     Mental Status: She is alert and oriented to person, place, and time.     Motor: No abnormal muscle tone.     Coordination: Coordination normal.  Psychiatric:        Mood and Affect: Mood normal.        Behavior: Behavior normal.        Thought Content: Thought content normal.        Judgment: Judgment normal.      CBC:    BMET No results for input(s): "NA", "K", "CL", "CO2", "GLUCOSE", "BUN", "CREATININE", "CALCIUM" in the last 72 hours.   Liver Panel  No results for input(s): "PROT", "ALBUMIN", "AST", "ALT", "ALKPHOS", "BILITOT", "BILIDIR", "IBILI" in the last 72 hours.     Sedimentation Rate No results for input(s): "ESRSEDRATE" in the last 72  hours. C-Reactive Protein No results for input(s): "CRP" in the last 72 hours.  Micro Results: Recent Results (from the past 720 hour(s))  Urine Culture     Status: Abnormal   Collection Time: 09/17/22  1:43 AM   Specimen: Urine, Clean Catch  Result Value Ref Range Status   Specimen Description   Final    URINE, CLEAN CATCH Performed at [facility], [address] [place], [place] [address]    Special Requests   Final    NONE Performed at [facility], [address] [place], [place] [address]    Culture (A)  Final    >=100,000 COLONIES/mL ESCHERICHIA COLI Confirmed Extended Spectrum Beta-Lactamase Producer (ESBL).  In bloodstream infections from ESBL organisms, carbapenems are preferred over piperacillin/tazobactam. They are shown to have a lower risk of mortality.    Report Status 09/19/2022 FINAL  Final   Organism ID, Bacteria ESCHERICHIA COLI (A)  Final      Susceptibility   Escherichia coli - MIC*    AMPICILLIN >=32 RESISTANT Resistant     CEFAZOLIN >=64 RESISTANT Resistant     CEFEPIME 16 RESISTANT Resistant     CEFTRIAXONE >=64 RESISTANT Resistant     CIPROFLOXACIN >=4 RESISTANT Resistant     GENTAMICIN <=1 SENSITIVE Sensitive     IMIPENEM <=0.25 SENSITIVE Sensitive     NITROFURANTOIN <=16 SENSITIVE Sensitive     TRIMETH/SULFA >=320 RESISTANT Resistant     AMPICILLIN/SULBACTAM 4 SENSITIVE Sensitive     PIP/TAZO <=4 SENSITIVE Sensitive     * >=100,000 COLONIES/mL ESCHERICHIA COLI  Blood culture (single)     Status: None (Preliminary result)   Collection Time: 09/20/22  4:03 PM   Specimen: BLOOD  Result Value Ref Range Status   Specimen Description BLOOD BLOOD LEFT ARM  Final   Special Requests   Final    BOTTLES DRAWN AEROBIC AND ANAEROBIC Blood Culture adequate volume   Culture   Final    NO GROWTH 3 DAYS Performed at [facility], [address] [place], [place] [address]    Report Status PENDING  Incomplete     Studies/Results: [place] EKG SITE RITE  Result Date: 09/23/2022 If Site Rite image not attached, placement could not be confirmed due to current cardiac rhythm.     Assessment/Plan:  INTERVAL HISTORY: pt doing well on invanz   Principal Problem:  ESBL (extended spectrum beta-lactamase) producing bacteria infection Active Problems:   Recurrent UTI    Angela Rocha is a 27 y.o. female with current urinary tract infections over the past 6 to 8 months now with admission with a ESBL E. coli infection  #1 ESBL E coli UTI  Would place midline and completed total of 7 days of carbapenem therapy for this.  She is going to be seeing urology for evaluation next week.  It sounds as if she is had actual infections with symptoms consistent with urinary tract infection.  I counseled her with regards to the pitfalls that occur in modern medicine with regards to missed diagnoses of urinary tract infections and inappropriate collections of urine cultures in asymptomatic patients and patients also to test for "cure"  I have personally spent 52 minutes involved in face-to-face and non-face-to-face activities for this patient on the day of the visit. Professional time spent includes the following activities: Preparing to see the patient (review of tests), Obtaining and/or reviewing separately obtained history (admission/discharge record), Performing a medically appropriate examination and/or evaluation , Ordering medications/tests/procedures, referring and communicating with other health care professionals, Documenting clinical information in the EMR, Independently interpreting results (not separately reported), Communicating results to the patient/family/caregiver, Counseling and educating the patient/family/caregiver and Care coordination (not separately reported).     LOS: 3 days   Acey Lav 09/23/2022, 4:14 PM

## 2022-09-23 NOTE — Plan of Care (Signed)
Problem: Education: Goal: Knowledge of General Education information will improve Description: Including pain rating scale, medication(s)/side effects and non-pharmacologic comfort measures Outcome: Progressing   Problem: Clinical Measurements: Goal: Will remain free from infection Outcome: Progressing   Problem: Clinical Measurements: Goal: Respiratory complications will improve Outcome: Progressing   Problem: Clinical Measurements: Goal: Cardiovascular complication will be avoided Outcome: Progressing   Problem: Activity: Goal: Risk for activity intolerance will decrease Outcome: Progressing   Problem: Nutrition: Goal: Adequate nutrition will be maintained Outcome: Progressing   Problem: Elimination: Goal: Will not experience complications related to bowel motility Outcome: Progressing   Problem: Elimination: Goal: Will not experience complications related to urinary retention Outcome: Progressing   Problem: Pain Managment: Goal: General experience of comfort will improve Outcome: Progressing

## 2022-09-23 NOTE — Plan of Care (Signed)
Care plan complete

## 2022-09-23 NOTE — Discharge Summary (Signed)
Physician Discharge Summary   Patient: Angela Rocha MRN: [id number] DOB: [birth year]  Admit date:     09/20/2022  Discharge date: 09/23/22  Discharge Physician: Marcelino Duster   PCP: Carren Rang, PA-C   Recommendations at discharge:    PCP follow up in 1 week Urology follow up as scheduled.  Discharge Diagnoses: Principal Problem:   ESBL (extended spectrum beta-lactamase) producing bacteria infection Active Problems:   Attention deficit hyperactivity disorder (ADHD), combined type   Generalized anxiety disorder   Migraine without aura and without status migrainosus, not intractable   Obesity (BMI 30-39.9)   Iron deficiency   Recurrent UTI  Resolved Problems:   * No resolved hospital problems. *  Hospital Course: Angela Rocha is a 27 year old young female with prior history of ESBL UTI, interstitial cystitis, recurrent urine infections since February of this year presented to the emergency department after she failed outpatient antibiotic therapy, cultures grew ESBL.  The patient does not have fever, white count she is has dysuria, lower abdominal and right flank pain, ESBL UTI will need Carbapenem therapy, ID evaluation and possible neurology evaluation for further management admitted to hospitalist service. During the hospital stay, she is given Meropenem, ID evaluated her, advised to change to Ertapenem with midline placement as blood cultures negative for 3 days. OPAT/ HH services arranged for Ertapenem 4 more days. Her symptoms did improve she is hemodynamically stable to be discharged home. Patient and her significant other at bedside understand and agree with discharge plan. Return to school note given. Ativan PRN script printed. She is hemodynamically stable to be discharged home.   Assessment and Plan: ESBL E. Coli UTI: IV meropenem therapy changed to Ertapenem daily for 7 days per pharmacy protocol.   ID evaluation appreciated- Blood culture so far  negative. Midline placed today. OPAT evaluated for oupatient antibiotics. Urology evaluation as outpatient arranged for next week.   Asthma: No exacerbation. Continue bronchodilators as needed. Home dose Singulair.   ADHD: Adderall therapy as outpatient.   Anxiety and depression Zoloft, BuSpar ordered. Ativan 0.5mg  Q6h PRN for anxiety script given for 10 tab.  Vitamin B12 deficiency: B12 injections monthly as per PCP.        Consultants: ID Procedures performed: Midline insertion  Disposition: Home Diet recommendation:  Discharge Diet Orders (From admission, onward)     Start     Ordered   09/23/22 0000  Diet - low sodium heart healthy        09/23/22 1611           Regular diet DISCHARGE MEDICATION: Allergies as of 09/23/2022       Reactions   Doxycycline Nausea And Vomiting   nausea   Amoxicillin-pot Clavulanate Rash, Other (See Comments)   Has patient had a PCN reaction causing immediate rash, facial/tongue/throat swelling, SOB or lightheadedness with hypotension: Yes Has patient had a PCN reaction causing severe rash involving mucus membranes or skin necrosis: No Has patient had a PCN reaction that required hospitalization: No Has patient had a PCN reaction occurring within the last 10 years: No If all of the above answers are "NO", then may proceed with Cephalosporin use.   Penicillins Rash        Medication List     STOP taking these medications    cefpodoxime 200 MG tablet Commonly known as: VANTIN       TAKE these medications    albuterol 108 (90 Base) MCG/ACT inhaler Commonly known as: VENTOLIN HFA Inhale 2 puffs into  the lungs every 6 (six) hours as needed for wheezing or shortness of breath.   amphetamine-dextroamphetamine 25 MG 24 hr capsule Commonly known as: ADDERALL XR Take 25 mg by mouth daily.   busPIRone 15 MG tablet Commonly known as: BUSPAR Take 15 mg by mouth 2 (two) times daily.   cetirizine 10 MG tablet Commonly  known as: ZYRTEC Take 10 mg by mouth daily.   clobetasol 0.05 % external solution Commonly known as: TEMOVATE Apply to aa's scalp QD PRN. Avoid the face, groin, and underarms.   cyanocobalamin 1000 MCG/ML injection Commonly known as: VITAMIN B12 INJECT 1 ML (1,000 MCG TOTAL) INTO THE MUSCLE EVERY 30 (THIRTY) DAYS.   Emgality 120 MG/ML Soaj Generic drug: Galcanezumab-gnlm Inject into the skin.   ertapenem IVPB Commonly known as: INVANZ Inject 1 g into the vein daily for 3 days. Indication:  ESBL E coli UTI First Dose: Yes Last Day of Therapy:  09/27/2022 Labs - Once weekly:  CBC/D and BMP Pull midline/PICC at completion of antibiotics Method of administration: Mini-Bag Plus / Gravity Method of administration may be changed at the discretion of home infusion pharmacist based upon assessment of the patient and/or caregiver's ability to self-administer the medication ordered. Start taking on: September 24, 2022   fluticasone 50 MCG/ACT nasal spray Commonly known as: Flonase Place 1 spray into both nostrils 2 (two) times daily as needed for allergies.   gabapentin 400 MG capsule Commonly known as: NEURONTIN Take 400 mg by mouth 2 (two) times daily.   levonorgestrel 20 MCG/24HR IUD Commonly known as: MIRENA by Intrauterine route.   LORazepam 0.5 MG tablet Commonly known as: ATIVAN Take 1 tablet (0.5 mg total) by mouth every 6 (six) hours as needed for anxiety.   montelukast 10 MG tablet Commonly known as: SINGULAIR Take 10 mg by mouth at bedtime.   ondansetron 4 MG disintegrating tablet Commonly known as: ZOFRAN-ODT Allow 1-2 tablets to dissolve in your mouth every 8 hours as needed for nausea/vomiting   propranolol 10 MG tablet Commonly known as: INDERAL Take 10 mg by mouth daily.   Qelbree 200 MG 24 hr capsule Generic drug: viloxazine ER Take 200 mg by mouth daily.   rizatriptan 10 MG disintegrating tablet Commonly known as: MAXALT-MLT Take 10 mg by mouth as  directed. TAKE 1 TAB AT HEADACHE ONSET MAY TAKE A 2ND DOSE AFTER 2 HOURS IF NEEDED NO MORE THAN 2 DOSES/24 HRS   sertraline 100 MG tablet Commonly known as: ZOLOFT Take 150 mg by mouth daily.   tiZANidine 4 MG tablet Commonly known as: ZANAFLEX Take 4 mg by mouth at bedtime.   traZODone 50 MG tablet Commonly known as: DESYREL Take 50 mg by mouth at bedtime.   valACYclovir 500 MG tablet Commonly known as: VALTREX Take 1 tablet (500 mg total) by mouth 2 (two) times daily. What changed:  when to take this reasons to take this               Discharge Care Instructions  (From admission, onward)           Start     Ordered   09/23/22 0000  Change dressing on IV access line weekly and PRN  (Home infusion instructions - [facility] )        09/23/22 1505   09/23/22 0000  Discharge wound care:       Comments: Line care per Midline team as instructed.   09/23/22 1611  Follow-up Information     Vanna Scotland, MD. Schedule an appointment as soon as possible for a visit in 2 week(s).   Specialty: Urology Contact information: [address] [place] [address] [phone]                Discharge Exam: Ceasar Mons Weights   09/20/22 1423  Weight: 77.1 kg   General - Young Caucasian female, no distress HEENT - PERRLA, EOMI, atraumatic head, non tender sinuses. Lung - Clear, rales, rhonchi, wheezes. Heart - S1, S2 heard, no murmurs, rubs, no pedal edema. Abdomen - soft non tender, lower abdominal and right flank tender. Neuro - Alert, awake and oriented x 3, non focal exam. Skin - Warm and dry.  Condition at discharge: stable  The results of significant diagnostics from this hospitalization (including imaging, microbiology, ancillary and laboratory) are listed below for reference.   Imaging Studies: [place] EKG SITE RITE  Result Date: 09/23/2022 If Site Rite image not attached, placement could not be confirmed due  to current cardiac rhythm.  CT ABDOMEN PELVIS W CONTRAST  Result Date: 09/17/2022 CLINICAL DATA:  27 year old female with history of urinary pain for the last week. Prior history of kidney infections. EXAM: CT ABDOMEN AND PELVIS WITH CONTRAST TECHNIQUE: Multidetector CT imaging of the abdomen and pelvis was performed using the standard protocol following bolus administration of intravenous contrast. RADIATION DOSE REDUCTION: This exam was performed according to the departmental dose-optimization program which includes automated exposure control, adjustment of the mA and/or kV according to patient size and/or use of iterative reconstruction technique. CONTRAST:  OMNIPAQUE IOHEXOL 300 MG/ML  SOLN COMPARISON:  CT of the abdomen and pelvis 07/22/2022. FINDINGS: Lower chest: Unremarkable. Hepatobiliary: No suspicious cystic or solid hepatic lesions. No intra or extrahepatic biliary ductal dilatation. Gallbladder is not visualized, presumably surgically absent (no surgical clips noted in the gallbladder fossa). Pancreas: No pancreatic mass. No pancreatic ductal dilatation. No pancreatic or peripancreatic fluid collections or inflammatory changes. Spleen: Unremarkable. Adrenals/Urinary Tract: Bilateral kidneys and bilateral adrenal glands are normal in appearance. No hydroureteronephrosis. Urinary bladder is completely decompressed, but otherwise unremarkable in appearance. Stomach/Bowel: The appearance of the stomach is normal. No pathologic dilatation of small bowel or colon. Normal appendix. Vascular/Lymphatic: No significant atherosclerotic disease, aneurysm or dissection noted in the abdominal or pelvic vasculature. No lymphadenopathy noted in the abdomen or pelvis. Reproductive: IUD present in the uterus, which appears appropriately located. Uterus and ovaries are otherwise unremarkable in appearance. Other: No significant volume of ascites.  No pneumoperitoneum. Musculoskeletal: There are no aggressive  appearing lytic or blastic lesions noted in the visualized portions of the skeleton. IMPRESSION: 1. No acute findings are noted in the abdomen or pelvis to account for the patient's symptoms. Electronically Signed   By: Trudie Reed M.D.   On: 09/17/2022 05:26    Microbiology: Results for orders placed or performed during the hospital encounter of 09/20/22  Blood culture (single)     Status: None (Preliminary result)   Collection Time: 09/20/22  4:03 PM   Specimen: BLOOD  Result Value Ref Range Status   Specimen Description BLOOD BLOOD LEFT ARM  Final   Special Requests   Final    BOTTLES DRAWN AEROBIC AND ANAEROBIC Blood Culture adequate volume   Culture   Final    NO GROWTH 3 DAYS Performed at [facility], [address], [place], [place] [address]    Report Status PENDING  Incomplete    Labs: CBC: Recent  Labs  Lab 09/17/22 0049 09/20/22 1603  WBC 9.8 8.0  HGB 13.7 11.9*  HCT 41.5 37.3  MCV 89.2 92.3  PLT 287 231   Basic Metabolic Panel: Recent Labs  Lab 09/17/22 0049 09/20/22 1603  NA 139 136  K 3.9 4.0  CL 103 101  CO2 26 25  GLUCOSE 116* 106*  BUN 13 12  CREATININE 0.82 0.84  CALCIUM 9.3 9.1   Liver Function Tests: No results for input(s): "AST", "ALT", "ALKPHOS", "BILITOT", "PROT", "ALBUMIN" in the last 168 hours. CBG: No results for input(s): "GLUCAP" in the last 168 hours.  Discharge time spent: 39 minutes.  Signed: Marcelino Duster, MD [facility] 09/23/2022

## 2022-09-25 LAB — CULTURE, BLOOD (SINGLE)
Culture: NO GROWTH
Special Requests: ADEQUATE

## 2022-09-29 ENCOUNTER — Ambulatory Visit (INDEPENDENT_AMBULATORY_CARE_PROVIDER_SITE_OTHER): Payer: BC Managed Care – PPO | Admitting: Urology

## 2022-09-29 ENCOUNTER — Encounter: Payer: Self-pay | Admitting: Urology

## 2022-09-29 VITALS — BP 143/90 | HR 114 | Ht 63.0 in | Wt 171.0 lb

## 2022-09-29 DIAGNOSIS — Z09 Encounter for follow-up examination after completed treatment for conditions other than malignant neoplasm: Secondary | ICD-10-CM | POA: Diagnosis not present

## 2022-09-29 DIAGNOSIS — N390 Urinary tract infection, site not specified: Secondary | ICD-10-CM

## 2022-09-29 LAB — MICROSCOPIC EXAMINATION: Epithelial Cells (non renal): >10 /hpf — AB (ref 0–10)

## 2022-09-29 LAB — URINALYSIS, COMPLETE
Bilirubin, UA: NEGATIVE
Glucose, UA: NEGATIVE
Ketones, UA: NEGATIVE
Leukocytes,UA: NEGATIVE
Nitrite, UA: NEGATIVE
Protein,UA: NEGATIVE
Specific Gravity, UA: 1.02 (ref 1.005–1.030)
Urobilinogen, Ur: 0.2 mg/dL (ref 0.2–1.0)
pH, UA: 7 (ref 5.0–7.5)

## 2022-09-29 MED ORDER — NITROFURANTOIN MACROCRYSTAL 50 MG PO CAPS: 50.0000 mg | ORAL_CAPSULE | Freq: Every day | ORAL | 1 refills | Status: DC

## 2022-09-29 NOTE — Progress Notes (Signed)
I,Amy L Pierron,acting as a scribe for Angela Altes, MD.,have documented all relevant documentation on the behalf of Angela Altes, MD,as directed by  Angela Altes, MD while in the presence of Angela Altes, MD.  09/29/2022 12:29 PM   Clent Demark 29-Sep-1995 [id number]  Referring provider: Carren Rang, PA-C [address] [place],  [place] [address]  Chief Complaint  Patient presents with   Recurrent UTI    HPI: Angela Rocha is a 27 y.o. female who presents for further evaluation of recurrent UTI's.   States she has been treated for 10-11 UTI's since February 2024.  On a record review, she has had urinalysis which have shown pyuria and either a culture was not ordered or she had cultures grow multiple species/ mixed flora.  She presented to the ED on 09/17/2022 c/o severe pelvic pain radiating to the right flank after treated with a 10 day course of Macrobid. She received IV Recephin and a CT of the abdomen and pelvis. It showed no abnormalities. Urine culture subsequently grew ESBL E coli. She was called back to the ED on 09/20/2022 and admitted for IV antibiotics. She was discharged with PICC line and recently completed a course of Carbapenem. Her symptoms consist of dysuria, frequency, and pelvic pain. She has had, including her most recent episode, one other episode where she developed flank pain. Previously hospitalized May 2019 for E. coli UTI with flank pain and fever. She states she will have intermittent periods of recurrent UTIs, then will be infection-free for several years and symptoms recur. Has been followed by rheumatology for possible autoimmune disease.  Since completing her antibiotic course, she has mild urinary frequency but is otherwise asymptomatic. She has a personal history of UTI as an infant.   PMH: Past Medical History:  Diagnosis Date   ADHD    Anxiety    Asthma    B12 deficiency    Femoral neck stress fracture     GERD (gastroesophageal reflux disease)    Hip dysplasia    History of dysplastic nevus 03/04/2020   right upper back, severe/excision   Migraine    Pelvic congestion syndrome    Pyelonephritis    Recurrent upper respiratory infection (URI)    Urticaria     Surgical History: Past Surgical History:  Procedure Laterality Date   ADENOIDECTOMY     COLONOSCOPY WITH PROPOFOL N/A 03/23/2020   Procedure: COLONOSCOPY WITH PROPOFOL;  Surgeon: Toney Reil, MD;  Location: [facility] ENDOSCOPY;  Service: Gastroenterology;  Laterality: N/A;   HIP SURGERY     HARDWARE REMOVAL X2   PERIACETUBLAR Bilateral    OSTEOTOMY   TONSILLECTOMY     TYMPANOSTOMY TUBE PLACEMENT      Home Medications:  Allergies as of 09/29/2022       Reactions   Doxycycline Nausea And Vomiting   nausea   Amoxicillin-pot Clavulanate Rash, Other (See Comments)   Has patient had a PCN reaction causing immediate rash, facial/tongue/throat swelling, SOB or lightheadedness with hypotension: Yes Has patient had a PCN reaction causing severe rash involving mucus membranes or skin necrosis: No Has patient had a PCN reaction that required hospitalization: No Has patient had a PCN reaction occurring within the last 10 years: No If all of the above answers are "NO", then may proceed with Cephalosporin use.   Penicillins Rash        Medication List        Accurate as of September 29, 2022 12:29 PM. If you have any questions, ask your nurse or doctor.          STOP taking these medications    LORazepam 0.5 MG tablet Commonly known as: ATIVAN Stopped by: Angela Rocha       TAKE these medications    albuterol 108 (90 Base) MCG/ACT inhaler Commonly known as: VENTOLIN HFA Inhale 2 puffs into the lungs every 6 (six) hours as needed for wheezing or shortness of breath.   amphetamine-dextroamphetamine 25 MG 24 hr capsule Commonly known as: ADDERALL XR Take 25 mg by mouth daily.   busPIRone 15 MG  tablet Commonly known as: BUSPAR Take 15 mg by mouth 2 (two) times daily.   cetirizine 10 MG tablet Commonly known as: ZYRTEC Take 10 mg by mouth daily.   clobetasol 0.05 % external solution Commonly known as: TEMOVATE Apply to aa's scalp QD PRN. Avoid the face, groin, and underarms.   cyanocobalamin 1000 MCG/ML injection Commonly known as: VITAMIN B12 INJECT 1 ML (1,000 MCG TOTAL) INTO THE MUSCLE EVERY 30 (THIRTY) DAYS.   Emgality 120 MG/ML Soaj Generic drug: Galcanezumab-gnlm Inject into the skin.   fluticasone 50 MCG/ACT nasal spray Commonly known as: Flonase Place 1 spray into both nostrils 2 (two) times daily as needed for allergies.   gabapentin 400 MG capsule Commonly known as: NEURONTIN Take 400 mg by mouth 2 (two) times daily.   levonorgestrel 20 MCG/24HR IUD Commonly known as: MIRENA by Intrauterine route.   montelukast 10 MG tablet Commonly known as: SINGULAIR Take 10 mg by mouth at bedtime.   nitrofurantoin 50 MG capsule Commonly known as: MACRODANTIN Take 1 capsule (50 mg total) by mouth daily. Started by: Angela Rocha   ondansetron 4 MG disintegrating tablet Commonly known as: ZOFRAN-ODT Allow 1-2 tablets to dissolve in your mouth every 8 hours as needed for nausea/vomiting   propranolol 10 MG tablet Commonly known as: INDERAL Take 10 mg by mouth daily.   Qelbree 200 MG 24 hr capsule Generic drug: viloxazine ER Take 200 mg by mouth daily.   rizatriptan 10 MG disintegrating tablet Commonly known as: MAXALT-MLT Take 10 mg by mouth as directed. TAKE 1 TAB AT HEADACHE ONSET MAY TAKE A 2ND DOSE AFTER 2 HOURS IF NEEDED NO MORE THAN 2 DOSES/24 HRS   sertraline 100 MG tablet Commonly known as: ZOLOFT Take 150 mg by mouth daily.   tiZANidine 4 MG tablet Commonly known as: ZANAFLEX Take 4 mg by mouth at bedtime.   traZODone 50 MG tablet Commonly known as: DESYREL Take 50 mg by mouth at bedtime.   valACYclovir 500 MG tablet Commonly known  as: VALTREX Take 1 tablet (500 mg total) by mouth 2 (two) times daily. What changed:  when to take this reasons to take this        Allergies:  Allergies  Allergen Reactions   Doxycycline Nausea And Vomiting    nausea   Amoxicillin-Pot Clavulanate Rash and Other (See Comments)    Has patient had a PCN reaction causing immediate rash, facial/tongue/throat swelling, SOB or lightheadedness with hypotension: Yes Has patient had a PCN reaction causing severe rash involving mucus membranes or skin necrosis: No Has patient had a PCN reaction that required hospitalization: No Has patient had a PCN reaction occurring within the last 10 years: No If all of the above answers are "NO", then may proceed with Cephalosporin use.    Penicillins Rash    Family History: Family History  Problem  Relation Age of Onset   Diabetes Mellitus II Father    Colon cancer Father    Lung cancer Paternal Grandfather     Social History:  reports that she has never smoked. She has never been exposed to tobacco smoke. She has never used smokeless tobacco. She reports current alcohol use. She reports that she does not use drugs.   Physical Exam: BP (!) 143/90   Pulse (!) 114   Ht 5\' 3"  (1.6 m)   Wt 171 lb (77.6 kg)   LMP 08/19/2022 (Approximate)   BMI 30.29 kg/m   Constitutional:  Alert and oriented, No acute distress. HEENT: [place] AT, moist mucus membranes.  Trachea midline, no masses. Cardiovascular: No clubbing, cyanosis, or edema. Respiratory: Normal respiratory effort, no increased work of breathing. Psychiatric: Normal mood and affect.   Urinalysis: Dipstick trace blood/ microscopy negative.  Pertinent Imaging: CT abdomen pelvis performed 09/17/2022 and was personally reviewed and interpreted.   Assessment & Plan:    1. Recurrent UTI Recent hospitalization for ESBL producing E. coli. Urinalysis today is clear and will order a post-treatment urine culture.  No upper tract abnormalities on  CT.  We discussed cystoscopy for recurrent UTIs in female is low yield and would not recommend scheduling. Also discussed potential etiologies including anatomy and some patients possessing more virulent bacteria. Will place on an eight-week course of low-dose antibiotic prophylaxis with nitrofurantoin, 50 mg daily.  She was instructed to call our office should she develop recurrent UTI symptoms.   I have reviewed the above documentation for accuracy and completeness, and I agree with the above.   Angela Altes, MD  [facility] [address], [address] [place], [place] [address] [phone]

## 2022-10-02 ENCOUNTER — Encounter: Payer: Self-pay | Admitting: Urology

## 2022-10-03 LAB — CULTURE, URINE COMPREHENSIVE

## 2022-11-11 ENCOUNTER — Other Ambulatory Visit: Payer: Self-pay | Admitting: Medical Genetics

## 2022-11-11 DIAGNOSIS — Z006 Encounter for examination for normal comparison and control in clinical research program: Secondary | ICD-10-CM

## 2022-11-12 ENCOUNTER — Other Ambulatory Visit
Admission: RE | Admit: 2022-11-12 | Discharge: 2022-11-12 | Disposition: A | Discharge status: Home / Self Care | Payer: BC Managed Care – PPO | Source: Ambulatory Visit | Attending: Medical Genetics | Admitting: Medical Genetics

## 2022-11-12 DIAGNOSIS — Z006 Encounter for examination for normal comparison and control in clinical research program: Secondary | ICD-10-CM | POA: Insufficient documentation

## 2022-11-23 LAB — HELIX MOLECULAR SCREEN: Genetic Analysis Overall Interpretation: NEGATIVE

## 2022-11-23 LAB — GENECONNECT MOLECULAR SCREEN

## 2022-12-14 ENCOUNTER — Ambulatory Visit
Admission: EM | Admit: 2022-12-14 | Discharge: 2022-12-14 | Disposition: A | Discharge status: Home / Self Care | Payer: BC Managed Care – PPO | Attending: Emergency Medicine | Admitting: Emergency Medicine

## 2022-12-14 ENCOUNTER — Telehealth: Payer: Self-pay

## 2022-12-14 DIAGNOSIS — N898 Other specified noninflammatory disorders of vagina: Secondary | ICD-10-CM | POA: Diagnosis not present

## 2022-12-14 DIAGNOSIS — J45901 Unspecified asthma with (acute) exacerbation: Secondary | ICD-10-CM | POA: Diagnosis present

## 2022-12-14 LAB — POC COVID19/FLU A&B COMBO
Covid Antigen, POC: NEGATIVE
Influenza A Antigen, POC: NEGATIVE
Influenza B Antigen, POC: NEGATIVE

## 2022-12-14 LAB — POCT URINE PREGNANCY: Preg Test, Ur: NEGATIVE

## 2022-12-14 MED ORDER — FLUCONAZOLE 150 MG PO TABS: 150.0000 mg | ORAL_TABLET | Freq: Once | ORAL | 0 refills | Status: AC

## 2022-12-14 MED ORDER — PROMETHAZINE-DM 6.25-15 MG/5ML PO SYRP: 5.0000 mL | ORAL_SOLUTION | Freq: Four times a day (QID) | ORAL | 0 refills | Status: DC | PRN

## 2022-12-14 MED ORDER — PREDNISONE 10 MG PO TABS: 40.0000 mg | ORAL_TABLET | Freq: Every day | ORAL | 0 refills | Status: AC

## 2022-12-14 NOTE — Telephone Encounter (Signed)
Patient inquiring if she is scheduled for her next annual. Chart reviewed. No future appointments seen. Advised last AE 02/09/22. She will need a February appointment which is not open yet. Will send message to front desk to add her to wait list. Also advised patient to call back in a few weeks to get scheduled.

## 2022-12-14 NOTE — ED Provider Notes (Signed)
Renaldo Fiddler    CSN: [id number] Arrival date & time: 12/14/22  1558      History   Chief Complaint Chief Complaint  Patient presents with   Cough    HPI Angela Rocha is a 27 y.o. female.  Patient presents with 3-day history of fever, congestion, cough, wheezing.  Tmax 101.  Treatment attempted with OTC cold and flu medication.  She also has used her albuterol inhaler; last used at lunchtime today.  Patient also presents with vaginal itching and small amount of white vaginal discharge; she is concerned for a yeast infection.  She denies concern for bacterial vaginitis or STD.  She states her current symptoms are similar to previous episodes of vaginal yeast infection.  No fever, shortness of breath, abdominal pain, dysuria, hematuria, pelvic pain, or other symptoms.  Her medical history includes asthma.  The history is provided by the patient and medical records.    Past Medical History:  Diagnosis Date   ADHD    Anxiety    Asthma    B12 deficiency    Femoral neck stress fracture    GERD (gastroesophageal reflux disease)    Hip dysplasia    History of dysplastic nevus 03/04/2020   right upper back, severe/excision   Migraine    Pelvic congestion syndrome    Pyelonephritis    Recurrent upper respiratory infection (URI)    Urticaria     Patient Active Problem List   Diagnosis Date Noted   Recurrent UTI 09/23/2022   ESBL (extended spectrum beta-lactamase) producing bacteria infection 09/20/2022   Iron deficiency 08/22/2022   Raynaud's syndrome without gangrene 08/22/2022   Positive ANA (antinuclear antibody) 07/25/2022   Facial rash 07/25/2022   Other fatigue 07/25/2022   Frequent infections 07/25/2022   Obesity (BMI 30-39.9) 03/09/2022   Family history of colon cancer 08/02/2021   Sprain of left ankle 01/18/2021   Pain from implanted hardware 03/23/2020   History of colon cancer    Asthma, not well controlled 02/12/2020   History of frequent upper  respiratory infection 02/12/2020   Adverse reaction to food, subsequent encounter 02/12/2020   Drug reaction 02/12/2020   Heartburn 02/12/2020   History of systemic reaction to hymenoptera sting 02/12/2020   History of urticaria 02/12/2020   Numbness and tingling in left arm 01/16/2020   Other allergic rhinitis 01/08/2020   Migraine without aura and without status migrainosus, not intractable 08/23/2017   Generalized anxiety disorder 05/22/2017   Other acquired deformity of hip 05/22/2017   Pyelonephritis 05/17/2017   Attention deficit hyperactivity disorder (ADHD), 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   Vitamin D deficiency 01/12/2016   B12 deficiency 01/12/2016   Status post arthroscopy of hip 12/25/2015   Left hip pain 03/04/2015   Labral tear of hip, degenerative 02/04/2015   Quadriceps tendinitis 01/06/2015   Femoral neck stress fracture 01/06/2015   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/06/2015   Cephalalgia 03/12/2014    Past Surgical History:  Procedure Laterality Date   ADENOIDECTOMY     COLONOSCOPY WITH PROPOFOL N/A 03/23/2020   Procedure: COLONOSCOPY WITH PROPOFOL;  Surgeon: Toney Reil, MD;  Location: [facility] ENDOSCOPY;  Service: Gastroenterology;  Laterality: N/A;   HIP SURGERY     HARDWARE REMOVAL X2   PERIACETUBLAR Bilateral    OSTEOTOMY   TONSILLECTOMY     TYMPANOSTOMY TUBE PLACEMENT      OB History     Gravida  0   Para  0   Term  0   Preterm  0   AB  0   Living  0      SAB  0   IAB  0   Ectopic  0   Multiple  0   Live Births  0            Home Medications    Prior to Admission medications   Medication Sig Start Date End Date Taking? Authorizing Provider  fluconazole (DIFLUCAN) 150 MG tablet Take 1 tablet (150 mg total) by mouth once for 1 dose. Take one tablet today.  May repeat in 3 days. 12/14/22 12/14/22 Yes Mickie Bail, NP  nortriptyline (PAMELOR) 10 MG capsule Start Nortriptyline (Pamelor) 10 mg nightly for one  week, then increase to 20 mg nightly 10/10/22  Yes [provider]  predniSONE (DELTASONE) 10 MG tablet Take 4 tablets (40 mg total) by mouth daily for 5 days. 12/14/22 12/19/22 Yes Mickie Bail, NP  promethazine-dextromethorphan (PROMETHAZINE-DM) 6.25-15 MG/5ML syrup Take 5 mLs by mouth 4 (four) times daily as needed. 12/14/22  Yes Mickie Bail, NP  albuterol (VENTOLIN HFA) 108 (90 Base) MCG/ACT inhaler Inhale 2 puffs into the lungs every 6 (six) hours as needed for wheezing or shortness of breath. 01/08/20   Coral Ceo, NP  amphetamine-dextroamphetamine (ADDERALL XR) 25 MG 24 hr capsule Take 25 mg by mouth daily. 03/08/22   [provider]  busPIRone (BUSPAR) 15 MG tablet Take 15 mg by mouth 2 (two) times daily. 08/04/22   [provider]  cetirizine (ZYRTEC) 10 MG tablet Take 10 mg by mouth daily. 04/17/17   [provider]  clobetasol (TEMOVATE) 0.05 % external solution Apply to aa's scalp QD PRN. Avoid the face, groin, and underarms. 01/18/22   Moye, [place], MD  cyanocobalamin (,VITAMIN B-12,) 1000 MCG/ML injection INJECT 1 ML (1,000 MCG TOTAL) INTO THE MUSCLE EVERY 30 (THIRTY) DAYS. 12/11/17   Shambley, Melody N, CNM  fluticasone (FLONASE) 50 MCG/ACT nasal spray Place 1 spray into both nostrils 2 (two) times daily as needed for allergies. 02/12/20   Ellamae Sia, DO  gabapentin (NEURONTIN) 400 MG capsule Take 400 mg by mouth 2 (two) times daily.    [provider]  Galcanezumab-gnlm (EMGALITY) 120 MG/ML SOAJ Inject into the skin. 01/20/20   [provider]  levonorgestrel (MIRENA) 20 MCG/24HR IUD by Intrauterine route. 01/14/20   [provider]  montelukast (SINGULAIR) 10 MG tablet Take 10 mg by mouth at bedtime. 09/21/21   [provider]  nitrofurantoin (MACRODANTIN) 50 MG capsule Take 1 capsule (50 mg total) by mouth daily. Patient not taking: Reported on 12/14/2022 09/29/22   [name], MD  ondansetron (ZOFRAN-ODT) 4 MG  disintegrating tablet Allow 1-2 tablets to dissolve in your mouth every 8 hours as needed for nausea/vomiting 09/17/22   Loleta Rose, MD  propranolol (INDERAL) 10 MG tablet Take 10 mg by mouth daily. 08/09/22   [provider]  QELBREE 200 MG 24 hr capsule Take 200 mg by mouth daily. 08/09/22   [provider]  rizatriptan (MAXALT-MLT) 10 MG disintegrating tablet Take 10 mg by mouth as directed. TAKE 1 TAB AT HEADACHE ONSET MAY TAKE A 2ND DOSE AFTER 2 HOURS IF NEEDED NO MORE THAN 2 DOSES/24 HRS 04/25/17   [provider]  sertraline (ZOLOFT) 100 MG tablet Take 150 mg by mouth daily. 09/07/21   [provider]  tiZANidine (ZANAFLEX) 4 MG tablet Take 4 mg by mouth at bedtime.  11/01/21   [provider]  traZODone (DESYREL) 50 MG tablet Take 50 mg by mouth at bedtime. 08/09/22   [provider]  valACYclovir (VALTREX) 500 MG tablet Take 1 tablet (500 mg total) by mouth 2 (two) times daily. Patient taking differently: Take 500 mg by mouth 2 (two) times daily as needed (Cold Sores). 02/09/22   Glenetta Borg, CNM    Family History Family History  Problem Relation Age of Onset   Diabetes Mellitus II Father    Colon cancer Father    Lung cancer Paternal Grandfather     Social History Social History   Tobacco Use   Smoking status: Never    Passive exposure: Never   Smokeless tobacco: Never  Vaping Use   Vaping status: Never Used  Substance Use Topics   Alcohol use: Yes    Alcohol/week: 0.0 standard drinks of alcohol    Comment: occas   Drug use: No     Allergies   Doxycycline, Amoxicillin-pot clavulanate, and Penicillins   Review of Systems Review of Systems  Constitutional:  Positive for fever.  HENT:  Positive for congestion, postnasal drip and rhinorrhea. Negative for ear pain and sore throat.   Respiratory:  Positive for cough and wheezing. Negative for shortness of breath.   Cardiovascular:  Negative for chest pain and  palpitations.  Gastrointestinal:  Negative for abdominal pain, diarrhea and vomiting.  Genitourinary:  Positive for vaginal discharge. Negative for dysuria, hematuria and pelvic pain.     Physical Exam Triage Vital Signs ED Triage Vitals  Encounter Vitals Group     BP 12/14/22 1636 121/87     Systolic BP Percentile --      Diastolic BP Percentile --      Pulse Rate 12/14/22 1636 (!) 113     Resp 12/14/22 1636 18     Temp 12/14/22 1636 97.6 F (36.4 C)     Temp src --      SpO2 12/14/22 1636 98 %     Weight --      Height --      Head Circumference --      Peak Flow --      Pain Score 12/14/22 1622 5     Pain Loc --      Pain Education --      Exclude from Growth Chart --    No data found.  Updated Vital Signs BP 121/87   Pulse (!) 113   Temp 97.6 F (36.4 C)   Resp 18   SpO2 98%   Visual Acuity Right Eye Distance:   Left Eye Distance:   Bilateral Distance:    Right Eye Near:   Left Eye Near:    Bilateral Near:     Physical Exam Constitutional:      General: She is not in acute distress. HENT:     Right Ear: Tympanic membrane normal.     Left Ear: Tympanic membrane normal.     Nose: Congestion and rhinorrhea present.     Mouth/Throat:     Mouth: Mucous membranes are moist.     Pharynx: Oropharynx is clear.  Cardiovascular:     Rate and Rhythm: Normal rate and regular rhythm.     Heart sounds: Normal heart sounds.  Pulmonary:     Effort: Pulmonary effort is normal. No respiratory distress.     Breath sounds: Normal breath sounds.  Skin:    General: Skin is warm and dry.  Neurological:  Mental Status: She is alert.      UC Treatments / Results  Labs (all labs ordered are listed, but only abnormal results are displayed) Labs Reviewed  POCT URINE PREGNANCY  POC COVID19/FLU A&B COMBO  CERVICOVAGINAL ANCILLARY ONLY    EKG   Radiology No results found.  Procedures Procedures (including critical care time)  Medications Ordered in  UC Medications - No data to display  Initial Impression / Assessment and Plan / UC Course  I have reviewed the triage vital signs and the nursing notes.  Pertinent labs & imaging results that were available during my care of the patient were reviewed by me and considered in my medical decision making (see chart for details).    Vaginal discharge, vaginal itching, Asthma exacerbation.  Rapid flu and COVID-negative.  No respiratory distress.  O2 sat 98% on room air.  Treating with prednisone and continued use of albuterol inhaler.  Treating cough with Promethazine DM; precautions for drowsiness with this medication discussed.  Patient obtained vaginal self swab for testing.  She declines testing for BV or STDs but only wants testing for yeast.  1 tablet of Diflucan prescribed.  Instructed patient to follow-up with her PCP if her symptoms are not improving.  Education provided on asthma and vaginal yeast infection.  Patient agrees to plan of care.  Final Clinical Impressions(s) / UC Diagnoses   Final diagnoses:  Vaginal itching  Vaginal discharge  Asthma with acute exacerbation, unspecified asthma severity, unspecified whether persistent     Discharge Instructions      Take the prednisone as directed.  Continue to use the albuterol inhaler as directed.    Take the promethazine as directed for cough.  Do not drive, operate machinery, drink alcohol, or perform dangerous activities while taking this medication as it may cause drowsiness.  Take the Diflucan as directed.  The test for vaginal yeast infection is pending.    Follow-up with your primary care provider if your symptoms are not improving.      ED Prescriptions     Medication Sig Dispense Auth. Provider   predniSONE (DELTASONE) 10 MG tablet Take 4 tablets (40 mg total) by mouth daily for 5 days. 20 tablet Mickie Bail, NP   fluconazole (DIFLUCAN) 150 MG tablet Take 1 tablet (150 mg total) by mouth once for 1 dose. Take one  tablet today.  May repeat in 3 days. 1 tablet Mickie Bail, NP   promethazine-dextromethorphan (PROMETHAZINE-DM) 6.25-15 MG/5ML syrup Take 5 mLs by mouth 4 (four) times daily as needed. 118 mL Mickie Bail, NP      PDMP not reviewed this encounter.   Mickie Bail, NP 12/14/22 [phone]

## 2022-12-14 NOTE — ED Triage Notes (Addendum)
Patient to Urgent Care with complaints of drainage/ nasal congestion/ cough/ head pressure/ fevers. Reports hx of asthma and now having thoracic-back pain and wheezing. Max temp 101.  Reports symptoms started 3 days ago. Reports mold in her kitchen closet. Fiance also sick. Has used albuterol inhaler twice today. Also taking alker-seltzer cold and flu/ tylenol.   Also reports vaginal itching- concerned about a yeast infection.

## 2022-12-14 NOTE — Discharge Instructions (Addendum)
Take the prednisone as directed.  Continue to use the albuterol inhaler as directed.    Take the promethazine as directed for cough.  Do not drive, operate machinery, drink alcohol, or perform dangerous activities while taking this medication as it may cause drowsiness.  Take the Diflucan as directed.  The test for vaginal yeast infection is pending.    Follow-up with your primary care provider if your symptoms are not improving.

## 2022-12-16 LAB — CERVICOVAGINAL ANCILLARY ONLY
Candida Glabrata: NEGATIVE
Candida Vaginitis: NEGATIVE
Comment: NEGATIVE
Comment: NEGATIVE

## 2022-12-28 ENCOUNTER — Ambulatory Visit: Payer: BC Managed Care – PPO | Admitting: Pulmonary Disease

## 2023-01-02 ENCOUNTER — Emergency Department
Admission: EM | Admit: 2023-01-02 | Discharge: 2023-01-02 | Disposition: A | Discharge status: Home / Self Care | Payer: BC Managed Care – PPO | Attending: Emergency Medicine | Admitting: Emergency Medicine

## 2023-01-02 ENCOUNTER — Other Ambulatory Visit: Payer: Self-pay

## 2023-01-02 DIAGNOSIS — R300 Dysuria: Secondary | ICD-10-CM | POA: Insufficient documentation

## 2023-01-02 DIAGNOSIS — Z85038 Personal history of other malignant neoplasm of large intestine: Secondary | ICD-10-CM | POA: Diagnosis not present

## 2023-01-02 DIAGNOSIS — J45909 Unspecified asthma, uncomplicated: Secondary | ICD-10-CM | POA: Insufficient documentation

## 2023-01-02 LAB — URINALYSIS, ROUTINE W REFLEX MICROSCOPIC
Bilirubin Urine: NEGATIVE
Glucose, UA: NEGATIVE mg/dL
Ketones, ur: NEGATIVE mg/dL
Leukocytes,Ua: NEGATIVE
Nitrite: NEGATIVE
Protein, ur: NEGATIVE mg/dL
Specific Gravity, Urine: 1.019 (ref 1.005–1.030)
pH: 5 (ref 5.0–8.0)

## 2023-01-02 LAB — POC URINE PREG, ED: Preg Test, Ur: NEGATIVE

## 2023-01-02 MED ORDER — PHENAZOPYRIDINE HCL 200 MG PO TABS
200.0000 mg | ORAL_TABLET | Freq: Three times a day (TID) | ORAL | 0 refills | Status: DC | PRN
Start: 2023-01-02 — End: 2023-05-12

## 2023-01-02 MED ORDER — ONDANSETRON 4 MG PO TBDP: 4.0000 mg | ORAL_TABLET | Freq: Three times a day (TID) | ORAL | 0 refills | Status: AC | PRN

## 2023-01-02 MED ORDER — HYDROCODONE-ACETAMINOPHEN 5-325 MG PO TABS: 1.0000 | ORAL_TABLET | Freq: Four times a day (QID) | ORAL | 0 refills | Status: DC | PRN

## 2023-01-02 MED ORDER — KETOROLAC TROMETHAMINE 60 MG/2ML IM SOLN
30.0000 mg | Freq: Once | INTRAMUSCULAR | Status: AC
  Administered 2023-01-02: 30 mg via INTRAMUSCULAR
  Filled 2023-01-02: qty 2

## 2023-01-02 MED ORDER — PHENAZOPYRIDINE HCL 200 MG PO TABS
200.0000 mg | ORAL_TABLET | Freq: Once | ORAL | Status: AC
  Administered 2023-01-02: 200 mg via ORAL
  Filled 2023-01-02: qty 1

## 2023-01-02 NOTE — ED Triage Notes (Signed)
Pt to ed from upstairs for a UTI. Pt has frequent UTIs and has been hospitalized multiple times for pylo. Pt is caox4, in no acute distress and ambulatory in triage. Pt states she has been having flank pain and blood in her urine for a week. Pt denies any N/V or fevers.

## 2023-01-02 NOTE — Discharge Instructions (Signed)
Take Pyridium for urinary discomfort.  You may take Ibuprofen as needed for pain, Norco as needed for more severe pain.  You may take Zofran as needed for nausea.  You may check results of urine culture in MyChart in 48 hours.  Return to the ER for worsening symptoms, persistent vomiting, fever or other concerns.

## 2023-01-02 NOTE — ED Provider Notes (Signed)
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  Patient with a history of frequent UTIs, most recently referred to urology.  Several hospitalizations for pyelonephritis.  Endorses a 1 week history of bilateral flank pain and pink-tinged urine with dysuria.  Denies fever/chills, chest pain, shortness of breath, abdominal pain, nausea or vomiting.     Past Medical History   Past Medical History:  Diagnosis Date   ADHD    Anxiety    Asthma    B12 deficiency    Femoral neck stress fracture    GERD (gastroesophageal reflux disease)    Hip dysplasia    History of dysplastic nevus 03/04/2020   right upper back, severe/excision   Migraine    Pelvic congestion syndrome    Pyelonephritis    Recurrent upper respiratory infection (URI)    Urticaria      Active Problem List   Patient Active Problem List   Diagnosis Date Noted   Recurrent UTI 09/23/2022   ESBL (extended spectrum beta-lactamase) producing bacteria infection 09/20/2022   Iron deficiency 08/22/2022   Raynaud's syndrome without gangrene 08/22/2022   Positive ANA (antinuclear antibody) 07/25/2022   Facial rash 07/25/2022   Other fatigue 07/25/2022   Frequent infections 07/25/2022   Obesity (BMI 30-39.9) 03/09/2022   Family history of colon cancer 08/02/2021   Sprain of left ankle 01/18/2021   Pain from implanted hardware 03/23/2020   History of colon cancer    Asthma, not well controlled 02/12/2020   History of frequent upper respiratory infection 02/12/2020   Adverse reaction to food, subsequent encounter 02/12/2020   Drug reaction 02/12/2020   Heartburn 02/12/2020   History of systemic reaction to hymenoptera sting 02/12/2020   History of urticaria  02/12/2020   Numbness and tingling in left arm 01/16/2020   Other allergic rhinitis 01/08/2020   Migraine without aura and without status migrainosus, not intractable 08/23/2017   Generalized anxiety disorder 05/22/2017   Other acquired deformity of hip 05/22/2017   Pyelonephritis 05/17/2017   Attention deficit hyperactivity disorder (ADHD), 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   Vitamin D deficiency 01/12/2016   B12 deficiency 01/12/2016   Status post arthroscopy of hip 12/25/2015   Left hip pain 03/04/2015   Labral tear of hip, degenerative 02/04/2015   Quadriceps tendinitis 01/06/2015   Femoral neck stress fracture 01/06/2015   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 Authorizing Provider  HYDROcodone-acetaminophen (NORCO) 5-325 MG tablet Take 1 tablet by mouth every 6 (six) hours as needed for moderate pain (pain score 4-6). 01/02/23  Yes Irean Hong,  MD  ondansetron (ZOFRAN-ODT) 4 MG disintegrating tablet Take 1 tablet (4 mg total) by mouth every 8 (eight) hours as needed for nausea or vomiting. 01/02/23  Yes Irean Hong, MD  phenazopyridine (PYRIDIUM) 200 MG tablet Take 1 tablet (200 mg total) by mouth 3 (three) times daily as needed for pain. 01/02/23  Yes Irean Hong, MD  albuterol (VENTOLIN HFA) 108 (90 Base) MCG/ACT inhaler Inhale 2 puffs into the lungs every 6 (six) hours as needed for wheezing or shortness of breath.  01/08/20   Coral Ceo, NP  amphetamine-dextroamphetamine (ADDERALL XR) 25 MG 24 hr capsule Take 25 mg by mouth daily. 03/08/22   [provider]  busPIRone (BUSPAR) 15 MG tablet Take 15 mg by mouth 2 (two) times daily. 08/04/22   [provider]  cetirizine (ZYRTEC) 10 MG tablet Take 10 mg by mouth daily. 04/17/17   [provider]  clobetasol (TEMOVATE) 0.05 % external solution Apply to aa's scalp QD PRN. Avoid the face, groin, and underarms. 01/18/22   Moye, [place], MD  cyanocobalamin (,VITAMIN B-12,) 1000 MCG/ML injection INJECT 1 ML (1,000 MCG TOTAL) INTO THE MUSCLE EVERY 30 (THIRTY) DAYS. 12/11/17   Shambley, Melody N, CNM  fluticasone (FLONASE) 50 MCG/ACT nasal spray Place 1 spray into both nostrils 2 (two) times daily as needed for allergies. 02/12/20   Ellamae Sia, DO  gabapentin (NEURONTIN) 400 MG capsule Take 400 mg by mouth 2 (two) times daily.    [provider]  Galcanezumab-gnlm (EMGALITY) 120 MG/ML SOAJ Inject into the skin. 01/20/20   [provider]  levonorgestrel (MIRENA) 20 MCG/24HR IUD by Intrauterine route. 01/14/20   [provider]  montelukast (SINGULAIR) 10 MG tablet Take 10 mg by mouth at bedtime. 09/21/21   [provider]  nitrofurantoin (MACRODANTIN) 50 MG capsule Take 1 capsule (50 mg total) by mouth daily. Patient not taking: Reported on 12/14/2022 09/29/22   [name], MD  nortriptyline (PAMELOR) 10 MG capsule Start Nortriptyline (Pamelor) 10 mg nightly for one week, then increase to 20 mg nightly 10/10/22   [provider]  promethazine-dextromethorphan (PROMETHAZINE-DM) 6.25-15 MG/5ML syrup Take 5 mLs by mouth 4 (four) times daily as needed. 12/14/22   Mickie Bail, NP  propranolol (INDERAL) 10 MG tablet Take 10 mg by mouth daily. 08/09/22   [provider]  QELBREE 200 MG 24 hr capsule Take 200 mg by mouth daily. 08/09/22   [provider]  rizatriptan (MAXALT-MLT) 10 MG  disintegrating tablet Take 10 mg by mouth as directed. TAKE 1 TAB AT HEADACHE ONSET MAY TAKE A 2ND DOSE AFTER 2 HOURS IF NEEDED NO MORE THAN 2 DOSES/24 HRS 04/25/17   [provider]  sertraline (ZOLOFT) 100 MG tablet Take 150 mg by mouth daily. 09/07/21   [provider]  tiZANidine (ZANAFLEX) 4 MG tablet Take 4 mg by mouth at bedtime. 11/01/21   [provider]  traZODone (DESYREL) 50 MG tablet Take 50 mg by mouth at bedtime. 08/09/22   [provider]  valACYclovir (VALTREX) 500 MG tablet Take 1 tablet (500 mg total) by mouth 2 (two) times daily. Patient taking differently: Take 500 mg by mouth 2 (two) times daily as needed (Cold Sores). 02/09/22   Glenetta Borg, CNM     Allergies  Doxycycline, Amoxicillin-pot clavulanate, and Penicillins   Family History   Family History  Problem Relation Age of Onset   Diabetes Mellitus II Father    Colon cancer  Father    Lung cancer Paternal Grandfather      Physical Exam  Triage Vital Signs: ED Triage Vitals [01/02/23 0235]  Encounter Vitals Group     BP (!) 143/97     Systolic BP Percentile      Diastolic BP Percentile      Pulse Rate (!) 115     Resp 16     Temp 98 F (36.7 C)     Temp Source Oral     SpO2 98 %     Weight      Height      Head Circumference      Peak Flow      Pain Score 5     Pain Loc      Pain Education      Exclude from Growth Chart     Updated Vital Signs: BP 119/87   Pulse 86   Temp 98 F (36.7 C) (Oral)   Resp 18   SpO2 99%    General: Awake, no distress.  CV:  Mildly tachycardic.  Good peripheral perfusion.  Resp:  Normal effort.  CTAB. Abd:  Nontender.  No CVAT.  No distention.  Other:  No truncal vesicles.   ED Results / Procedures / Treatments  Labs (all labs ordered are listed, but only abnormal results are displayed) Labs Reviewed  URINALYSIS, ROUTINE W REFLEX MICROSCOPIC - Abnormal; Notable for the following components:      Result Value    Color, Urine YELLOW (*)    APPearance HAZY (*)    Hgb urine dipstick LARGE (*)    Bacteria, UA RARE (*)    All other components within normal limits  POC URINE PREG, ED - Normal  URINE CULTURE     EKG  None   RADIOLOGY None   Official radiology report(s): No results found.   PROCEDURES:  Critical Care performed: No  Procedures   MEDICATIONS ORDERED IN ED: Medications  ketorolac (TORADOL) injection 30 mg (30 mg Intramuscular Given 01/02/23 0333)  phenazopyridine (PYRIDIUM) tablet 200 mg (200 mg Oral Given 01/02/23 0334)     IMPRESSION / MDM / ASSESSMENT AND PLAN / ED COURSE  I reviewed the triage vital signs and the nursing notes.                             27 year old female presenting with dysuria. Differential diagnosis includes, but is not limited to, ovarian cyst, ovarian torsion, acute appendicitis, diverticulitis, urinary tract infection/pyelonephritis, endometriosis, bowel obstruction, colitis, renal colic, gastroenteritis, hernia, fibroids, endometriosis, pregnancy related pain including ectopic pregnancy, etc. I have personally reviewed patient's records and note a last urgent care visit for UTI on 11/19/2022 for which she was prescribed Keflex.  Patient's presentation is most consistent with acute complicated illness / injury requiring diagnostic workup.  Awaiting UA result.  Administer IM Ketorolac and reassess.  Clinical Course as of 01/02/23 0346  Mon Jan 02, 2023  7425 Discussed with patient UA negative for leukocyte, nitrite and only 0-5 WBC.  Urine culture added.  Given patient's frequent antibiotic use, would prefer to hold off until urine culture is back.  Will treat symptomatically with ketorolac and Pyridium.  She will follow-up with her urologist.  Strict return precautions given.  Patient verbalizes understanding and agrees with plan of care. [JS]    Clinical Course User Index [JS] Irean Hong, MD     FINAL CLINICAL IMPRESSION(S) / ED  DIAGNOSES   Final diagnoses:  Dysuria     Rx / DC Orders   ED Discharge Orders          Ordered    HYDROcodone-acetaminophen (NORCO) 5-325 MG tablet  Every 6 hours PRN        01/02/23 0326    phenazopyridine (PYRIDIUM) 200 MG tablet  3 times daily PRN        01/02/23 0326    ondansetron (ZOFRAN-ODT) 4 MG disintegrating tablet  Every 8 hours PRN        01/02/23 0326             Note:  This document was prepared using Dragon voice recognition software and may include unintentional dictation errors.   Irean Hong, MD 01/02/23 [id number]

## 2023-01-03 LAB — URINE CULTURE

## 2023-01-12 ENCOUNTER — Encounter: Payer: BC Managed Care – PPO | Admitting: Dermatology

## 2023-01-16 ENCOUNTER — Ambulatory Visit: Payer: 59 | Admitting: Dermatology

## 2023-01-16 ENCOUNTER — Other Ambulatory Visit: Payer: Self-pay

## 2023-01-16 ENCOUNTER — Encounter: Payer: Self-pay | Admitting: Dermatology

## 2023-01-16 DIAGNOSIS — Z808 Family history of malignant neoplasm of other organs or systems: Secondary | ICD-10-CM

## 2023-01-16 DIAGNOSIS — L719 Rosacea, unspecified: Secondary | ICD-10-CM

## 2023-01-16 DIAGNOSIS — L700 Acne vulgaris: Secondary | ICD-10-CM

## 2023-01-16 DIAGNOSIS — D2372 Other benign neoplasm of skin of left lower limb, including hip: Secondary | ICD-10-CM | POA: Diagnosis not present

## 2023-01-16 DIAGNOSIS — L812 Freckles: Secondary | ICD-10-CM

## 2023-01-16 DIAGNOSIS — L814 Other melanin hyperpigmentation: Secondary | ICD-10-CM | POA: Diagnosis not present

## 2023-01-16 DIAGNOSIS — D1801 Hemangioma of skin and subcutaneous tissue: Secondary | ICD-10-CM

## 2023-01-16 DIAGNOSIS — D239 Other benign neoplasm of skin, unspecified: Secondary | ICD-10-CM

## 2023-01-16 DIAGNOSIS — L578 Other skin changes due to chronic exposure to nonionizing radiation: Secondary | ICD-10-CM

## 2023-01-16 DIAGNOSIS — Z86018 Personal history of other benign neoplasm: Secondary | ICD-10-CM

## 2023-01-16 DIAGNOSIS — Z1283 Encounter for screening for malignant neoplasm of skin: Secondary | ICD-10-CM | POA: Diagnosis not present

## 2023-01-16 DIAGNOSIS — Q825 Congenital non-neoplastic nevus: Secondary | ICD-10-CM

## 2023-01-16 DIAGNOSIS — D229 Melanocytic nevi, unspecified: Secondary | ICD-10-CM

## 2023-01-16 DIAGNOSIS — Z7189 Other specified counseling: Secondary | ICD-10-CM

## 2023-01-16 DIAGNOSIS — W908XXA Exposure to other nonionizing radiation, initial encounter: Secondary | ICD-10-CM

## 2023-01-16 MED ORDER — ADAPALENE 0.3 % EX GEL: CUTANEOUS | 11 refills | Status: AC

## 2023-01-16 NOTE — Progress Notes (Signed)
 Follow-Up Visit   Subjective  Angela Rocha is a 28 y.o. female who presents for the following: Skin Cancer Screening and Full Body Skin Exam. Hx of dysplastic nevus.   C/O sensitive skin. Had allergy testing after last visit. States everything was normal. Has been seeing rheumatologist since last visit as well. Hx of positive ANA. Has to take Benadryl  often. Uses All Free & Clear laundry detergent. Takes Zyrtec and Claritin  daily.  The patient presents for Total-Body Skin Exam (TBSE) for skin cancer screening and mole check. The patient has spots, moles and lesions to be evaluated, some may be new or changing and the patient may have concern these could be cancer.    The following portions of the chart were reviewed this encounter and updated as appropriate: medications, allergies, medical history  Review of Systems:  No other skin or systemic complaints except as noted in HPI or Assessment and Plan.  Objective  Well appearing patient in no apparent distress; mood and affect are within normal limits.  A full examination was performed including scalp, head, eyes, ears, nose, lips, neck, chest, axillae, abdomen, back, buttocks, bilateral upper extremities, bilateral lower extremities, hands, feet, fingers, toes, fingernails, and toenails. All findings within normal limits unless otherwise noted below.   Relevant physical exam findings are noted in the Assessment and Plan.    Assessment & Plan   FAMILY HISTORY OF SKIN CANCER What type(s): Melanoma Who affected: Maternal grandmother   HISTORY OF DYSPLASTIC NEVUS. Right upper back. Severe, excised. 03/04/2020. No evidence of recurrence today Recommend regular full body skin exams Recommend daily broad spectrum sunscreen SPF 30+ to sun-exposed areas, reapply every 2 hours as needed.  Call if any new or changing lesions are noted between office visits   SKIN CANCER SCREENING PERFORMED TODAY.  ACTINIC DAMAGE - Chronic condition,  secondary to cumulative UV/sun exposure - diffuse scaly erythematous macules with underlying dyspigmentation - Recommend daily broad spectrum sunscreen SPF 30+ to sun-exposed areas, reapply every 2 hours as needed.  - Staying in the shade or wearing long sleeves, sun glasses (UVA+UVB protection) and wide brim hats (4-inch brim around the entire circumference of the hat) are also recommended for sun protection.  - Call for new or changing lesions.  LENTIGINES, HEMANGIOMAS - Benign normal skin lesions - Benign-appearing - Call for any changes  MELANOCYTIC NEVI - Tan-brown and/or pink-flesh-colored symmetric macules and papules - Benign appearing on exam today - Observation - Call clinic for new or changing moles - Recommend daily use of broad spectrum spf 30+ sunscreen to sun-exposed areas.   DERMATOFIBROMA Exam: Firm pink/brown papulenodule with dimple sign at anterior left leg. Treatment Plan: A dermatofibroma is a benign growth possibly related to trauma, such as an insect bite, cut from shaving, or inflamed acne-type bump.  Treatment options to remove include shave or excision with resulting scar and risk of recurrence.  Since benign-appearing and not bothersome, will observe for now.   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 Recommend observation. Call clinic for new or changing moles. Recommend daily use of broad spectrum spf 30+ sunscreen to sun-exposed areas.   ROSACEA Exam Mid face erythema with telangiectasias   Chronic and persistent condition with duration or expected duration over one year. Condition is symptomatic/ bothersome to patient. Not currently at goal.   Rosacea is a chronic progressive skin condition usually affecting the face of  adults, causing redness and/or acne bumps. It is treatable but not curable. It sometimes affects the eyes (ocular rosacea) as well. It may  respond to topical and/or systemic medication and can flare with stress, sun exposure, alcohol, exercise, topical steroids (including hydrocortisone /cortisone 10) and some foods.  Daily application of broad spectrum spf 30+ sunscreen to face is recommended to reduce flares.  Patient denies grittiness of the eyes  Treatment Plan   Counseling for BBL / IPL / Laser and Coordination of Care Discussed the treatment option of Broad Band Light (BBL) /Intense Pulsed Light (IPL)/ Laser for skin discoloration, including brown spots and redness.  Typically we recommend at least 1-3 treatment sessions about 5-8 weeks apart for best results.  Cannot have tanned skin when BBL performed, and regular use of sunscreen/photoprotection is advised after the procedure to help maintain results. The patient's condition may also require maintenance treatments in the future.  The fee for BBL / laser treatments is $350 per treatment session for the whole face.  A fee can be quoted for other parts of the body.  Insurance typically does not pay for BBL/laser treatments and therefore the fee is an out-of-pocket cost. Recommend prophylactic valtrex  treatment. Once scheduled for procedure, will send Rx in prior to patient's appointment.   Breakouts: patient uses daily cetirizine and PRN diphenhydramine    MULTIPLE BENIGN NEVI   CHERRY ANGIOMA   LENTIGINES   EPHELIDES   DERMATOFIBROMA   ROSACEA   CONGENITAL NON-NEOPLASTIC NEVUS   ACTINIC ELASTOSIS   Return in about 1 year (around 01/16/2024) for TBSE, HxDN.  I, Jill Parcell, CMA, am acting as scribe for Boneta Sharps, MD.   Documentation: I have reviewed the above documentation for accuracy and completeness, and I agree with the above.  Boneta Sharps, MD

## 2023-01-16 NOTE — Patient Instructions (Addendum)
 Recommend daily broad spectrum sunscreen SPF 30+ to sun-exposed areas, reapply every 2 hours as needed. Call for new or changing lesions.  Staying in the shade or wearing long sleeves, sun glasses (UVA+UVB protection) and wide brim hats (4-inch brim around the entire circumference of the hat) are also recommended for sun protection.    Rosacea:  Counseling for BBL / IPL / Laser and Coordination of Care Discussed the treatment option of Broad Band Light (BBL) /Intense Pulsed Light (IPL)/ Laser for skin discoloration, including brown spots and redness.  Typically we recommend at least 1-3 treatment sessions about 5-8 weeks apart for best results.  Cannot have tanned skin when BBL performed, and regular use of sunscreen/photoprotection is advised after the procedure to help maintain results. The patient's condition may also require maintenance treatments in the future.  The fee for BBL / laser treatments is $350 per treatment session for the whole face.  A fee can be quoted for other parts of the body.  Insurance typically does not pay for BBL/laser treatments and therefore the fee is an out-of-pocket cost. Recommend prophylactic valtrex  treatment. Once scheduled for procedure, will send Rx in prior to patient's appointment.      Melanoma ABCDEs  Melanoma is the most dangerous type of skin cancer, and is the leading cause of death from skin disease.  You are more likely to develop melanoma if you: Have light-colored skin, light-colored eyes, or red or blond hair Spend a lot of time in the sun Tan regularly, either outdoors or in a tanning bed Have had blistering sunburns, especially during childhood Have a close family member who has had a melanoma Have atypical moles or large birthmarks  Early detection of melanoma is key since treatment is typically straightforward and cure rates are extremely high if we catch it early.   The first sign of melanoma is often a change in a mole or a new dark  spot.  The ABCDE system is a way of remembering the signs of melanoma.  A for asymmetry:  The two halves do not match. B for border:  The edges of the growth are irregular. C for color:  A mixture of colors are present instead of an even brown color. D for diameter:  Melanomas are usually (but not always) greater than 6mm - the size of a pencil eraser. E for evolution:  The spot keeps changing in size, shape, and color.  Please check your skin once per month between visits. You can use a small mirror in front and a large mirror behind you to keep an eye on the back side or your body.   If you see any new or changing lesions before your next follow-up, please call to schedule a visit.  Please continue daily skin protection including broad spectrum sunscreen SPF 30+ to sun-exposed areas, reapplying every 2 hours as needed when you're outdoors.   Staying in the shade or wearing long sleeves, sun glasses (UVA+UVB protection) and wide brim hats (4-inch brim around the entire circumference of the hat) are also recommended for sun protection.       Due to recent changes in healthcare laws, you may see results of your pathology and/or laboratory studies on MyChart before the doctors have had a chance to review them. We understand that in some cases there may be results that are confusing or concerning to you. Please understand that not all results are received at the same time and often the doctors may need to  interpret multiple results in order to provide you with the best plan of care or course of treatment. Therefore, we ask that you please give us  2 business days to thoroughly review all your results before contacting the office for clarification. Should we see a critical lab result, you will be contacted sooner.   If You Need Anything After Your Visit  If you have any questions or concerns for your doctor, please call our main line at [phone] and press option 4 to reach your doctor's  medical assistant. If no one answers, please leave a voicemail as directed and we will return your call as soon as possible. Messages left after 4 pm will be answered the following business day.   You may also send us  a message via MyChart. We typically respond to MyChart messages within 1-2 business days.  For prescription refills, please ask your pharmacy to contact our office. Our fax number is [phone].  If you have an urgent issue when the clinic is closed that cannot wait until the next business day, you can page your doctor at the number below.    Please note that while we do our best to be available for urgent issues outside of office hours, we are not available 24/7.   If you have an urgent issue and are unable to reach us , you may choose to seek medical care at your doctor's office, retail clinic, urgent care center, or emergency room.  If you have a medical emergency, please immediately call 911 or go to the emergency department.  Pager Numbers  - Dr. Hester: [phone]  - Dr. Jackquline: [phone]  - Dr. Claudene: [phone]   In the event of inclement weather, please call our main line at [phone] for an update on the status of any delays or closures.  Dermatology Medication Tips: Please keep the boxes that topical medications come in in order to help keep track of the instructions about where and how to use these. Pharmacies typically print the medication instructions only on the boxes and not directly on the medication tubes.   If your medication is too expensive, please contact our office at [phone] option 4 or send us  a message through MyChart.   We are unable to tell what your co-pay for medications will be in advance as this is different depending on your insurance coverage. However, we may be able to find a substitute medication at lower cost or fill out paperwork to get insurance to cover a needed medication.   If a prior authorization is required  to get your medication covered by your insurance company, please allow us  1-2 business days to complete this process.  Drug prices often vary depending on where the prescription is filled and some pharmacies may offer cheaper prices.  The website www.goodrx.com contains coupons for medications through different pharmacies. The prices here do not account for what the cost may be with help from insurance (it may be cheaper with your insurance), but the website can give you the price if you did not use any insurance.  - You can print the associated coupon and take it with your prescription to the pharmacy.  - You may also stop by our office during regular business hours and pick up a GoodRx coupon card.  - If you need your prescription sent electronically to a different pharmacy, notify our office through [facility] or by phone at [phone] option 4.     Si Usted Necesita Algo Despus de Su  Visita  Tambin puede enviarnos un mensaje a travs de MyChart. Por lo general respondemos a los mensajes de MyChart en el transcurso de 1 a 2 das hbiles.  Para renovar recetas, por favor pida a su farmacia que se ponga en contacto con nuestra oficina. Randi lakes de fax es [place] [phone].  Si tiene un asunto urgente cuando la clnica est cerrada y que no puede esperar hasta el siguiente da hbil, puede llamar/localizar a su doctor(a) al nmero que aparece a continuacin.   Por favor, tenga en cuenta que aunque hacemos todo lo posible para estar disponibles para asuntos urgentes fuera del horario de [place], no estamos disponibles las 24 horas del da, los 7 [address]  [address] de la [place].   Si tiene un problema urgente y no puede comunicarse con nosotros, puede optar por buscar atencin mdica  en el consultorio de su doctor(a), en una clnica privada, en un centro de atencin urgente o en una sala de emergencias.  Si tiene engineer, drilling, por favor llame inmediatamente al 911 o vaya a la sala  de emergencias.  Nmeros de bper  - Dr. Hester: [phone]  - Dra. Jackquline: [phone]  - Dr. Claudene: [phone]   En caso de inclemencias del tiempo, por favor llame a landry capes principal al [phone] para una actualizacin sobre el [place] de cualquier retraso o cierre.  Consejos para la medicacin en dermatologa: Por favor, guarde las cajas en las que vienen los medicamentos de uso tpico para ayudarle a seguir las instrucciones sobre dnde y cmo usarlos. Las farmacias generalmente imprimen las instrucciones del medicamento slo en las cajas y no directamente en los tubos del [place].   Si su medicamento es muy caro, por favor, pngase en contacto con landry rieger llamando al [phone] y presione la opcin 4 o envenos un mensaje a travs de Clinical Cytogeneticist.   No podemos decirle cul ser su copago por los medicamentos por adelantado ya que esto es diferente dependiendo de la cobertura de su seguro. Sin embargo, es posible que podamos encontrar un medicamento sustituto a audiological scientist un formulario para que el seguro cubra el medicamento que se considera necesario.   Si se requiere una autorizacin previa para que su compaa de seguros cubra su medicamento, por favor permtanos de 1 a 2 das hbiles para completar este proceso.  Los precios de los medicamentos varan con frecuencia dependiendo del environmental consultant de dnde se surte la receta y alguna farmacias pueden ofrecer precios ms baratos.  El sitio web www.goodrx.com tiene cupones para medicamentos de health and safety inspector. Los precios aqu no tienen en cuenta lo que podra costar con la ayuda del seguro (puede ser ms barato con su seguro), pero el sitio web puede darle el precio si no utiliz tourist information centre manager.  - Puede imprimir el cupn correspondiente y llevarlo con su receta a la farmacia.  - Tambin puede pasar por nuestra oficina durante el horario de atencin regular y education officer, museum una tarjeta de cupones de GoodRx.  -  Si necesita que su receta se enve electrnicamente a una farmacia diferente, informe a nuestra oficina a travs de MyChart de [place] o por telfono llamando al [phone] y presione la opcin 4.

## 2023-01-16 NOTE — Progress Notes (Signed)
 Patient requested refill of Adapalene after visit 01/16/2023. Okay to Rf per Dr. Katrinka Blazing.

## 2023-02-08 NOTE — Progress Notes (Deleted)
 Office Visit Note  Patient: Angela Rocha             Date of Birth: January 13, 1995           MRN: [id number]             PCP: Carren Rang, PA-C Referring: Carren Rang, PA-C Visit Date: 02/22/2023   Subjective:  No chief complaint on file.   History of Present Illness: Angela Rocha is a 28 y.o. female here for follow up for positive ANA with ongoing facial rashes, Raynaud's, joint pain, and chronic fatigue.    Previous HPI 08/22/2022 Angela Rocha is a 28 y.o. female here for follow up for positive ANA with ongoing facial rashes, Raynaud's, joint pain, and chronic fatigue.  Lab workup at our initial visit was mostly negative except for low iron.  Labs in normal range included her vitamin D and specific extractable nuclear antibodies.  Since her last visit she had some more episodes of facial rash breaking out photos reviewed together in clinic today occurred while driving in the car and she says they resolve after about 10 to 15 minutes and a very hot while present.  Still having leg cramping and discomfort sensation most often while lying still in bed.   Previous HPI 07/25/22 Angela Rocha is a 28 y.o. female here for evaluation of positive ANA checked and associated with multiple symptoms including chronic fatigue, tremors, Raynaud's, facial rashes, and joint pain. She has a medical history including asthma, migraines, psoriasis, ADHD, and multiple left hip surgeries.   Her biggest complaint is primarily persistent daily fatigue with low energy and often needing up to 14 hours of sleep to feel rested.  Problem has been chronic but feels it is worse in the past approximately 2 years.  Also has trouble with migraine headaches and experiences bilateral hand tremor.  Sees neurology for this currently takes gabapentin 400 mg twice daily with significant but incomplete benefit.  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   Has a history of asthma also previously had problems from recurrent sinus infections.  Since giving send to control and with repeat pneumococcal vaccination the URI rate has improved still has some issues with recurrent urinary tract infections.  Multiple episodes of cystitis and at least once with pyelonephritis a few years ago.   History of joint pain in several areas biggest problem by far has been injury at the left hip with previous labral tear poor outcomes from arthroscopy requiring several surgical revision.  Now doing well does not have much hip pain though still has some lateral pain or bursitis pretty frequently.  Biggest issue now is low back pain and imaging showing degenerative changes at facet joint of bottom 2 vertebrae.  Also gets some pain in the lower legs involving the calf muscle ankle and foot.  This is most severe when lying for long periods of time frequently has to get up and move around to alleviate this.  Does not see much visible joint swelling.  Has noticed some nodules about type of changes at the back of the ankles.   History of psoriasis with rashes at the base of occiput treated with topical steroid solution but has not needed any treatment for a few years. Facial rash with flushing has not identified specific trigger usually last 5 to  10 minutes is hot but not itchy or painful and no residual changes. Reports this occurring sometimes after hot showers, stress, not specifically sun exposure. Also had rashes in axillae treated with topical medication per derm. Feels stiffness and tenderness in the right side of the neck feels there is swelling occasionally but usually does not have pain or nodules present on a daily basis.   No Rheumatology ROS completed.   PMFS History:  Patient Active Problem List   Diagnosis Date Noted   Recurrent UTI 09/23/2022   ESBL (extended spectrum  beta-lactamase) producing bacteria infection 09/20/2022   Iron deficiency 08/22/2022   Raynaud's syndrome without gangrene 08/22/2022   Positive ANA (antinuclear antibody) 07/25/2022   Facial rash 07/25/2022   Other fatigue 07/25/2022   Frequent infections 07/25/2022   Obesity (BMI 30-39.9) 03/09/2022   Family history of colon cancer 08/02/2021   Sprain of left ankle 01/18/2021   Pain from implanted hardware 03/23/2020   History of colon cancer    Asthma, not well controlled 02/12/2020   History of frequent upper respiratory infection 02/12/2020   Adverse reaction to food, subsequent encounter 02/12/2020   Drug reaction 02/12/2020   Heartburn 02/12/2020   History of systemic reaction to hymenoptera sting 02/12/2020   History of urticaria 02/12/2020   Numbness and tingling in left arm 01/16/2020   Other allergic rhinitis 01/08/2020   Migraine without aura and without status migrainosus, not intractable 08/23/2017   Generalized anxiety disorder 05/22/2017   Other acquired deformity of hip 05/22/2017   Pyelonephritis 05/17/2017   Attention deficit hyperactivity disorder (ADHD), 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   Vitamin D deficiency 01/12/2016   B12 deficiency 01/12/2016   Status post arthroscopy of hip 12/25/2015   Left hip pain 03/04/2015   Labral tear of hip, degenerative 02/04/2015   Quadriceps tendinitis 01/06/2015   Femoral neck stress fracture 01/06/2015   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/06/2015   Cephalalgia 03/12/2014    Past Medical History:  Diagnosis Date   ADHD    Anxiety    Asthma    B12 deficiency    Femoral neck stress fracture    GERD (gastroesophageal reflux disease)    Hip dysplasia    History of dysplastic nevus 03/04/2020   right upper back, severe/excision   Migraine    Pelvic congestion syndrome    Pyelonephritis    Recurrent upper respiratory infection (URI)    Urticaria     Family History  Problem Relation Age of Onset    Diabetes Mellitus II Father    Colon cancer Father    Melanoma Maternal Grandmother    Lung cancer Paternal Grandfather    Past Surgical History:  Procedure Laterality Date   ADENOIDECTOMY     COLONOSCOPY WITH PROPOFOL N/A 03/23/2020   Procedure: COLONOSCOPY WITH PROPOFOL;  Surgeon: Toney Reil, MD;  Location: [facility] ENDOSCOPY;  Service: Gastroenterology;  Laterality: N/A;   HIP SURGERY     HARDWARE REMOVAL X2   PERIACETUBLAR Bilateral    OSTEOTOMY   TONSILLECTOMY     TYMPANOSTOMY TUBE PLACEMENT     Social History   Social History Narrative   Not on file   Immunization History  Administered Date(s) Administered   HIB, Unspecified 12/11/1997, 02/09/1998, 02/03/1999   HPV Quadrivalent 05/24/2010, 05/24/2011, 05/23/2012   Hep A, Unspecified 12/17/2008, 05/24/2010   Hep B, Unspecified 04/10/1998, 07/13/1998   Influenza Inj Mdck Quad Pf 09/28/2021   Influenza,inj,Quad PF,6+ Mos 10/26/2017   Influenza-Unspecified 12/30/2016  MMR 02/03/1999, 08/22/2002   Meningococcal Conjugate 12/17/2008, 08/15/2016   PPD Test 08/25/2021, 09/21/2021, 10/05/2021   Pneumococcal Conjugate-13 10/26/1998, 04/29/1999   Pneumococcal Polysaccharide-23 03/04/2020   Td (Adult),unspecified 12/17/2008   Tdap 12/31/2019   Varicella 10/26/1998, 10/16/2007     Objective: Vital Signs: There were no vitals taken for this visit.   Physical Exam   Musculoskeletal Exam: ***  CDAI Exam: CDAI Score: -- Patient Global: --; Provider Global: -- Swollen: --; Tender: -- Joint Exam 02/22/2023   No joint exam has been documented for this visit   There is currently no information documented on the homunculus. Go to the Rheumatology activity and complete the homunculus joint exam.  Investigation: No additional findings.  Imaging: No results found.  Recent Labs: Lab Results  Component Value Date   WBC 8.0 09/20/2022   HGB 11.9 (L) 09/20/2022   PLT 231 09/20/2022   NA 136 09/20/2022   K 4.0  09/20/2022   CL 101 09/20/2022   CO2 25 09/20/2022   GLUCOSE 106 (H) 09/20/2022   BUN 12 09/20/2022   CREATININE 0.84 09/20/2022   BILITOT 0.3 07/22/2022   ALKPHOS 69 07/22/2022   AST 16 07/22/2022   ALT 14 07/22/2022   PROT 6.7 07/22/2022   ALBUMIN 4.0 07/22/2022   CALCIUM 9.1 09/20/2022   GFRAA >60 05/18/2017    Speciality Comments: No specialty comments available.  Procedures:  No procedures performed Allergies: Doxycycline, Amoxicillin-pot clavulanate, and Penicillins   Assessment / Plan:     Visit Diagnoses: No diagnosis found.  ***  Orders: No orders of the defined types were placed in this encounter.  No orders of the defined types were placed in this encounter.    Follow-Up Instructions: No follow-ups on file.   Metta Clines, RT  Note - This record has been created using [employer].  Chart creation errors have been sought, but may not always  have been located. Such creation errors do not reflect on  the standard of medical care.

## 2023-02-14 ENCOUNTER — Ambulatory Visit: Payer: 59 | Admitting: Obstetrics

## 2023-02-20 DIAGNOSIS — N3010 Interstitial cystitis (chronic) without hematuria: Secondary | ICD-10-CM | POA: Insufficient documentation

## 2023-02-21 ENCOUNTER — Encounter: Payer: Self-pay | Admitting: Obstetrics

## 2023-02-22 ENCOUNTER — Ambulatory Visit: Payer: BC Managed Care – PPO | Admitting: Internal Medicine

## 2023-02-22 DIAGNOSIS — R768 Other specified abnormal immunological findings in serum: Secondary | ICD-10-CM

## 2023-02-22 DIAGNOSIS — I7300 Raynaud's syndrome without gangrene: Secondary | ICD-10-CM

## 2023-02-22 DIAGNOSIS — E611 Iron deficiency: Secondary | ICD-10-CM

## 2023-02-22 DIAGNOSIS — R21 Rash and other nonspecific skin eruption: Secondary | ICD-10-CM

## 2023-03-01 ENCOUNTER — Ambulatory Visit: Payer: 59 | Admitting: Licensed Practical Nurse

## 2023-03-09 ENCOUNTER — Ambulatory Visit (INDEPENDENT_AMBULATORY_CARE_PROVIDER_SITE_OTHER): Payer: 59 | Admitting: Obstetrics

## 2023-03-09 ENCOUNTER — Other Ambulatory Visit (HOSPITAL_COMMUNITY)
Admission: RE | Admit: 2023-03-09 | Discharge: 2023-03-09 | Disposition: A | Discharge status: Home / Self Care | Source: Ambulatory Visit | Attending: Obstetrics | Admitting: Obstetrics

## 2023-03-09 ENCOUNTER — Encounter: Payer: Self-pay | Admitting: Obstetrics

## 2023-03-09 VITALS — BP 133/87 | HR 113 | Ht 63.0 in | Wt 186.0 lb

## 2023-03-09 DIAGNOSIS — Z124 Encounter for screening for malignant neoplasm of cervix: Secondary | ICD-10-CM

## 2023-03-09 DIAGNOSIS — Z113 Encounter for screening for infections with a predominantly sexual mode of transmission: Secondary | ICD-10-CM | POA: Diagnosis present

## 2023-03-09 DIAGNOSIS — Z30432 Encounter for removal of intrauterine contraceptive device: Secondary | ICD-10-CM

## 2023-03-09 DIAGNOSIS — Z01419 Encounter for gynecological examination (general) (routine) without abnormal findings: Secondary | ICD-10-CM | POA: Diagnosis not present

## 2023-03-09 DIAGNOSIS — N941 Unspecified dyspareunia: Secondary | ICD-10-CM | POA: Diagnosis not present

## 2023-03-09 DIAGNOSIS — Z0000 Encounter for general adult medical examination without abnormal findings: Secondary | ICD-10-CM

## 2023-03-09 LAB — POCT URINALYSIS DIPSTICK OB
Appearance: NEGATIVE
Bilirubin, UA: NEGATIVE
Blood, UA: NEGATIVE
Glucose, UA: NEGATIVE
Ketones, UA: NEGATIVE
Leukocytes, UA: NEGATIVE
Nitrite, UA: NEGATIVE
Odor: NEGATIVE
POC,PROTEIN,UA: NEGATIVE
Spec Grav, UA: 1.015 (ref 1.010–1.025)
Urobilinogen, UA: 0.2 U/dL
pH, UA: 7.5 (ref 5.0–8.0)

## 2023-03-09 NOTE — Progress Notes (Signed)
 ANNUAL GYNECOLOGICAL EXAM  SUBJECTIVE  HPI  Angela Rocha is a 28 y.o.-year-old G0P0000 who presents for an annual gynecological exam today.  She denies pelvic pain, dyspareunia, abnormal vaginal bleeding or discharge. She had been having dyspareunia but it improved with the medication her urologist started her on. She continues to have frequent UTIs and has a biopsy scheduled soon for suspected interstitial cystitis. Her urologist recommended removing her IUD, and she would like that done today.  Medical/Surgical History Past Medical History:  Diagnosis Date   ADHD    Anxiety    Asthma    B12 deficiency    Femoral neck stress fracture    GERD (gastroesophageal reflux disease)    Hip dysplasia    History of dysplastic nevus 03/04/2020   right upper back, severe/excision   Migraine    Pelvic congestion syndrome    Pyelonephritis    Recurrent upper respiratory infection (URI)    Urticaria    Past Surgical History:  Procedure Laterality Date   ADENOIDECTOMY     COLONOSCOPY WITH PROPOFOL N/A 03/23/2020   Procedure: COLONOSCOPY WITH PROPOFOL;  Surgeon: Toney Reil, MD;  Location: [facility] ENDOSCOPY;  Service: Gastroenterology;  Laterality: N/A;   HIP SURGERY     HARDWARE REMOVAL X2   PERIACETUBLAR Bilateral    OSTEOTOMY   TONSILLECTOMY     TYMPANOSTOMY TUBE PLACEMENT      Social History Lives with husband.  Work: Theatre stage manager, works nights on [employer] Exercise: Pilates Substances: Rare EtOH, tobacco, vape, and recreational drugs  Obstetric History OB History     Gravida  0   Para  0   Term  0   Preterm  0   AB  0   Living  0      SAB  0   IAB  0   Ectopic  0   Multiple  0   Live Births  0            GYN/Menstrual History No LMP recorded. (Menstrual status: IUD). Regular light periods with heavy cramping Last Pap: 12/2019, NILM Contraception: IUD, plans condoms after removal   Prevention Mammogram: at 42 Colonoscopy: at  47  Current Medications Outpatient Medications Prior to Visit  Medication Sig   Adapalene (DIFFERIN) 0.3 % gel Apply to face at bedtime, wash off in morning.   albuterol (VENTOLIN HFA) 108 (90 Base) MCG/ACT inhaler Inhale 2 puffs into the lungs every 6 (six) hours as needed for wheezing or shortness of breath.   busPIRone (BUSPAR) 15 MG tablet Take 20 mg by mouth in the morning, at noon, and at bedtime.   cetirizine (ZYRTEC) 10 MG tablet Take 10 mg by mouth daily.   clobetasol (TEMOVATE) 0.05 % external solution Apply to aa's scalp QD PRN. Avoid the face, groin, and underarms.   cyanocobalamin (,VITAMIN B-12,) 1000 MCG/ML injection INJECT 1 ML (1,000 MCG TOTAL) INTO THE MUSCLE EVERY 30 (THIRTY) DAYS.   fluticasone (FLONASE) 50 MCG/ACT nasal spray Place 1 spray into both nostrils 2 (two) times daily as needed for allergies.   gabapentin (NEURONTIN) 400 MG capsule Take 400 mg by mouth 2 (two) times daily.   Galcanezumab-gnlm (EMGALITY) 120 MG/ML SOAJ Inject into the skin.   montelukast (SINGULAIR) 10 MG tablet Take 10 mg by mouth at bedtime.   ondansetron (ZOFRAN-ODT) 4 MG disintegrating tablet Take 1 tablet (4 mg total) by mouth every 8 (eight) hours as needed for nausea or vomiting.   pentosan polysulfate (ELMIRON) 100  MG capsule Take 100 mg by mouth in the morning, at noon, and at bedtime.   QELBREE 200 MG 24 hr capsule Take 400 mg by mouth daily.   rizatriptan (MAXALT-MLT) 10 MG disintegrating tablet Take 10 mg by mouth as directed. TAKE 1 TAB AT HEADACHE ONSET MAY TAKE A 2ND DOSE AFTER 2 HOURS IF NEEDED NO MORE THAN 2 DOSES/24 HRS   sertraline (ZOLOFT) 100 MG tablet Take 150 mg by mouth daily.   valACYclovir (VALTREX) 500 MG tablet Take 1 tablet (500 mg total) by mouth 2 (two) times daily. (Patient taking differently: Take 500 mg by mouth 2 (two) times daily as needed (Cold Sores).)   amphetamine-dextroamphetamine (ADDERALL XR) 25 MG 24 hr capsule Take 25 mg by mouth daily.   AZSTARYS  39.2-7.8 MG CAPS Take by mouth.   HYDROcodone-acetaminophen (NORCO) 5-325 MG tablet Take 1 tablet by mouth every 6 (six) hours as needed for moderate pain (pain score 4-6).   levonorgestrel (MIRENA) 20 MCG/24HR IUD by Intrauterine route. (Patient not taking: Reported on 03/09/2023)   nitrofurantoin (MACRODANTIN) 50 MG capsule Take 1 capsule (50 mg total) by mouth daily. (Patient not taking: Reported on 12/14/2022)   nortriptyline (PAMELOR) 10 MG capsule Start Nortriptyline (Pamelor) 10 mg nightly for one week, then increase to 20 mg nightly   phenazopyridine (PYRIDIUM) 200 MG tablet Take 1 tablet (200 mg total) by mouth 3 (three) times daily as needed for pain.   prazosin (MINIPRESS) 1 MG capsule Take 2 mg by mouth daily.   promethazine-dextromethorphan (PROMETHAZINE-DM) 6.25-15 MG/5ML syrup Take 5 mLs by mouth 4 (four) times daily as needed.   propranolol (INDERAL) 10 MG tablet Take 10 mg by mouth daily.   tiZANidine (ZANAFLEX) 4 MG tablet Take 4 mg by mouth at bedtime.   traZODone (DESYREL) 50 MG tablet Take 50 mg by mouth at bedtime.   No facility-administered medications prior to visit.      [facility] - 03/09/23 1340       Pregnancy Intention Screening   Does the patient want to become pregnant in the next year? No    Does the patient's partner want to become pregnant in the next year? No    Would the patient like to discuss contraceptive options today? No      Contraception Wrap Up   Current Method IUD or IUS    Contraception Counseling Provided Yes    How was the end contraceptive method provided? N/A             ROS Constitutional: Denied constitutional symptoms, night sweats, recent illness, fatigue, fever, insomnia and weight loss.  Eyes: Denied eye symptoms, eye pain, photophobia, vision change and visual disturbance.  Ears/Nose/Throat/Neck: Denied ear, nose, throat or neck symptoms, hearing loss, nasal discharge, sinus congestion and sore throat.  Cardiovascular: Denied  cardiovascular symptoms, arrhythmia, chest pain/pressure, edema, exercise intolerance, orthopnea and palpitations.  Respiratory: Denied pulmonary symptoms, asthma, pleuritic pain, productive sputum, cough, dyspnea and wheezing.  Gastrointestinal: Denied, gastro-esophageal reflux, melena, nausea and vomiting.  Genitourinary: See HPI  Musculoskeletal: Denied musculoskeletal symptoms, stiffness, swelling, muscle weakness and myalgia.  Dermatologic: Denied dermatology symptoms, rash and scar.  Neurologic: Denied neurology symptoms, dizziness, headache, neck pain and syncope.  Psychiatric: Denied psychiatric symptoms, anxiety and depression.  Endocrine: Denied endocrine symptoms including hot flashes and night sweats.    OBJECTIVE  BP 133/87   Pulse (!) 113   Ht 5\' 3"  (1.6 m)   Wt 186 lb (84.4 kg)   BMI 32.95  kg/m    Physical examination General NAD, Conversant  HEENT Atraumatic; Op clear with mmm.  Normo-cephalic. Pupils reactive. Anicteric sclerae  Thyroid/Neck Smooth without nodularity or enlargement. Normal ROM.  Neck Supple.  Skin No rashes, lesions or ulceration. Normal palpated skin turgor. No nodularity.  Breasts: No masses or discharge.  Symmetric.  No axillary adenopathy.  Lungs: Clear to auscultation.No rales or wheezes. Normal Respiratory effort, no retractions.  Heart: NSR.  No murmurs or rubs appreciated. No peripheral edema  Abdomen: Soft.  Non-tender.  No masses.  No HSM. No hernia  Extremities: Moves all appropriately.  Normal ROM for age. No lymphadenopathy.  Neuro: Oriented to PPT.  Normal mood. Normal affect.     Pelvic:   Vulva: Normal appearance.  No lesions.  Vagina: No lesions or abnormalities noted.  Support: Normal pelvic support.  Urethra No masses tenderness or scarring.  Meatus Normal size without lesions or prolapse.  Cervix: Normal appearance.  No lesions. IUD strings visualized. Pap collected.  Anus: Normal exam.  No lesions.  Perineum: Normal exam.   No lesions.   Procedure Note: Cervix visualized with speculum. IUD strings visible at os. Strings grasped with ring forceps and intact IUD easily removed with gentle traction. There was minimal bleeding. Afsana tolerated this well.  ASSESSMENT  1) Annual exam 2) Pap due 3) IUD removal  PLAN 1) Physical exam as noted. Discussed healthy lifestyle choices and preventive care. STI testing and routine labs today. UA and UC done for TOC at pt request. 2) Pap collected. F/u based on results. 3) Return for contraceptive counseling after urology appointment.  Return in one year for annual exam or as needed for concerns.   Guadlupe Spanish, CNM

## 2023-03-10 LAB — LIPID PANEL
Chol/HDL Ratio: 4.2 ratio (ref 0.0–4.4)
Cholesterol, Total: 199 mg/dL (ref 100–199)
HDL: 47 mg/dL (ref >39)
LDL Chol Calc (NIH): 129 mg/dL — ABNORMAL HIGH (ref 0–99)
Triglycerides: 130 mg/dL (ref 0–149)
VLDL Cholesterol Cal: 23 mg/dL (ref 5–40)

## 2023-03-10 LAB — CBC WITH DIFF/PLATELET
Basophils Absolute: 0 10*3/uL (ref 0.0–0.2)
Basos: 1 %
EOS (ABSOLUTE): 0.1 10*3/uL (ref 0.0–0.4)
Eos: 1 %
Hematocrit: 36.2 % (ref 34.0–46.6)
Hemoglobin: 12 g/dL (ref 11.1–15.9)
Immature Grans (Abs): 0 10*3/uL (ref 0.0–0.1)
Immature Granulocytes: 0 %
Lymphocytes Absolute: 1.5 10*3/uL (ref 0.7–3.1)
Lymphs: 33 %
MCH: 31 pg (ref 26.6–33.0)
MCHC: 33.1 g/dL (ref 31.5–35.7)
MCV: 94 fL (ref 79–97)
Monocytes Absolute: 0.4 10*3/uL (ref 0.1–0.9)
Monocytes: 8 %
Neutrophils Absolute: 2.6 10*3/uL (ref 1.4–7.0)
Neutrophils: 57 %
Platelets: 222 10*3/uL (ref 150–450)
RBC: 3.87 x10E6/uL (ref 3.77–5.28)
RDW: 12.1 % (ref 11.7–15.4)
WBC: 4.6 10*3/uL (ref 3.4–10.8)

## 2023-03-10 LAB — COMPREHENSIVE METABOLIC PANEL
ALT: 16 IU/L (ref 0–32)
AST: 16 IU/L (ref 0–40)
Albumin: 4.1 g/dL (ref 4.0–5.0)
Alkaline Phosphatase: 96 IU/L (ref 44–121)
BUN/Creatinine Ratio: 12 (ref 9–23)
BUN: 9 mg/dL (ref 6–20)
Bilirubin Total: 0.2 mg/dL (ref 0.0–1.2)
CO2: 23 mmol/L (ref 20–29)
Calcium: 9.1 mg/dL (ref 8.7–10.2)
Chloride: 104 mmol/L (ref 96–106)
Creatinine, Ser: 0.77 mg/dL (ref 0.57–1.00)
Globulin, Total: 2.1 g/dL (ref 1.5–4.5)
Glucose: 91 mg/dL (ref 70–99)
Potassium: 4.1 mmol/L (ref 3.5–5.2)
Sodium: 140 mmol/L (ref 134–144)
Total Protein: 6.2 g/dL (ref 6.0–8.5)
eGFR: 110 mL/min/{1.73_m2} (ref >59)

## 2023-03-10 LAB — TSH+FREE T4
Free T4: 1.07 ng/dL (ref 0.82–1.77)
TSH: 1.59 u[IU]/mL (ref 0.450–4.500)

## 2023-03-10 LAB — HEMOGLOBIN A1C
Est. average glucose Bld gHb Est-mCnc: 97 mg/dL
Hgb A1c MFr Bld: 5 % (ref 4.8–5.6)

## 2023-03-10 LAB — HEP, RPR, HIV PANEL
HIV Screen 4th Generation wRfx: NONREACTIVE
Hepatitis B Surface Ag: NEGATIVE
RPR Ser Ql: NONREACTIVE

## 2023-03-11 LAB — URINE CULTURE

## 2023-03-14 LAB — CYTOLOGY - PAP
Chlamydia: NEGATIVE
Comment: NEGATIVE
Comment: NEGATIVE
Comment: NORMAL
Diagnosis: NEGATIVE
Neisseria Gonorrhea: NEGATIVE
Trichomonas: NEGATIVE

## 2023-03-15 ENCOUNTER — Encounter: Payer: Self-pay | Admitting: Obstetrics

## 2023-04-29 ENCOUNTER — Emergency Department
Admission: EM | Admit: 2023-04-29 | Discharge: 2023-04-29 | Disposition: A | Discharge status: Home / Self Care | Attending: Emergency Medicine | Admitting: Emergency Medicine

## 2023-04-29 ENCOUNTER — Emergency Department

## 2023-04-29 ENCOUNTER — Other Ambulatory Visit: Payer: Self-pay

## 2023-04-29 DIAGNOSIS — F419 Anxiety disorder, unspecified: Secondary | ICD-10-CM | POA: Diagnosis not present

## 2023-04-29 DIAGNOSIS — R079 Chest pain, unspecified: Secondary | ICD-10-CM | POA: Diagnosis not present

## 2023-04-29 DIAGNOSIS — R000 Tachycardia, unspecified: Secondary | ICD-10-CM | POA: Diagnosis present

## 2023-04-29 LAB — CBC
HCT: 37 % (ref 36.0–46.0)
Hemoglobin: 12.4 g/dL (ref 12.0–15.0)
MCH: 30.5 pg (ref 26.0–34.0)
MCHC: 33.5 g/dL (ref 30.0–36.0)
MCV: 90.9 fL (ref 80.0–100.0)
Platelets: 225 10*3/uL (ref 150–400)
RBC: 4.07 MIL/uL (ref 3.87–5.11)
RDW: 12.6 % (ref 11.5–15.5)
WBC: 9.3 10*3/uL (ref 4.0–10.5)
nRBC: 0 % (ref 0.0–0.2)

## 2023-04-29 LAB — BASIC METABOLIC PANEL WITH GFR
Anion gap: 8 (ref 5–15)
BUN: 16 mg/dL (ref 6–20)
CO2: 26 mmol/L (ref 22–32)
Calcium: 9 mg/dL (ref 8.9–10.3)
Chloride: 101 mmol/L (ref 98–111)
Creatinine, Ser: 0.8 mg/dL (ref 0.44–1.00)
GFR, Estimated: >60 mL/min (ref >60)
Glucose, Bld: 104 mg/dL — ABNORMAL HIGH (ref 70–99)
Potassium: 4.1 mmol/L (ref 3.5–5.1)
Sodium: 135 mmol/L (ref 135–145)

## 2023-04-29 LAB — D-DIMER, QUANTITATIVE: D-Dimer, Quant: 0.56 ug{FEU}/mL — ABNORMAL HIGH (ref 0.00–0.50)

## 2023-04-29 LAB — POC URINE PREG, ED: Preg Test, Ur: NEGATIVE

## 2023-04-29 LAB — TROPONIN I (HIGH SENSITIVITY)
Troponin I (High Sensitivity): 3 ng/L (ref <18)
Troponin I (High Sensitivity): <2 ng/L (ref <18)

## 2023-04-29 MED ORDER — CLOTRIMAZOLE 1 % EX CREA: 1.0000 | TOPICAL_CREAM | Freq: Two times a day (BID) | CUTANEOUS | 0 refills | Status: AC

## 2023-04-29 MED ORDER — KETOROLAC TROMETHAMINE 15 MG/ML IJ SOLN
15.0000 mg | Freq: Once | INTRAMUSCULAR | Status: AC
  Administered 2023-04-29: 15 mg via INTRAVENOUS
  Filled 2023-04-29: qty 1

## 2023-04-29 MED ORDER — HYDROXYZINE HCL 10 MG PO TABS
10.0000 mg | ORAL_TABLET | Freq: Once | ORAL | Status: AC
  Administered 2023-04-29: 10 mg via ORAL
  Filled 2023-04-29: qty 1

## 2023-04-29 MED ORDER — ALUM & MAG HYDROXIDE-SIMETH 200-200-20 MG/5ML PO SUSP
30.0000 mL | Freq: Once | ORAL | Status: AC
  Administered 2023-04-29: 30 mL via ORAL
  Filled 2023-04-29: qty 30

## 2023-04-29 MED ORDER — IOHEXOL 350 MG/ML SOLN
75.0000 mL | Freq: Once | INTRAVENOUS | Status: AC | PRN
  Administered 2023-04-29: 75 mL via INTRAVENOUS

## 2023-04-29 MED ORDER — KETOROLAC TROMETHAMINE 10 MG PO TABS: 10.0000 mg | ORAL_TABLET | Freq: Three times a day (TID) | ORAL | 0 refills | Status: DC | PRN

## 2023-04-29 MED ORDER — LIDOCAINE VISCOUS HCL 2 % MT SOLN
15.0000 mL | Freq: Once | OROMUCOSAL | Status: AC
  Administered 2023-04-29: 15 mL via ORAL
  Filled 2023-04-29: qty 15

## 2023-04-29 NOTE — ED Triage Notes (Addendum)
 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 Staff called this RN and advised her HR was extremely high at 150 and she felt sick. Pt is caox4, in no acute distress. Pt is dizzy upon standing. Pt had procedure on 4/4 biopsy of the bladder and other procedure. Pt took a propranalol around 130 am to try and help her high HR.

## 2023-04-29 NOTE — ED Notes (Signed)
 Pt transported to CT at this time.

## 2023-04-29 NOTE — ED Notes (Signed)
 Pt aox4 cleared and discharged. All discharge teachings given to pt. Pt verbalizes back understanding of teachings. Departed with husband

## 2023-04-29 NOTE — Discharge Instructions (Signed)
 Please follow up with your primary care doctor and discuss having a cardiac monitor placed to monitor your heart rhythm.

## 2023-04-29 NOTE — ED Provider Notes (Signed)
 [facility] Provider Note    Event Date/Time   First MD Initiated Contact with Patient 04/29/23 [phone]     (approximate)   History   Tachycardia   HPI  Angela Rocha is a 28 y.o. female who presents to the emergency department today because of concerns for fast heart rate.  Patient has been having episodes of tachycardia over the past roughly 3 to 4 days.  She states that she will be alerted to her fast heart rate because of some discomfort in her chest and her smart watch will tell her her heart rate is above 120.'s episodes will last for about 30 minutes.  The patient was at work today when the symptoms started.  She also has some anxiety and thinks that some of her anxiety may be causing the issue.  She denies any fevers.   Physical Exam   Triage Vital Signs: ED Triage Vitals  Encounter Vitals Group     BP 04/29/23 0250 125/86     Systolic BP Percentile --      Diastolic BP Percentile --      Pulse Rate 04/29/23 0249 (!) 115     Resp 04/29/23 0249 18     Temp 04/29/23 0249 98.4 F (36.9 C)     Temp Source 04/29/23 0249 Oral     SpO2 04/29/23 0249 100 %     Weight --      Height 04/29/23 0246 5\' 3"  (1.6 m)     Head Circumference --      Peak Flow --      Pain Score 04/29/23 0246 4     Pain Loc --      Pain Education --      Exclude from Growth Chart --     Most recent vital signs: Vitals:   04/29/23 0249 04/29/23 0250  BP:  125/86  Pulse: (!) 115 (!) 105  Resp: 18   Temp: 98.4 F (36.9 C)   SpO2: 100% 98%   General: Awake, alert, oriented. CV:  Good peripheral perfusion. Tachycardia. Resp:  Normal effort. Lungs clear. Abd:  No distention. Non tender. Other:  Circular rash to stomach.   ED Results / Procedures / Treatments   Labs (all labs ordered are listed, but only abnormal results are displayed) Labs Reviewed  BASIC METABOLIC PANEL WITH GFR - Abnormal; Notable for the following components:      Result Value   Glucose, Bld  104 (*)    All other components within normal limits  D-DIMER, QUANTITATIVE - Abnormal; Notable for the following components:   D-Dimer, Quant 0.56 (*)    All other components within normal limits  CBC  POC URINE PREG, ED  TROPONIN I (HIGH SENSITIVITY)  TROPONIN I (HIGH SENSITIVITY)     EKG  I, Marylynn Soho, attending physician, personally viewed and interpreted this EKG  EKG Time: 0252 Rate: 107 Rhythm: sinus tachycardia Axis: normal Intervals: qtc 473 QRS: narrow, RSR' in V1, v2 ST changes: no st elevation Impression: abnormal ekg   RADIOLOGY I independently interpreted and visualized the CT angio PE. My interpretation: No large PE Radiology interpretation:  IMPRESSION:  Negative for acute pulmonary embolus. Negative chest CTA aside from  atelectasis.    I independently interpreted and visualized the CXR. My interpretation: No pneumonia Radiology interpretation:  IMPRESSION:  No active cardiopulmonary disease.      PROCEDURES:  Critical Care performed: No   MEDICATIONS ORDERED IN ED: Medications - No  data to display   IMPRESSION / MDM / ASSESSMENT AND PLAN / ED COURSE  I reviewed the triage vital signs and the nursing notes.                              Differential diagnosis includes, but is not limited to, dehydration, arrhythmia, PE, ACS, electrolyte abnormality, pleurisy  Patient's presentation is most consistent with acute presentation with potential threat to life or bodily function.   The patient is on the cardiac monitor to evaluate for evidence of arrhythmia and/or significant heart rate changes.  Patient presented to the emergency department today because of concerns for tachycardia.  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  The time my exam patient's heart rate has improved over reported rate upstairs.  EKG shows a sinus rhythm.  Blood work without concerning anemia or electrolyte abnormalities.  No concerning findings for  significant dehydration.  Panel was negative.  D-dimer was slightly elevated so CT angio was obtained.  This did not show any PE or contributing finding.  Did try GI cocktail which did not change the patient's discomfort.  She was then given Toradol  which she stated did help.  At this time I do wonder if patient is suffering from inflammation of the chest wall or pleurisy.  Discussed this with the patient.  Will plan on discharging with Toradol .  In addition patient had a circular rash on her abdomen.  Will give prescription for topical fungal medication.      FINAL CLINICAL IMPRESSION(S) / ED DIAGNOSES   Final diagnoses:  Tachycardia  Nonspecific chest pain     Note:  This document was prepared using Dragon voice recognition software and may include unintentional dictation errors.    Marylynn Soho, MD 04/29/23 [phone]

## 2023-05-05 ENCOUNTER — Ambulatory Visit: Payer: 59 | Admitting: Obstetrics

## 2023-05-09 ENCOUNTER — Ambulatory Visit: Admitting: Obstetrics

## 2023-05-11 NOTE — Telephone Encounter (Signed)
See telephone encounter documentation

## 2023-05-12 ENCOUNTER — Ambulatory Visit (INDEPENDENT_AMBULATORY_CARE_PROVIDER_SITE_OTHER)

## 2023-05-12 VITALS — BP 115/80 | HR 109 | Ht 63.0 in | Wt 200.4 lb

## 2023-05-12 DIAGNOSIS — Z3200 Encounter for pregnancy test, result unknown: Secondary | ICD-10-CM

## 2023-05-12 DIAGNOSIS — N912 Amenorrhea, unspecified: Secondary | ICD-10-CM

## 2023-05-12 DIAGNOSIS — O209 Hemorrhage in early pregnancy, unspecified: Secondary | ICD-10-CM

## 2023-05-12 DIAGNOSIS — Z3202 Encounter for pregnancy test, result negative: Secondary | ICD-10-CM | POA: Diagnosis not present

## 2023-05-12 LAB — POCT URINE PREGNANCY: Preg Test, Ur: NEGATIVE

## 2023-05-12 NOTE — Progress Notes (Signed)
    NURSE VISIT NOTE  Subjective:    Patient ID: Angela Rocha, female    DOB: July 26, 1995, 26 y.o.   MRN: [id number]  HPI  Patient is a 28 y.o. G0P0000 female who presents for evaluation of amenorrhea. She believes she could be pregnant. Pregnancy is desired. Sexual Activity: single partner, contraception: none. Current symptoms also include: breast tenderness, fatigue, frequent urination, nausea, and positive home pregnancy test. Last period was normal. She reports having 2 positive home tests earlier this week but began to spot and bleed this morning. States slight  dull cramping.    Objective:    Ht 5\' 3"  (1.6 m)   Wt 200 lb 6.4 oz (90.9 kg)   LMP 04/16/2023   BMI 35.50 kg/m   Lab Review  No results found for any visits on 05/12/23.  Assessment:   1. Possible pregnancy, not yet confirmed   2. Vaginal bleeding affecting early pregnancy     Plan:    Pregnancy Test: @PLANEND @Negative   Serial Beta Quant ordered per  Josue Nip, CNM. Patient will check my chart for results.   Vale Garrison, CMA

## 2023-05-13 LAB — BETA HCG QUANT (REF LAB): hCG Quant: <1 m[IU]/mL

## 2023-05-15 ENCOUNTER — Other Ambulatory Visit

## 2023-05-15 DIAGNOSIS — Z3200 Encounter for pregnancy test, result unknown: Secondary | ICD-10-CM

## 2023-05-15 DIAGNOSIS — O209 Hemorrhage in early pregnancy, unspecified: Secondary | ICD-10-CM

## 2023-05-16 ENCOUNTER — Telehealth: Payer: Self-pay | Admitting: Obstetrics

## 2023-05-16 LAB — BETA HCG QUANT (REF LAB): hCG Quant: <1 m[IU]/mL

## 2023-05-16 NOTE — Telephone Encounter (Signed)
 Called Eric to discuss bhCG results. Possible false positive HPT vs chemical pregnancy. No further questions at this time. She has a visit scheduled 05/22/23.  Haron Beilke M Jae Skeet, CNM

## 2023-05-22 ENCOUNTER — Encounter: Payer: Self-pay | Admitting: Obstetrics

## 2023-05-22 ENCOUNTER — Ambulatory Visit (INDEPENDENT_AMBULATORY_CARE_PROVIDER_SITE_OTHER): Admitting: Obstetrics

## 2023-05-22 VITALS — BP 116/84 | HR 141 | Ht 63.0 in | Wt 197.0 lb

## 2023-05-22 DIAGNOSIS — Z3169 Encounter for other general counseling and advice on procreation: Secondary | ICD-10-CM

## 2023-05-22 NOTE — Progress Notes (Signed)
   GYN ENCOUNTER  Subjective  HPI: Angela Rocha is a 28 y.o. G1P0010 who presents today for preconception counseling. She had a chemical pregnancy recently and is interested in conceiving again. She is on several medications and would like to know if they are safe. She has met with her psychiatrist and is figuring out which medications she should be on.  Past Medical History:  Diagnosis Date   ADHD    Anxiety    Asthma    B12 deficiency    Femoral neck stress fracture    GERD (gastroesophageal reflux disease)    Hip dysplasia    History of dysplastic nevus 03/04/2020   right upper back, severe/excision   Migraine    Pelvic congestion syndrome    Pyelonephritis    Recurrent upper respiratory infection (URI)    Urticaria    Past Surgical History:  Procedure Laterality Date   ADENOIDECTOMY     COLONOSCOPY WITH PROPOFOL  N/A 03/23/2020   Procedure: COLONOSCOPY WITH PROPOFOL ;  Surgeon: Selena Daily, MD;  Location: [facility];  Service: Gastroenterology;  Laterality: N/A;   HIP SURGERY     HARDWARE REMOVAL X2   PERIACETUBLAR Bilateral    OSTEOTOMY   TONSILLECTOMY     TYMPANOSTOMY TUBE PLACEMENT     OB History     Gravida  1   Para  0   Term  0   Preterm  0   AB  1   Living  0      SAB  1   IAB  0   Ectopic  0   Multiple  0   Live Births  0          Allergies  Allergen Reactions   Doxycycline Nausea And Vomiting    nausea   Amoxicillin-Pot Clavulanate Rash and Other (See Comments)    Has patient had a PCN reaction causing immediate rash, facial/tongue/throat swelling, SOB or lightheadedness with hypotension: Yes Has patient had a PCN reaction causing severe rash involving mucus membranes or skin necrosis: No Has patient had a PCN reaction that required hospitalization: No Has patient had a PCN reaction occurring within the last 10 years: No If all of the above answers are "NO", then may proceed with Cephalosporin use.    Penicillins  Rash    ROS: See HPI   Objective  BP 116/84   Pulse (!) 141   Ht 5\' 3"  (1.6 m)   Wt 197 lb (89.4 kg)   LMP 04/16/2023   BMI 34.90 kg/m   Physical examination No medically indicated  Assessment -Preconception counseling  Plan -Reviewed medications. Several are newer meds with limited pregnancy data. Discussed risk/benefit profiles.  -Reviewed risks of untreated depression/anxiety versus medication and importance of adequately treating to minimize exposures. -Discussed timing of intercourse, ovulation, prenatal vitamin with folic acid, healthy lifestyle choices -RTC with positive pregnancy test or if pregnancy is not achieved after one year of timed intercourse   Josue Nip, CNM

## 2023-08-04 ENCOUNTER — Telehealth: Payer: Self-pay

## 2023-08-04 NOTE — Telephone Encounter (Signed)
 Pt calling traige stating her lmp was 07/07/2023 stated she had a positve and negative pregnancy test i let her know it would be to early to test advised to wait untill 6-10 days missed period, she stated she would then let us  know then we would proceeded with apts .

## 2023-09-13 ENCOUNTER — Ambulatory Visit
Admission: EM | Admit: 2023-09-13 | Discharge: 2023-09-13 | Disposition: A | Discharge status: Home / Self Care | Attending: Family Medicine | Admitting: Family Medicine

## 2023-09-13 DIAGNOSIS — N3010 Interstitial cystitis (chronic) without hematuria: Secondary | ICD-10-CM | POA: Diagnosis not present

## 2023-09-13 DIAGNOSIS — N390 Urinary tract infection, site not specified: Secondary | ICD-10-CM | POA: Insufficient documentation

## 2023-09-13 DIAGNOSIS — R300 Dysuria: Secondary | ICD-10-CM | POA: Diagnosis not present

## 2023-09-13 LAB — URINALYSIS, W/ REFLEX TO CULTURE (INFECTION SUSPECTED): Squamous Epithelial / HPF: NONE SEEN /HPF (ref 0–5)

## 2023-09-13 MED ORDER — NITROFURANTOIN MONOHYD MACRO 100 MG PO CAPS: 100.0000 mg | ORAL_CAPSULE | Freq: Two times a day (BID) | ORAL | 0 refills | Status: DC

## 2023-09-13 NOTE — Discharge Instructions (Signed)
 Under the microscope it appears your could have a urinary tract infection. I sent your urine for culture to be sure the antibiotic prescribed will treat your potential infection. Someone may call you to stop or change antibiotics. Stop by the pharmacy to pick up your prescription.  Follow up with your gynecologist, urologist or return to the urgent care, if not improving.

## 2023-09-13 NOTE — ED Provider Notes (Signed)
 [facility]    CSN: [id number] Arrival date & time: 09/13/23  0806      History   Chief Complaint Chief Complaint  Patient presents with   Dysuria     HPI HPI Angela Rocha is a 28 y.o. female.    Angela Rocha presents for 1 day of dysuria, urinary frequency and lower abdominal pain.   Tried pyridinium, Elmeron and AZO prior to arrival.   No not had any antibiotics in last 30 days but had a yeast infection 2 weeks ago.   Denies known STI exposure. She is  not currently pregnant.  Patient's last menstrual period was 09/07/2023 (approximate).   Reports history of recurrent UTIs and interstitial cystitis.     - Abnormal vaginal discharge: no - vaginal odor: no - vaginal bleeding: no - Dysuria: yes  - Hematuria: no - Urinary urgency: yes  - Urinary frequency: yes - Fever: no - Abdominal pain: yes  - Pelvic pain: yes  - Rash/Skin lesions/mouth ulcers: no - Nausea: no  - Vomiting: no  - Back Pain: yes        Past Medical History:  Diagnosis Date   ADHD    Anxiety    Asthma    B12 deficiency    Femoral neck stress fracture    GERD (gastroesophageal reflux disease)    Hip dysplasia    History of dysplastic nevus 03/04/2020   right upper back, severe/excision   Migraine    Pelvic congestion syndrome    Pyelonephritis    Recurrent upper respiratory infection (URI)    Urticaria     Patient Active Problem List   Diagnosis Date Noted   Recurrent UTI 09/23/2022   ESBL (extended spectrum beta-lactamase) producing bacteria infection 09/20/2022   Iron deficiency 08/22/2022   Raynaud's syndrome without gangrene 08/22/2022   Positive ANA (antinuclear antibody) 07/25/2022   Facial rash 07/25/2022   Other fatigue 07/25/2022   Frequent infections 07/25/2022   Obesity (BMI 30-39.9) 03/09/2022   Family history of colon cancer 08/02/2021   Sprain of left ankle 01/18/2021   Pain from implanted hardware 03/23/2020   History of colon cancer    Asthma,  not well controlled 02/12/2020   History of frequent upper respiratory infection 02/12/2020   Adverse reaction to food, subsequent encounter 02/12/2020   Drug reaction 02/12/2020   Heartburn 02/12/2020   History of systemic reaction to hymenoptera sting 02/12/2020   History of urticaria 02/12/2020   Numbness and tingling in left arm 01/16/2020   Other allergic rhinitis 01/08/2020   Migraine without aura and without status migrainosus, not intractable 08/23/2017   Generalized anxiety disorder 05/22/2017   Other acquired deformity of hip 05/22/2017   Pyelonephritis 05/17/2017   Attention deficit hyperactivity disorder (ADHD), 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   Vitamin D  deficiency 01/12/2016   B12 deficiency 01/12/2016   Status post arthroscopy of hip 12/25/2015   Left hip pain 03/04/2015   Labral tear of hip, degenerative 02/04/2015   Quadriceps tendinitis 01/06/2015   Femoral neck stress fracture 01/06/2015   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/06/2015   Cephalalgia 03/12/2014    Past Surgical History:  Procedure Laterality Date   ADENOIDECTOMY     COLONOSCOPY WITH PROPOFOL  N/A 03/23/2020   Procedure: COLONOSCOPY WITH PROPOFOL ;  Surgeon: Unk Corinn Skiff, MD;  Location: [facility];  Service: Gastroenterology;  Laterality: N/A;   HIP SURGERY     HARDWARE REMOVAL X2   PERIACETUBLAR Bilateral  OSTEOTOMY   TONSILLECTOMY     TYMPANOSTOMY TUBE PLACEMENT      OB History     Gravida  1   Para  0   Term  0   Preterm  0   AB  1   Living  0      SAB  1   IAB  0   Ectopic  0   Multiple  0   Live Births  0            Home Medications    Prior to Admission medications   Medication Sig Start Date End Date Taking? Authorizing Provider  ARIPiprazole (ABILIFY) 5 MG tablet Take 5 mg by mouth daily. 09/01/23  Yes [provider]  nitrofurantoin , macrocrystal-monohydrate, (MACROBID ) 100 MG capsule Take 1 capsule (100 mg total) by mouth  2 (two) times daily. 09/13/23  Yes Theophile Harvie, DO  Adapalene  (DIFFERIN ) 0.3 % gel Apply to face at bedtime, wash off in morning. 01/16/23   Claudene Lehmann, MD  albuterol  (VENTOLIN  HFA) 108 (90 Base) MCG/ACT inhaler Inhale 2 puffs into the lungs every 6 (six) hours as needed for wheezing or shortness of breath. 01/08/20   Tonna Redell SQUIBB, NP  AZSTARYS 39.2-7.8 MG CAPS Take by mouth. Patient not taking: Reported on 05/12/2023    [provider]  busPIRone  (BUSPAR ) 15 MG tablet Take 20 mg by mouth in the morning, at noon, and at bedtime. 08/04/22   [provider]  cetirizine (ZYRTEC) 10 MG tablet Take 10 mg by mouth daily. 04/17/17   [provider]  clobetasol  (TEMOVATE ) 0.05 % external solution Apply to aa's scalp QD PRN. Avoid the face, groin, and underarms. Patient not taking: Reported on 05/12/2023 01/18/22   Moye, Virginia , MD  clotrimazole  (LOTRIMIN  AF) 1 % cream Apply 1 Application topically 2 (two) times daily. 04/29/23   Floy Roberts, MD  cyanocobalamin  (,VITAMIN B-12,) 1000 MCG/ML injection INJECT 1 ML (1,000 MCG TOTAL) INTO THE MUSCLE EVERY 30 (THIRTY) DAYS. 12/11/17   Shambley, Melody N, CNM  fluticasone  (FLONASE ) 50 MCG/ACT nasal spray Place 1 spray into both nostrils 2 (two) times daily as needed for allergies. 02/12/20   Luke Orlan HERO, DO  gabapentin  (NEURONTIN ) 400 MG capsule Take 400 mg by mouth 2 (two) times daily.    [provider]  Galcanezumab-gnlm (EMGALITY) 120 MG/ML SOAJ Inject into the skin. 01/20/20   [provider]  montelukast  (SINGULAIR ) 10 MG tablet Take 10 mg by mouth at bedtime. 09/21/21   [provider]  ondansetron  (ZOFRAN -ODT) 4 MG disintegrating tablet Take 1 tablet (4 mg total) by mouth every 8 (eight) hours as needed for nausea or vomiting. 01/02/23   Sung, Jade J, MD  pentosan polysulfate (ELMIRON) 100 MG capsule Take 100 mg by mouth in the morning, at noon, and at bedtime. 02/20/23   [provider]   prazosin (MINIPRESS) 1 MG capsule Take 2 mg by mouth daily.    [provider]  propranolol (INDERAL) 20 MG tablet Take 20 mg by mouth 2 (two) times daily.    [provider]  QELBREE 200 MG 24 hr capsule Take 400 mg by mouth daily. 08/09/22   [provider]  rizatriptan  (MAXALT -MLT) 10 MG disintegrating tablet Take 10 mg by mouth as directed. TAKE 1 TAB AT HEADACHE ONSET MAY TAKE A 2ND DOSE AFTER 2 HOURS IF NEEDED NO MORE THAN 2 DOSES/24 HRS 04/25/17   [provider]  sertraline  (ZOLOFT ) 100 MG tablet  Take 150 mg by mouth daily. 09/07/21   [provider]  valACYclovir  (VALTREX ) 500 MG tablet Take 1 tablet (500 mg total) by mouth 2 (two) times daily. Patient taking differently: Take 500 mg by mouth 2 (two) times daily as needed (Cold Sores). 02/09/22   Justino Eleanor HERO, CNM    Family History Family History  Problem Relation Age of Onset   Diabetes Mellitus II Father    Colon cancer Father    Melanoma Maternal Grandmother    Lung cancer Paternal Grandfather     Social History Social History   Tobacco Use   Smoking status: Never    Passive exposure: Never   Smokeless tobacco: Never  Vaping Use   Vaping status: Never Used  Substance Use Topics   Alcohol use: Yes    Alcohol/week: 0.0 standard drinks of alcohol    Comment: occas   Drug use: No     Allergies   Doxycycline, Amoxicillin-pot clavulanate, and Penicillins   Review of Systems Review of Systems: :negative unless otherwise stated in HPI.      Physical Exam Triage Vital Signs ED Triage Vitals  Encounter Vitals Group     BP 09/13/23 0823 113/80     Girls Systolic BP Percentile --      Girls Diastolic BP Percentile --      Boys Systolic BP Percentile --      Boys Diastolic BP Percentile --      Pulse Rate 09/13/23 0823 92     Resp 09/13/23 0823 16     Temp 09/13/23 0823 97.8 F (36.6 C)     Temp Source 09/13/23 0823 Oral     SpO2 09/13/23 0823 94 %     Weight  09/13/23 0822 190 lb (86.2 kg)     Height 09/13/23 0822 5' 3 (1.6 m)     Head Circumference --      Peak Flow --      Pain Score 09/13/23 0827 8     Pain Loc --      Pain Education --      Exclude from Growth Chart --    No data found.  Updated Vital Signs BP 113/80 (BP Location: Right Arm)   Pulse 92   Temp 97.8 F (36.6 C) (Oral)   Resp 16   Ht 5' 3 (1.6 m)   Wt 86.2 kg   LMP 09/07/2023 (Approximate)   SpO2 94%   BMI 33.66 kg/m   Visual Acuity Right Eye Distance:   Left Eye Distance:   Bilateral Distance:    Right Eye Near:   Left Eye Near:    Bilateral Near:     Physical Exam GEN: well appearing female in no acute distress  CVS: well perfused  RESP: speaking in full sentences without pause  ABD: soft, +lower abdominal tenderness, non-distended, no palpable masses, right CVA tenderness   SKIN: warm, dry   UC Treatments / Results  Labs (all labs ordered are listed, but only abnormal results are displayed) Labs Reviewed  URINALYSIS, W/ REFLEX TO CULTURE (INFECTION SUSPECTED) - Abnormal; Notable for the following components:      Result Value   Color, Urine ORANGE (*)    APPearance HAZY (*)    Glucose, UA   (*)    Value: TEST NOT REPORTED DUE TO COLOR INTERFERENCE OF URINE PIGMENT   Hgb urine dipstick   (*)    Value: TEST NOT REPORTED DUE TO COLOR INTERFERENCE OF URINE PIGMENT  Bilirubin Urine   (*)    Value: TEST NOT REPORTED DUE TO COLOR INTERFERENCE OF URINE PIGMENT   Ketones, ur   (*)    Value: TEST NOT REPORTED DUE TO COLOR INTERFERENCE OF URINE PIGMENT   Protein, ur   (*)    Value: TEST NOT REPORTED DUE TO COLOR INTERFERENCE OF URINE PIGMENT   Nitrite   (*)    Value: TEST NOT REPORTED DUE TO COLOR INTERFERENCE OF URINE PIGMENT   Leukocytes,Ua   (*)    Value: TEST NOT REPORTED DUE TO COLOR INTERFERENCE OF URINE PIGMENT   Bacteria, UA MANY (*)    All other components within normal limits  URINE CULTURE    EKG   Radiology No results  found.  Procedures Procedures (including critical care time)  Medications Ordered in UC Medications - No data to display  Initial Impression / Assessment and Plan / UC Course  I have reviewed the triage vital signs and the nursing notes.  Pertinent labs & imaging results that were available during my care of the patient were reviewed by me and considered in my medical decision making (see chart for details).         Patient is a 28 y.o. female  who presents for 1 day of dysuria, lower abdominal pain and urinary frequency.  Overall patient is well-appearing and afebrile.  Vital signs stable.  Urinalysis not reliable due to AZO use however microscopy concerning for acute cystitis.  Treat with Macrobid  2 times daily for 5 days.  Urine culture obtained.  Follow-up sensitivities and stop/change antibiotics, if needed. Declined STI testing.   Return precautions including abdominal pain, fever, chills, nausea, or vomiting given. Follow-up,  if symptoms not improving or getting worse. Discussed MDM, treatment plan and plan for follow-up with patient who agrees with plan.        Final Clinical Impressions(s) / UC Diagnoses   Final diagnoses:  Chronic interstitial cystitis  Recurrent UTI (urinary tract infection)  Dysuria     Discharge Instructions      Under the microscope it appears your could have a urinary tract infection. I sent your urine for culture to be sure the antibiotic prescribed will treat your potential infection. Someone may call you to stop or change antibiotics. Stop by the pharmacy to pick up your prescription.  Follow up with your gynecologist, urologist or return to the urgent care, if not improving.         ED Prescriptions     Medication Sig Dispense Auth. Provider   nitrofurantoin , macrocrystal-monohydrate, (MACROBID ) 100 MG capsule Take 1 capsule (100 mg total) by mouth 2 (two) times daily. 10 capsule Maysen Sudol, DO      PDMP not reviewed  this encounter.   Pailynn Vahey, DO 09/13/23 [phone]

## 2023-09-13 NOTE — ED Triage Notes (Signed)
 Pt c/o urinary freq,burning & pain x1 day. States recently returned from the beach. Denies any hematuria. Has tried AZO w/o relief.

## 2023-09-15 ENCOUNTER — Ambulatory Visit (HOSPITAL_COMMUNITY): Payer: Self-pay

## 2023-09-15 LAB — URINE CULTURE: Culture: >=100000 — AB

## 2023-09-25 ENCOUNTER — Ambulatory Visit
Admission: EM | Admit: 2023-09-25 | Discharge: 2023-09-25 | Disposition: A | Discharge status: Home / Self Care | Attending: Emergency Medicine | Admitting: Emergency Medicine

## 2023-09-25 DIAGNOSIS — N390 Urinary tract infection, site not specified: Secondary | ICD-10-CM | POA: Insufficient documentation

## 2023-09-25 DIAGNOSIS — Z113 Encounter for screening for infections with a predominantly sexual mode of transmission: Secondary | ICD-10-CM | POA: Insufficient documentation

## 2023-09-25 DIAGNOSIS — R350 Frequency of micturition: Secondary | ICD-10-CM | POA: Diagnosis present

## 2023-09-25 DIAGNOSIS — R300 Dysuria: Secondary | ICD-10-CM | POA: Diagnosis present

## 2023-09-25 LAB — URINALYSIS, W/ REFLEX TO CULTURE (INFECTION SUSPECTED)
Bilirubin Urine: NEGATIVE
Glucose, UA: NEGATIVE mg/dL
Ketones, ur: NEGATIVE mg/dL
Nitrite: NEGATIVE
Protein, ur: NEGATIVE mg/dL
Specific Gravity, Urine: 1.015 (ref 1.005–1.030)
WBC, UA: >50 WBC/hpf (ref 0–5)
pH: 7 (ref 5.0–8.0)

## 2023-09-25 MED ORDER — SULFAMETHOXAZOLE-TRIMETHOPRIM 800-160 MG PO TABS
1.0000 | ORAL_TABLET | Freq: Two times a day (BID) | ORAL | 0 refills | Status: AC
Start: 2023-09-25 — End: 2023-10-02

## 2023-09-25 MED ORDER — PHENAZOPYRIDINE HCL 200 MG PO TABS: 200.0000 mg | ORAL_TABLET | Freq: Three times a day (TID) | ORAL | 0 refills | Status: AC

## 2023-09-25 NOTE — ED Provider Notes (Signed)
 [facility]    CSN: [id number] Arrival date & time: 09/25/23  1311      History   Chief Complaint Chief Complaint  Patient presents with   Urinary Frequency    HPI CORINTHIA HELMERS is a 28 y.o. female.   HPI  28 year old female with past medical history significant for pelvic congestion syndrome, interstitial cystitis, migraine headaches, GERD, asthma, anxiety, ADHD, B12 deficiency, Raynaud's phenomenon, and recurrent UTIs presents for evaluation of dysuria and urinary urgency that have been going on for the past week.  She denies hematuria, fever, nausea or vomiting, vaginal discharge, vaginal itching.  Was recently treated for UTI on 09/13/2023.  Past Medical History:  Diagnosis Date   ADHD    Anxiety    Asthma    B12 deficiency    Femoral neck stress fracture    GERD (gastroesophageal reflux disease)    Hip dysplasia    History of dysplastic nevus 03/04/2020   right upper back, severe/excision   Migraine    Pelvic congestion syndrome    Pyelonephritis    Recurrent upper respiratory infection (URI)    Urticaria     Patient Active Problem List   Diagnosis Date Noted   IC (interstitial cystitis) 02/20/2023   Recurrent UTI 09/23/2022   ESBL (extended spectrum beta-lactamase) producing bacteria infection 09/20/2022   Iron deficiency 08/22/2022   Raynaud's syndrome without gangrene 08/22/2022   Positive ANA (antinuclear antibody) 07/25/2022   Facial rash 07/25/2022   Other fatigue 07/25/2022   Frequent infections 07/25/2022   Obesity (BMI 30-39.9) 03/09/2022   Family history of colon cancer 08/02/2021   Sprain of left ankle 01/18/2021   Pain from implanted hardware 03/23/2020   History of colon cancer    Asthma, not well controlled 02/12/2020   History of frequent upper respiratory infection 02/12/2020   Adverse reaction to food, subsequent encounter 02/12/2020   Drug reaction 02/12/2020   Heartburn 02/12/2020   History of systemic reaction to  hymenoptera sting 02/12/2020   History of urticaria 02/12/2020   Numbness and tingling in left arm 01/16/2020   Other allergic rhinitis 01/08/2020   Anemia 12/03/2018   Migraine without aura and without status migrainosus, not intractable 08/23/2017   Generalized anxiety disorder 05/22/2017   Other acquired deformity of hip 05/22/2017   Pyelonephritis 05/17/2017   Attention deficit hyperactivity disorder (ADHD), 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   Vitamin D  deficiency 01/12/2016   Cobalamin deficiency 01/12/2016   Status post arthroscopy of hip 12/25/2015   Left hip pain 03/04/2015   Labral tear of hip, degenerative 02/04/2015   Quadriceps tendinitis 01/06/2015   Femoral neck stress fracture 01/06/2015   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/06/2015   Cephalalgia 03/12/2014    Past Surgical History:  Procedure Laterality Date   ADENOIDECTOMY     COLONOSCOPY WITH PROPOFOL  N/A 03/23/2020   Procedure: COLONOSCOPY WITH PROPOFOL ;  Surgeon: Unk Corinn Skiff, MD;  Location: [facility] ENDOSCOPY;  Service: Gastroenterology;  Laterality: N/A;   HIP SURGERY     HARDWARE REMOVAL X2   PERIACETUBLAR Bilateral    OSTEOTOMY   TONSILLECTOMY     TYMPANOSTOMY TUBE PLACEMENT      OB History     Gravida  1   Para  0   Term  0   Preterm  0   AB  1   Living  0      SAB  1   IAB  0   Ectopic  0  Multiple  0   Live Births  0            Home Medications    Prior to Admission medications   Medication Sig Start Date End Date Taking? Authorizing Provider  phenazopyridine  (PYRIDIUM ) 200 MG tablet Take 1 tablet (200 mg total) by mouth 3 (three) times daily. 09/25/23  Yes Bernardino Ditch, NP  sulfamethoxazole -trimethoprim  (BACTRIM  DS) 800-160 MG tablet Take 1 tablet by mouth 2 (two) times daily for 7 days. 09/25/23 10/02/23 Yes Bernardino Ditch, NP  Adapalene  (DIFFERIN ) 0.3 % gel Apply to face at bedtime, wash off in morning. 01/16/23   Claudene Lehmann, MD  albuterol  (VENTOLIN   HFA) 108 (90 Base) MCG/ACT inhaler Inhale 2 puffs into the lungs every 6 (six) hours as needed for wheezing or shortness of breath. 01/08/20   Tonna Redell SQUIBB, NP  ARIPiprazole (ABILIFY) 5 MG tablet Take 5 mg by mouth daily. 09/01/23   [provider]  AZSTARYS 39.2-7.8 MG CAPS Take by mouth. Patient not taking: Reported on 05/12/2023    [provider]  busPIRone  (BUSPAR ) 15 MG tablet Take 20 mg by mouth in the morning, at noon, and at bedtime. 08/04/22   [provider]  cetirizine (ZYRTEC) 10 MG tablet Take 10 mg by mouth daily. 04/17/17   [provider]  clobetasol  (TEMOVATE ) 0.05 % external solution Apply to aa's scalp QD PRN. Avoid the face, groin, and underarms. Patient not taking: Reported on 05/12/2023 01/18/22   Moye, Virginia , MD  clotrimazole  (LOTRIMIN  AF) 1 % cream Apply 1 Application topically 2 (two) times daily. 04/29/23   Floy Roberts, MD  cyanocobalamin  (,VITAMIN B-12,) 1000 MCG/ML injection INJECT 1 ML (1,000 MCG TOTAL) INTO THE MUSCLE EVERY 30 (THIRTY) DAYS. 12/11/17   Shambley, Melody N, CNM  fluticasone  (FLONASE ) 50 MCG/ACT nasal spray Place 1 spray into both nostrils 2 (two) times daily as needed for allergies. 02/12/20   Luke Orlan HERO, DO  gabapentin  (NEURONTIN ) 400 MG capsule Take 400 mg by mouth 2 (two) times daily.    [provider]  Galcanezumab-gnlm (EMGALITY) 120 MG/ML SOAJ Inject into the skin. 01/20/20   [provider]  montelukast  (SINGULAIR ) 10 MG tablet Take 10 mg by mouth at bedtime. 09/21/21   [provider]  ondansetron  (ZOFRAN -ODT) 4 MG disintegrating tablet Take 1 tablet (4 mg total) by mouth every 8 (eight) hours as needed for nausea or vomiting. 01/02/23   Sung, Jade J, MD  pentosan polysulfate (ELMIRON) 100 MG capsule Take 100 mg by mouth in the morning, at noon, and at bedtime. 02/20/23   [provider]  prazosin (MINIPRESS) 1 MG capsule Take 2 mg by mouth daily.    [provider]   propranolol (INDERAL) 20 MG tablet Take 20 mg by mouth 2 (two) times daily.    [provider]  QELBREE 200 MG 24 hr capsule Take 400 mg by mouth daily. 08/09/22   [provider]  rizatriptan  (MAXALT -MLT) 10 MG disintegrating tablet Take 10 mg by mouth as directed. TAKE 1 TAB AT HEADACHE ONSET MAY TAKE A 2ND DOSE AFTER 2 HOURS IF NEEDED NO MORE THAN 2 DOSES/24 HRS 04/25/17   [provider]  sertraline  (ZOLOFT ) 100 MG tablet Take 150 mg by mouth daily. 09/07/21   [provider]  valACYclovir  (VALTREX ) 500 MG tablet Take 1 tablet (500 mg total) by mouth 2 (two) times daily. Patient taking differently: Take 500 mg by mouth 2 (two) times daily as needed (Cold Sores).  02/09/22   Justino Eleanor HERO, CNM    Family History Family History  Problem Relation Age of Onset   Diabetes Mellitus II Father    Colon cancer Father    Melanoma Maternal Grandmother    Lung cancer Paternal Grandfather     Social History Social History   Tobacco Use   Smoking status: Never    Passive exposure: Never   Smokeless tobacco: Never  Vaping Use   Vaping status: Never Used  Substance Use Topics   Alcohol use: Yes    Alcohol/week: 0.0 standard drinks of alcohol    Comment: occas   Drug use: No     Allergies   Doxycycline, Amoxicillin-pot clavulanate, and Penicillins   Review of Systems Review of Systems  Constitutional:  Negative for fever.  Gastrointestinal:  Negative for abdominal pain, nausea and vomiting.  Genitourinary:  Positive for dysuria, frequency and urgency. Negative for hematuria, vaginal discharge and vaginal pain.  Musculoskeletal:  Negative for back pain.     Physical Exam Triage Vital Signs ED Triage Vitals  Encounter Vitals Group     BP      Girls Systolic BP Percentile      Girls Diastolic BP Percentile      Boys Systolic BP Percentile      Boys Diastolic BP Percentile      Pulse      Resp      Temp      Temp src      SpO2       Weight      Height      Head Circumference      Peak Flow      Pain Score      Pain Loc      Pain Education      Exclude from Growth Chart    No data found.  Updated Vital Signs BP 113/77 (BP Location: Right Arm)   Pulse 81   Temp 98.7 F (37.1 C) (Oral)   Resp 16   Ht 5' 3 (1.6 m)   Wt 209 lb 1.6 oz (94.8 kg)   LMP 09/07/2023 (Approximate)   SpO2 98%   BMI 37.04 kg/m   Visual Acuity Right Eye Distance:   Left Eye Distance:   Bilateral Distance:    Right Eye Near:   Left Eye Near:    Bilateral Near:     Physical Exam Vitals and nursing note reviewed.  Constitutional:      Appearance: Normal appearance. She is not ill-appearing.  HENT:     Head: Normocephalic and atraumatic.  Cardiovascular:     Rate and Rhythm: Normal rate and regular rhythm.     Pulses: Normal pulses.     Heart sounds: Normal heart sounds. No murmur heard.    No friction rub. No gallop.  Pulmonary:     Effort: Pulmonary effort is normal.     Breath sounds: Normal breath sounds. No wheezing, rhonchi or rales.  Abdominal:     Palpations: Abdomen is soft.     Tenderness: There is no abdominal tenderness. There is no right CVA tenderness, left CVA tenderness, guarding or rebound.  Skin:    General: Skin is warm and dry.     Capillary Refill: Capillary refill takes less than 2 seconds.     Findings: No rash.  Neurological:     General: No focal deficit present.     Mental Status: She is alert and oriented to person, place, and time.  UC Treatments / Results  Labs (all labs ordered are listed, but only abnormal results are displayed) Labs Reviewed  URINALYSIS, W/ REFLEX TO CULTURE (INFECTION SUSPECTED) - Abnormal; Notable for the following components:      Result Value   APPearance HAZY (*)    Hgb urine dipstick TRACE (*)    Leukocytes,Ua LARGE (*)    Non Squamous Epithelial PRESENT (*)    Bacteria, UA MANY (*)    All other components within normal limits  URINE CULTURE   CERVICOVAGINAL ANCILLARY ONLY    EKG   Radiology No results found.  Procedures Procedures (including critical care time)  Medications Ordered in UC Medications - No data to display  Initial Impression / Assessment and Plan / UC Course  I have reviewed the triage vital signs and the nursing notes.  Pertinent labs & imaging results that were available during my care of the patient were reviewed by me and considered in my medical decision making (see chart for details).   Patient is a very pleasant, nontoxic-appearing 28 year old female presenting for evaluation of ongoing UTI symptoms.  The patient was seen in this urgent care on 09/13/2023 and diagnosed with a UTI.  She was treated with a 5-day course of Macrobid .  She reports that her symptoms have mostly resolved but did not completely resolve.  She thought it might be attributed to an IC flare as a result of her UTI.  Her urine culture grew out E. coli which was resistant to Bactrim  and ampicillin but was sensitive to nitrofurantoin .  She has been treated with nitrofurantoin  for the UTI.  Differential diagnosis includes recurrent UTI, IC flare, bacterial vaginosis or vaginal yeast infection as a result of recent antibiotic therapy.  I will order a UA as well as a vaginal cytology swab.  Urinalysis has a hazy appearance with trace hemoglobin large leukocyte esterase.  Negative for nitrites, protein, or glucose.  Non-squamous epithelials are present with greater than 50 WBCs, many bacteria, and WBC clumps.  Urine will reflex to culture.  I will discharge patient on the diagnosis of UTI and start her on Bactrim  DS twice daily for 7 days for treatment of her UTI.  Also Pyridium  every 8 hours help with urinary discomfort.  Vaginal cytology swab is negative for BV or yeast.   Final Clinical Impressions(s) / UC Diagnoses   Final diagnoses:  Lower urinary tract infectious disease     Discharge Instructions      Take the Bactrim  DS  twice daily for 7 days with a full glass of water for treatment of urinary tract infection.  Use the Pyridium  every 8 hours as needed for urinary discomfort.  This will turn your urine a bright red-orange.  Increase your oral fluid intake so that you increase your urine production and or flushing your urinary system.  Take an over-the-counter probiotic, such as Culturelle-Align-Activia, 1 hour after each dose of antibiotic to prevent diarrhea or yeast infections from forming.  We will culture urine and change the antibiotics if necessary.  Return for reevaluation, or see your primary care provider, for any new or worsening symptoms.      ED Prescriptions     Medication Sig Dispense Auth. Provider   sulfamethoxazole -trimethoprim  (BACTRIM  DS) 800-160 MG tablet Take 1 tablet by mouth 2 (two) times daily for 7 days. 14 tablet Bernardino Ditch, NP   phenazopyridine  (PYRIDIUM ) 200 MG tablet Take 1 tablet (200 mg total) by mouth 3 (three) times daily. 6 tablet Bernardino,  Venetia, NP      PDMP not reviewed this encounter.   Bernardino Venetia, NP 09/25/23 1429    Bernardino Venetia, NP 09/26/23 1310

## 2023-09-25 NOTE — ED Triage Notes (Signed)
 Pt c/o ongoing urinary freq & burning x1 wk. Denies any hematuria. Was seen here on 9/3 for the same issue. Given macrobid  w/relief.

## 2023-09-25 NOTE — Discharge Instructions (Addendum)
 Take the Bactrim DS twice daily for 7 days with a full glass of water for treatment of urinary tract infection.  Use the Pyridium every 8 hours as needed for urinary discomfort.  This will turn your urine a bright red-orange.  Increase your oral fluid intake so that you increase your urine production and or flushing your urinary system.  Take an over-the-counter probiotic, such as Culturelle-Align-Activia, 1 hour after each dose of antibiotic to prevent diarrhea or yeast infections from forming.  We will culture urine and change the antibiotics if necessary.  Return for reevaluation, or see your primary care provider, for any new or worsening symptoms.

## 2023-09-26 LAB — CERVICOVAGINAL ANCILLARY ONLY
Bacterial Vaginitis (gardnerella): NEGATIVE
Candida Glabrata: NEGATIVE
Candida Vaginitis: NEGATIVE
Comment: NEGATIVE
Comment: NEGATIVE
Comment: NEGATIVE

## 2023-09-28 ENCOUNTER — Ambulatory Visit (HOSPITAL_COMMUNITY): Payer: Self-pay

## 2023-09-28 LAB — URINE CULTURE: Culture: >=100000 — AB

## 2023-09-28 MED ORDER — NITROFURANTOIN MONOHYD MACRO 100 MG PO CAPS: 100.0000 mg | ORAL_CAPSULE | Freq: Two times a day (BID) | ORAL | 0 refills | Status: AC

## 2024-01-12 ENCOUNTER — Other Ambulatory Visit: Payer: Self-pay

## 2024-01-12 DIAGNOSIS — N898 Other specified noninflammatory disorders of vagina: Secondary | ICD-10-CM

## 2024-01-12 MED ORDER — FLUCONAZOLE 150 MG PO TABS: 150.0000 mg | ORAL_TABLET | Freq: Once | ORAL | 1 refills | Status: AC

## 2024-01-12 NOTE — Progress Notes (Signed)
 Patient called to report vaginal itching and thick white discharge.  She reports a history of numerous yeast infections and feels certain about her symptoms.  As we have previously prescribed it before, per clinic protocol, Diflucan  sent to [facility] in [place].

## 2024-01-16 ENCOUNTER — Ambulatory Visit: Payer: 59 | Admitting: Dermatology

## 2024-03-11 ENCOUNTER — Ambulatory Visit: Admitting: Obstetrics
